# Patient Record
Sex: Female | Born: 1950
Health system: Southern US, Community
[De-identification: ages and names within clinical notes are randomized; demographics above are authoritative.]

## PROBLEM LIST (undated history)

## (undated) DIAGNOSIS — M25562 Pain in left knee: Secondary | ICD-10-CM

## (undated) DIAGNOSIS — M25579 Pain in unspecified ankle and joints of unspecified foot: Secondary | ICD-10-CM

## (undated) DIAGNOSIS — M76899 Other specified enthesopathies of unspecified lower limb, excluding foot: Secondary | ICD-10-CM

## (undated) DIAGNOSIS — E785 Hyperlipidemia, unspecified: Secondary | ICD-10-CM

## (undated) DIAGNOSIS — D696 Thrombocytopenia, unspecified: Secondary | ICD-10-CM

## (undated) DIAGNOSIS — R202 Paresthesia of skin: Secondary | ICD-10-CM

## (undated) DIAGNOSIS — M79671 Pain in right foot: Secondary | ICD-10-CM

## (undated) DIAGNOSIS — M84369A Stress fracture, unspecified tibia and fibula, initial encounter for fracture: Secondary | ICD-10-CM

## (undated) DIAGNOSIS — C50919 Malignant neoplasm of unspecified site of unspecified female breast: Secondary | ICD-10-CM

## (undated) DIAGNOSIS — M79609 Pain in unspecified limb: Secondary | ICD-10-CM

## (undated) DIAGNOSIS — M2170 Unequal limb length (acquired), unspecified site: Secondary | ICD-10-CM

## (undated) DIAGNOSIS — M84350A Stress fracture, pelvis, initial encounter for fracture: Secondary | ICD-10-CM

## (undated) DIAGNOSIS — M7661 Achilles tendinitis, right leg: Secondary | ICD-10-CM

## (undated) DIAGNOSIS — T148XXA Other injury of unspecified body region, initial encounter: Secondary | ICD-10-CM

## (undated) DIAGNOSIS — H9319 Tinnitus, unspecified ear: Secondary | ICD-10-CM

## (undated) DIAGNOSIS — J4520 Mild intermittent asthma, uncomplicated: Secondary | ICD-10-CM

## (undated) DIAGNOSIS — H905 Unspecified sensorineural hearing loss: Secondary | ICD-10-CM

## (undated) DIAGNOSIS — M25561 Pain in right knee: Secondary | ICD-10-CM

## (undated) DIAGNOSIS — M7650 Patellar tendinitis, unspecified knee: Secondary | ICD-10-CM

## (undated) DIAGNOSIS — S86809A Unspecified injury of other muscle(s) and tendon(s) at lower leg level, unspecified leg, initial encounter: Secondary | ICD-10-CM

## (undated) DIAGNOSIS — C4490 Unspecified malignant neoplasm of skin, unspecified: Secondary | ICD-10-CM

## (undated) DIAGNOSIS — M2040 Other hammer toe(s) (acquired), unspecified foot: Secondary | ICD-10-CM

## (undated) DIAGNOSIS — J309 Allergic rhinitis, unspecified: Secondary | ICD-10-CM

## (undated) DIAGNOSIS — M76829 Posterior tibial tendinitis, unspecified leg: Secondary | ICD-10-CM

## (undated) DIAGNOSIS — E7800 Pure hypercholesterolemia, unspecified: Secondary | ICD-10-CM

## (undated) DIAGNOSIS — G4700 Insomnia, unspecified: Secondary | ICD-10-CM

## (undated) DIAGNOSIS — M84376A Stress fracture, unspecified foot, initial encounter for fracture: Secondary | ICD-10-CM

## (undated) DIAGNOSIS — R200 Anesthesia of skin: Secondary | ICD-10-CM

## (undated) DIAGNOSIS — R079 Chest pain, unspecified: Secondary | ICD-10-CM

## (undated) DIAGNOSIS — J45909 Unspecified asthma, uncomplicated: Secondary | ICD-10-CM

## (undated) DIAGNOSIS — M7582 Other shoulder lesions, left shoulder: Secondary | ICD-10-CM

## (undated) HISTORY — DX: Stress fracture, pelvis, initial encounter for fracture: M84.350A

## (undated) HISTORY — PX: MANDIBLE FRACTURE SURGERY: SHX706

## (undated) HISTORY — DX: Unspecified sensorineural hearing loss: H90.5

## (undated) HISTORY — DX: Other shoulder lesions, left shoulder: M75.82

## (undated) HISTORY — DX: Pain in unspecified ankle and joints of unspecified foot: M25.579

## (undated) HISTORY — DX: Chest pain, unspecified: R07.9

## (undated) HISTORY — PX: NASAL SINUS SURGERY: SHX719

## (undated) HISTORY — DX: Posterior tibial tendinitis, unspecified leg: M76.829

## (undated) HISTORY — PX: TONSILLECTOMY: SUR1361

## (undated) HISTORY — PX: CATARACT EXTRACTION: SUR2

## (undated) HISTORY — DX: Other specified enthesopathies of unspecified lower limb, excluding foot: M76.899

## (undated) HISTORY — DX: Other injury of unspecified body region, initial encounter: T14.8XXA

## (undated) HISTORY — DX: Anesthesia of skin: R20.0

## (undated) HISTORY — DX: Hyperlipidemia, unspecified: E78.5

## (undated) HISTORY — DX: Stress fracture, unspecified foot, initial encounter for fracture: M84.376A

## (undated) HISTORY — DX: Patellar tendinitis, unspecified knee: M76.50

## (undated) HISTORY — DX: Pain in right foot: M79.671

## (undated) HISTORY — PX: ABDOMINAL SURGERY: SHX537

## (undated) HISTORY — DX: Unspecified injury of other muscle(s) and tendon(s) at lower leg level, unspecified leg, initial encounter: S86.809A

## (undated) HISTORY — DX: Achilles tendinitis, right leg: M76.61

## (undated) HISTORY — DX: Unspecified malignant neoplasm of skin, unspecified: C44.90

## (undated) HISTORY — DX: Thrombocytopenia, unspecified: D69.6

## (undated) HISTORY — DX: Insomnia, unspecified: G47.00

## (undated) HISTORY — DX: Tinnitus, unspecified ear: H93.19

## (undated) HISTORY — DX: Stress fracture, unspecified tibia and fibula, initial encounter for fracture: M84.369A

## (undated) HISTORY — PX: KNEE SURGERY: SHX244

## (undated) HISTORY — DX: Unspecified asthma, uncomplicated: J45.909

## (undated) HISTORY — DX: Allergic rhinitis, unspecified: J30.9

## (undated) HISTORY — DX: Pain in right knee: M25.561

## (undated) HISTORY — DX: Pain in unspecified limb: M79.609

## (undated) HISTORY — DX: Mild intermittent asthma, uncomplicated: J45.20

## (undated) HISTORY — DX: Unequal limb length (acquired), unspecified site: M21.70

## (undated) HISTORY — DX: Other hammer toe(s) (acquired), unspecified foot: M20.40

## (undated) HISTORY — DX: Pain in left knee: M25.562

## (undated) HISTORY — DX: Paresthesia of skin: R20.2

## (undated) HISTORY — DX: Malignant neoplasm of unspecified site of unspecified female breast: C50.919

---

## 1999-12-28 ENCOUNTER — Emergency Department (HOSPITAL_COMMUNITY): Admission: EM | Admit: 1999-12-28 | Discharge: 1999-12-28 | Payer: Self-pay | Admitting: *Deleted

## 1999-12-28 ENCOUNTER — Encounter: Payer: Self-pay | Admitting: Emergency Medicine

## 2002-08-13 ENCOUNTER — Encounter: Payer: Self-pay | Admitting: Obstetrics and Gynecology

## 2002-08-13 ENCOUNTER — Encounter: Admission: RE | Admit: 2002-08-13 | Discharge: 2002-08-13 | Payer: Self-pay | Admitting: Obstetrics and Gynecology

## 2003-01-29 ENCOUNTER — Other Ambulatory Visit: Admission: RE | Admit: 2003-01-29 | Discharge: 2003-01-29 | Payer: Self-pay | Admitting: Obstetrics and Gynecology

## 2003-02-26 ENCOUNTER — Encounter: Admission: RE | Admit: 2003-02-26 | Discharge: 2003-02-26 | Payer: Self-pay | Admitting: Obstetrics and Gynecology

## 2003-02-26 ENCOUNTER — Encounter: Payer: Self-pay | Admitting: Obstetrics and Gynecology

## 2004-02-16 ENCOUNTER — Other Ambulatory Visit: Admission: RE | Admit: 2004-02-16 | Discharge: 2004-02-16 | Payer: Self-pay | Admitting: Obstetrics and Gynecology

## 2004-02-27 ENCOUNTER — Encounter: Admission: RE | Admit: 2004-02-27 | Discharge: 2004-02-27 | Payer: Self-pay | Admitting: Obstetrics and Gynecology

## 2005-01-22 ENCOUNTER — Encounter: Admission: RE | Admit: 2005-01-22 | Discharge: 2005-01-22 | Payer: Self-pay | Admitting: Orthopedic Surgery

## 2005-03-08 ENCOUNTER — Encounter: Admission: RE | Admit: 2005-03-08 | Discharge: 2005-03-08 | Payer: Self-pay | Admitting: Obstetrics and Gynecology

## 2005-03-23 ENCOUNTER — Other Ambulatory Visit: Admission: RE | Admit: 2005-03-23 | Discharge: 2005-03-23 | Payer: Self-pay | Admitting: Obstetrics and Gynecology

## 2005-12-02 ENCOUNTER — Encounter: Admission: RE | Admit: 2005-12-02 | Discharge: 2005-12-02 | Payer: Self-pay | Admitting: Obstetrics and Gynecology

## 2006-03-23 ENCOUNTER — Encounter: Admission: RE | Admit: 2006-03-23 | Discharge: 2006-03-23 | Payer: Self-pay | Admitting: Obstetrics and Gynecology

## 2006-10-12 ENCOUNTER — Ambulatory Visit: Payer: Self-pay | Admitting: Family Medicine

## 2006-10-12 DIAGNOSIS — J45909 Unspecified asthma, uncomplicated: Secondary | ICD-10-CM

## 2006-10-12 DIAGNOSIS — J309 Allergic rhinitis, unspecified: Secondary | ICD-10-CM

## 2006-10-12 HISTORY — DX: Allergic rhinitis, unspecified: J30.9

## 2006-10-12 HISTORY — DX: Unspecified asthma, uncomplicated: J45.909

## 2006-10-13 DIAGNOSIS — M2040 Other hammer toe(s) (acquired), unspecified foot: Secondary | ICD-10-CM

## 2006-10-13 HISTORY — DX: Other hammer toe(s) (acquired), unspecified foot: M20.40

## 2006-10-18 ENCOUNTER — Ambulatory Visit: Payer: Self-pay | Admitting: Sports Medicine

## 2007-03-26 ENCOUNTER — Encounter: Admission: RE | Admit: 2007-03-26 | Discharge: 2007-03-26 | Payer: Self-pay | Admitting: Obstetrics and Gynecology

## 2007-03-29 ENCOUNTER — Encounter: Admission: RE | Admit: 2007-03-29 | Discharge: 2007-03-29 | Payer: Self-pay | Admitting: Obstetrics and Gynecology

## 2007-06-08 ENCOUNTER — Ambulatory Visit: Payer: Self-pay | Admitting: Sports Medicine

## 2007-06-08 ENCOUNTER — Ambulatory Visit (HOSPITAL_COMMUNITY): Admission: RE | Admit: 2007-06-08 | Discharge: 2007-06-08 | Payer: Self-pay | Admitting: Family Medicine

## 2007-06-13 ENCOUNTER — Telehealth (INDEPENDENT_AMBULATORY_CARE_PROVIDER_SITE_OTHER): Payer: Self-pay | Admitting: Family Medicine

## 2007-06-20 ENCOUNTER — Ambulatory Visit: Payer: Self-pay | Admitting: Sports Medicine

## 2007-07-17 ENCOUNTER — Ambulatory Visit: Payer: Self-pay | Admitting: Sports Medicine

## 2007-07-31 ENCOUNTER — Ambulatory Visit: Payer: Self-pay | Admitting: Sports Medicine

## 2007-08-03 ENCOUNTER — Ambulatory Visit (HOSPITAL_COMMUNITY): Admission: RE | Admit: 2007-08-03 | Discharge: 2007-08-03 | Payer: Self-pay | Admitting: Sports Medicine

## 2007-08-03 ENCOUNTER — Telehealth (INDEPENDENT_AMBULATORY_CARE_PROVIDER_SITE_OTHER): Payer: Self-pay | Admitting: *Deleted

## 2007-08-29 ENCOUNTER — Ambulatory Visit: Payer: Self-pay | Admitting: Sports Medicine

## 2007-08-29 DIAGNOSIS — M84350A Stress fracture, pelvis, initial encounter for fracture: Secondary | ICD-10-CM

## 2007-08-29 HISTORY — DX: Stress fracture, pelvis, initial encounter for fracture: M84.350A

## 2007-10-02 ENCOUNTER — Ambulatory Visit: Payer: Self-pay | Admitting: Sports Medicine

## 2007-10-10 ENCOUNTER — Ambulatory Visit: Payer: Self-pay | Admitting: Gastroenterology

## 2007-10-19 ENCOUNTER — Ambulatory Visit: Payer: Self-pay | Admitting: Gastroenterology

## 2007-10-30 ENCOUNTER — Ambulatory Visit: Payer: Self-pay | Admitting: Sports Medicine

## 2007-11-06 ENCOUNTER — Ambulatory Visit: Payer: Self-pay | Admitting: Sports Medicine

## 2007-11-22 ENCOUNTER — Telehealth: Payer: Self-pay | Admitting: Sports Medicine

## 2007-11-23 ENCOUNTER — Ambulatory Visit: Payer: Self-pay | Admitting: Sports Medicine

## 2007-12-12 ENCOUNTER — Ambulatory Visit: Payer: Self-pay | Admitting: Sports Medicine

## 2008-01-08 ENCOUNTER — Ambulatory Visit: Payer: Self-pay | Admitting: Sports Medicine

## 2008-01-22 ENCOUNTER — Emergency Department (HOSPITAL_COMMUNITY): Admission: EM | Admit: 2008-01-22 | Discharge: 2008-01-22 | Payer: Self-pay | Admitting: Family Medicine

## 2008-02-07 ENCOUNTER — Ambulatory Visit: Payer: Self-pay | Admitting: Sports Medicine

## 2008-02-07 DIAGNOSIS — C4490 Unspecified malignant neoplasm of skin, unspecified: Secondary | ICD-10-CM | POA: Insufficient documentation

## 2008-02-07 DIAGNOSIS — Z853 Personal history of malignant neoplasm of breast: Secondary | ICD-10-CM | POA: Insufficient documentation

## 2008-02-07 DIAGNOSIS — C50919 Malignant neoplasm of unspecified site of unspecified female breast: Secondary | ICD-10-CM | POA: Insufficient documentation

## 2008-02-07 HISTORY — DX: Unspecified malignant neoplasm of skin, unspecified: C44.90

## 2008-02-07 HISTORY — DX: Malignant neoplasm of unspecified site of unspecified female breast: C50.919

## 2008-03-11 ENCOUNTER — Ambulatory Visit: Payer: Self-pay | Admitting: Sports Medicine

## 2008-03-31 ENCOUNTER — Encounter: Admission: RE | Admit: 2008-03-31 | Discharge: 2008-03-31 | Payer: Self-pay | Admitting: Obstetrics and Gynecology

## 2008-04-07 ENCOUNTER — Ambulatory Visit: Payer: Self-pay | Admitting: Sports Medicine

## 2008-05-12 ENCOUNTER — Ambulatory Visit: Payer: Self-pay | Admitting: Sports Medicine

## 2008-06-05 ENCOUNTER — Ambulatory Visit: Payer: Self-pay | Admitting: Sports Medicine

## 2008-09-18 ENCOUNTER — Ambulatory Visit: Payer: Self-pay | Admitting: Sports Medicine

## 2008-09-18 DIAGNOSIS — M2170 Unequal limb length (acquired), unspecified site: Secondary | ICD-10-CM

## 2008-09-18 HISTORY — DX: Unequal limb length (acquired), unspecified site: M21.70

## 2008-10-20 ENCOUNTER — Emergency Department (HOSPITAL_BASED_OUTPATIENT_CLINIC_OR_DEPARTMENT_OTHER): Admission: EM | Admit: 2008-10-20 | Discharge: 2008-10-20 | Payer: Self-pay | Admitting: Emergency Medicine

## 2009-01-13 ENCOUNTER — Ambulatory Visit: Payer: Self-pay | Admitting: Sports Medicine

## 2009-04-01 ENCOUNTER — Encounter: Admission: RE | Admit: 2009-04-01 | Discharge: 2009-04-01 | Payer: Self-pay | Admitting: Obstetrics and Gynecology

## 2009-06-15 ENCOUNTER — Ambulatory Visit: Payer: Self-pay | Admitting: Sports Medicine

## 2009-06-15 ENCOUNTER — Encounter: Payer: Self-pay | Admitting: Sports Medicine

## 2009-06-15 DIAGNOSIS — M84369A Stress fracture, unspecified tibia and fibula, initial encounter for fracture: Secondary | ICD-10-CM

## 2009-06-15 HISTORY — DX: Stress fracture, unspecified tibia and fibula, initial encounter for fracture: M84.369A

## 2009-06-29 ENCOUNTER — Ambulatory Visit: Payer: Self-pay | Admitting: Sports Medicine

## 2009-07-15 ENCOUNTER — Encounter: Payer: Self-pay | Admitting: Sports Medicine

## 2009-07-15 ENCOUNTER — Ambulatory Visit: Payer: Self-pay | Admitting: Sports Medicine

## 2009-08-12 ENCOUNTER — Ambulatory Visit: Payer: Self-pay | Admitting: Sports Medicine

## 2009-08-12 DIAGNOSIS — M76829 Posterior tibial tendinitis, unspecified leg: Secondary | ICD-10-CM

## 2009-08-12 HISTORY — DX: Posterior tibial tendinitis, unspecified leg: M76.829

## 2009-09-22 ENCOUNTER — Ambulatory Visit: Payer: Self-pay | Admitting: Sports Medicine

## 2009-09-22 ENCOUNTER — Ambulatory Visit (HOSPITAL_COMMUNITY): Admission: RE | Admit: 2009-09-22 | Discharge: 2009-09-22 | Payer: Self-pay | Admitting: Sports Medicine

## 2009-09-22 DIAGNOSIS — M79609 Pain in unspecified limb: Secondary | ICD-10-CM

## 2009-09-22 HISTORY — DX: Pain in unspecified limb: M79.609

## 2009-09-23 ENCOUNTER — Encounter: Admission: RE | Admit: 2009-09-23 | Discharge: 2009-09-23 | Payer: Self-pay | Admitting: Sports Medicine

## 2009-09-23 ENCOUNTER — Encounter (INDEPENDENT_AMBULATORY_CARE_PROVIDER_SITE_OTHER): Payer: Self-pay | Admitting: *Deleted

## 2009-09-29 ENCOUNTER — Ambulatory Visit: Payer: Self-pay | Admitting: Sports Medicine

## 2009-10-20 ENCOUNTER — Ambulatory Visit: Payer: Self-pay | Admitting: Sports Medicine

## 2009-12-02 ENCOUNTER — Ambulatory Visit: Payer: Self-pay | Admitting: Sports Medicine

## 2010-04-05 ENCOUNTER — Encounter: Admission: RE | Admit: 2010-04-05 | Discharge: 2010-04-05 | Payer: Self-pay | Admitting: Obstetrics and Gynecology

## 2010-04-27 ENCOUNTER — Ambulatory Visit: Payer: Self-pay | Admitting: Sports Medicine

## 2010-04-27 DIAGNOSIS — M76899 Other specified enthesopathies of unspecified lower limb, excluding foot: Secondary | ICD-10-CM | POA: Insufficient documentation

## 2010-04-27 HISTORY — DX: Other specified enthesopathies of unspecified lower limb, excluding foot: M76.899

## 2010-05-25 ENCOUNTER — Ambulatory Visit
Admission: RE | Admit: 2010-05-25 | Discharge: 2010-05-25 | Payer: Self-pay | Source: Home / Self Care | Attending: Sports Medicine | Admitting: Sports Medicine

## 2010-05-30 ENCOUNTER — Encounter: Payer: Self-pay | Admitting: Sports Medicine

## 2010-06-08 NOTE — Miscellaneous (Signed)
Summary: BONE SCAN APPT  Clinical Lists Changes  Orders: Added new Test order of Radiology other (Radiology Other) - Signed

## 2010-06-08 NOTE — Assessment & Plan Note (Signed)
Summary: F/U,MC   Vital Signs:  Patient profile:   60 year old female BP sitting:   134 / 72  Vitals Entered By: Lillia Pauls CMA (August 12, 2009 8:48 AM)  Primary Provider:  Sports Medicine   History of Present Illness: Had early Tib stress fx 06/15/09 able to recover in 1 month with use of aircast went back to running fairly consistently [place] this past weekend and the tibia became a lot sorer but more over lat shin now and not area of previous stress fx pain started on 1 mile uphill section of race and was fairly severe w cramping  now sore over ant lat shin no limp to walk  Allergies: 1)  ! Aspirin (Aspirin) 2)  ! Nsaids 3)  * Acetaminophen  Physical Exam  General:  Well-developed,well-nourished,in no acute distress; alert,appropriate and cooperative throughout examination Msk:  Pain to day is located directly over RT  tib ant mm and perhaps  ext digitorum This is slightly tender to palpation and appears slt swollen vs Lt side  strength testing cause min discomfort  hop test is negative  percussion to tibia is neg  no pain at medial border now   Impression & Recommendations:  Problem # 1:  TIBIALIS TENDINITIS (ICD-726.72) seems priamrily ant tib tendonitis will give therabenad for rehab and instructed in walking rehab exercises  xtrain x 1 week ease back into running   no hills for first 2 wks  Problem # 2:  STRESS FRACTURE, TIBIA (ICD-733.93) This does not seem to be flared again at this point  with neg hop test can resume some running and training afer MM less sore  Complete Medication List: 1)  Tramadol Hcl 50 Mg Tabs (Tramadol hcl) .... Take one tablet every 4-6 hours as needed pain. 2)  Voltaren 1 % Gel (Diclofenac sodium) .... Apply 3 g to lateral knee 4 times a day; disp 100 g tube 3)  Evamist 1.53 Mg/spray Soln (Estradiol)  Appended Document: F/U,MC

## 2010-06-08 NOTE — Assessment & Plan Note (Signed)
Summary: F/U,MC   Vital Signs:  Patient profile:   60 year old female BP sitting:   120 / 74  Vitals Entered By: Lillia Pauls CMA (Sep 29, 2009 11:10 AM)  Primary Provider:  Sports Medicine   History of Present Illness: Now has taken off 9 days has seen improvement in past 2 days in leg pain some pain when she was on leg for 16 hours  Does not want to do bone scan  would rather try period of rest  notes some swelling in ankle but much less    Allergies: 1)  ! Aspirin (Aspirin) 2)  ! Nsaids 3)  * Acetaminophen  Physical Exam  General:  Well-developed,well-nourished,in no acute distress; alert,appropriate and cooperative throughout examination Msk:  nontender over tibia now x one area in mid ant tibia that has mild pain on percussion no swelling or discoloration in this area  RT ankle shows very slt swelling now in sinus tarsi  stable lateral and medial ligaments; squeeze test and kleiger test unremarkable; talar dome seems nontender; no sign of peroneal tendon subluxations; no pain at base of 5th MT.    Impression & Recommendations:  Problem # 1:  LEG PAIN, RIGHT (ICD-729.5) This still could be stress fx or stress rxn over bone  with norm xrays I am OK w period of rest  if she improves we can gradually return her to running  no tendinopathy noted on exam now  she likely needs a custom orthotic w recurrent leg pain and plan  to run a marathon in fall  Problem # 2:  FAMILY HISTORY OSTEOPOROSIS (ICD-V17.81) has very good T score and reviewed this w her  Complete Medication List: 1)  Tramadol Hcl 50 Mg Tabs (Tramadol hcl) .... Take one tablet every 4-6 hours as needed pain. 2)  Voltaren 1 % Gel (Diclofenac sodium) .... Apply 3 g to lateral knee 4 times a day; disp 100 g tube 3)  Evamist 1.53 Mg/spray Soln (Estradiol)  Patient Instructions: 1)  June 14 we reck 2)  if no bone related pain can staert running outdoors 3)  start back at run/ walk for first 2  wks 4)  now until then 5)  week 1 only bike, swim, elliptical 6)  week 2 OK to add TM if no pain - easy level 7)  still ice any sore areas 8)  in meantime - weight exercises 9)  runners squat 10)  runners lunge 11)  heel raise on step 12)  lateral and straight leg lifts

## 2010-06-08 NOTE — Assessment & Plan Note (Signed)
Summary: F/U,MC   Primary Provider:  Sports Medicine   History of Present Illness: Ran half marathon this past wk only able to go 2 miles before sharp pain in ankle swelled this had hurt a couple of months back but had not been troublesome since this area had not hurt enough to affect running before for past few mos has been walk running - 15 mins and then walk 2 able to finish 6 miles total on most runs  ant tib tendon did not hurt ant tibia higher is getting sharp pain body helix seems too tight over RT calf and shin  Allergies: 1)  ! Aspirin (Aspirin) 2)  ! Nsaids 3)  * Acetaminophen  Family History: Heart Disease in sister at age 94 Family History Diabetes 1st degree relative (Mo & Fa, onset in their 89's) Family History High cholesterol (Parents and Sister, onset in their 43's) High Blood pressure in Sister (onset in her 36's) Cancers in grandmother, mother sister  mother has severe osteoporosis and 2 sisters have this  Physical Exam  General:  Well-developed,well-nourished,in no acute distress; alert,appropriate and cooperative throughout examination Msk:  RT ant ankle swelling over sinus tarsi mild long arch collapse  ankle shows no swelling medially; stable lateral and medial ligaments; squeeze test and kleiger test unremarkable; talar dome seems nontender; no sign of peroneal tendon subluxations; no pain at base of 5th MT. resisted dorsiflexion w foot in Plantar flexion hurts laterally ankle dorsiflexion nuetrally non tender tib ant non tender    Impression & Recommendations:  Problem # 1:  LEG PAIN, RIGHT (ICD-729.5)  I think we need to xray tibia and RT ankle  also good to get BMD w strong hx osteoporosis  also hx of being on tamoxifen for breast Ca  relative rest until DX established  Orders: Radiology other (Radiology Other)  Problem # 2:  TIBIALIS TENDINITIS (ICD-726.72) this seems resolved testing  Problem # 3:  STRESS FRACTURE, TIBIA  (ICD-733.93)  i wonder if this completely healed  if xrays neg will follow w bone scan  Orders: Radiology other (Radiology Other) Radiology other (Radiology Other)  Complete Medication List: 1)  Tramadol Hcl 50 Mg Tabs (Tramadol hcl) .... Take one tablet every 4-6 hours as needed pain. 2)  Voltaren 1 % Gel (Diclofenac sodium) .... Apply 3 g to lateral knee 4 times a day; disp 100 g tube 3)  Evamist 1.53 Mg/spray Soln (Estradiol)

## 2010-06-08 NOTE — Assessment & Plan Note (Signed)
Summary: RT LEG POSSIABLE STRESS FRACTURE,MC   Primary Provider:  Sports Medicine   History of Present Illness: R anterior calf pain x1 week. will wake her up at night. worse with dress shoes. pain even with fast paced walking. has not increased mileage of runs. was running and felt sharp pain just distal to knee, and saw bruise afterwards. able to walk on it, but that is about it. about 3 days ago, pain was so severe it impacted even walking. last stress fracture was January 2009.   Allergies: 1)  ! Aspirin (Aspirin) 2)  ! Nsaids 3)  * Acetaminophen  Past History:  Past Medical History: Allergic rhinitis (Seasonal) Asthma (Seasonal) Cancer Breast (Type??)  recent abdominal mass removed  Physical Exam  General:  Well-developed,well-nourished,in no acute distress; alert,appropriate and cooperative throughout examination Msk:  +TTP R anterior and medial  tibia.   + TT percussion  no swelling  gait shows mild antalgic limp Additional Exam:  MS[facility] [place]  Evette Cristal is visualized over area of greatest tenderness thaer is localized swelling some slight increase in doppler flow small cortical irregulariy  all are cons with stress fx   Impression & Recommendations:  Problem # 1:  STRESS FRACTURE, TIBIA (ICD-733.93) Assessment New aircast x2 weeks, then return to clinic for re-evaluation.   biking OK no running  add ca, vit D and vit C  Complete Medication List: 1)  Tramadol Hcl 50 Mg Tabs (Tramadol hcl) .... Take one tablet every 4-6 hours as needed pain. 2)  Voltaren 1 % Gel (Diclofenac sodium) .... Apply 3 g to lateral knee 4 times a day; disp 100 g tube 3)  Evamist 1.53 Mg/spray Soln (Estradiol)  Other Orders: Aircast Leg brace ([id number])

## 2010-06-08 NOTE — Assessment & Plan Note (Signed)
Summary: F/U,MC   Vital Signs:  Patient profile:   60 year old female Pulse rate:   61 / minute BP sitting:   127 / 79  (left arm)  Vitals Entered By: Terese Door (June 29, 2009 11:03 AM) CC: F/U Stress Fx   Primary Provider:  Sports Medicine  CC:  F/U Stress Fx.  History of Present Illness: 60 y/o female evaluated 2 weeks ago for R tibial stress fracture here for f/u. reports improvement in symptoms. has been wearing air cast. very concerned about loss of conditioning in legs. has been riding stationary bike. patient doesn't awake from sleeping 2/2 pain.   this prob started with changing running gait 2/2 some left hip strain RT med and ant shin were painful enough to cause limp  within 3 days of using aircast no real limping now  Allergies: 1)  ! Aspirin (Aspirin) 2)  ! Nsaids 3)  * Acetaminophen  Physical Exam  General:  Well-developed,well-nourished,in no acute distress; alert,appropriate and cooperative throughout examination Msk:  minimal TTP of R anterior and medial tibia no swelling. no TT percussion  good form with walking/running using air splint   Impression & Recommendations:  Problem # 1:  STRESS FRACTURE, TIBIA (ICD-733.93) Assessment Improved likely Grade I. will use accelerated program for return to full exercise (see patient instructions). if any pain/swelling, patient to decrease intensity of work out. follow up in 2-3 weeks.   ck repeat scan on RTC  Complete Medication List: 1)  Tramadol Hcl 50 Mg Tabs (Tramadol hcl) .... Take one tablet every 4-6 hours as needed pain. 2)  Voltaren 1 % Gel (Diclofenac sodium) .... Apply 3 g to lateral knee 4 times a day; disp 100 g tube 3)  Evamist 1.53 Mg/spray Soln (Estradiol)  Patient Instructions: 1)  walk run program  2)  first 2 days only 20 mins  3 run/ 2 walk 3)  If OK 4)  then 30 mins for 1 week 3 run/ 2 walk 5)  Then 35 mins 4 run 1 walk 6)  then see me 7)  ice after all

## 2010-06-08 NOTE — Assessment & Plan Note (Signed)
Summary: F/U,MC   Vital Signs:  Patient profile:   60 year old female BP sitting:   116 / 69  Vitals Entered By: Lillia Pauls CMA (October 20, 2009 1:36 PM)  Primary Care Provider:  Sports Medicine   History of Present Illness: 60 yo F here for f/u R lower leg pain  Patient dx with R ant tibia stress fx 06/15/09 Progressed on running program and did fairly well until about a month ago Ran a half marathon and had sharp pain in R ankle about 2 miles into this - + swelling Also with sharp pain in tibia. Last visit had taken 9 days off and has not run since LOV. States has no pain in right leg Tried aircast previously and running in this and caused pain in left shin Unable to take tylenol or NSAIDs 2/2 allergic reaction Tried custom orthotics and comforthotics and these felt uncomfortable - blisters and did not improve pain.  Willing to try comforthotics again Bodyhelix felt too tight on shin/calf. Had Dexa that was normal - refused bone scan  Allergies (verified): 1)  ! Aspirin (Aspirin) 2)  ! Nsaids 3)  * Acetaminophen  Physical Exam  General:  Well-developed,well-nourished,in no acute distress; alert,appropriate and cooperative throughout examination Msk:  RLE: No swelling, bruising, deformity No TTP about tibia, ankle, or foot. Negative hop, load, and fulcrum tests Ankle Ligaments intact   Impression & Recommendations:  Problem # 1:  LEG PAIN, RIGHT (ICD-729.5) Assessment Improved Improved but concerns that last time she increased her activity pain recurred.  Stressed importance of slowly increasing jog time and total activity time.  Does not want to use aircast to work back into running given problems in past.  Willing to try comforthotics to help with impact but no adjustments and not custom orthotics.  Allergic to tylenol and nsaids - can try topical flexall/aspercreme if needed and ice after runs.  If pain increases with activity (see instructions), needs to back off.   Cross train on off days. F/u in 3 weeks for recheck.  Orders: Sports Insoles [phone])  Complete Medication List: 1)  Tramadol Hcl 50 Mg Tabs (Tramadol hcl) .... Take one tablet every 4-6 hours as needed pain. 2)  Voltaren 1 % Gel (Diclofenac sodium) .... Apply 3 g to lateral knee 4 times a day; disp 100 g tube 3)  Evamist 1.53 Mg/spray Soln (Estradiol)  Patient Instructions: 1)  Start with walk:jog again 1:1 minute for 15-20 minutes tomorrow.  Increase every other day by 5 minutes and jog interval (by 2 minutes at a time). 2)  Cross train on off days. 3)  Follow up with [place] in 3 weeks. 4)  If you have an increase in pain you need to back down or stop running and focus on cross training. 5)  Ice as needed. 6)  You can try topical flexall 454 or aspercreme and see if this will help 7)  Try the green insoles again to help with impact.

## 2010-06-08 NOTE — Assessment & Plan Note (Signed)
Summary: R SHIN PAIN X Jun 11, 2009   Vital Signs:  Patient profile:   60 year old female BP sitting:   116 / 69  Vitals Entered By: Lillia Pauls CMA (June 15, 2009 9:23 AM)  Primary Provider:  Sports Medicine   History of Present Illness: Duplicate  see otehr visit note for this day  Allergies: 1)  ! Aspirin (Aspirin) 2)  ! Nsaids 3)  * Acetaminophen   Complete Medication List: 1)  Tramadol Hcl 50 Mg Tabs (Tramadol hcl) .... Take one tablet every 4-6 hours as needed pain. 2)  Voltaren 1 % Gel (Diclofenac sodium) .... Apply 3 g to lateral knee 4 times a day; disp 100 g tube

## 2010-06-08 NOTE — Assessment & Plan Note (Signed)
Summary: FU R TIB FX   Vital Signs:  Patient profile:   60 year old female Height:      60 inches Weight:      125 pounds BMI:     24.50 BP sitting:   124 / 69  Vitals Entered By: Lillia Pauls CMA (July 15, 2009 9:14 AM)  Primary Provider:  Sports Medicine   History of Present Illness: Reports to f/u right tib stress reaction. Has discontinued air cast given pain improvement. Running per advised protocol. Significantly decreased, intermittent pain during runs. Lately having some intermittent ant tib muscle pain on runs. No numbness, tingling, or weakness. Successfully completed a 5K (mix of walking and running) within the past week. No pain during- or after run.  Allergies: 1)  ! Aspirin (Aspirin) 2)  ! Nsaids 3)  * Acetaminophen  Physical Exam  General:  Well-developed,well-nourished,in no acute distress; alert,appropriate and cooperative throughout examination Msk:  KNEES:  FROM. No ttp/swelling/discoloration.  TIB/FIB: - Mild ttp of proximal ant tib muscle. - No respective swelling, discoloration, or increased warmth. - Decreased tibia ttp. - No pain on running.  ANKLES/FEET: FROM. Full strength. Neurologic:  Normal nv examination.   Impression & Recommendations:  Problem # 1:  STRESS FRACTURE, TIBIA (ICD-733.93) Assessment Improved Right Tib Stress Reaction  - Per patient instructions. - RTC in 4 weeks or sooner as needed for pain or any other concerns.  Complete Medication List: 1)  Tramadol Hcl 50 Mg Tabs (Tramadol hcl) .... Take one tablet every 4-6 hours as needed pain. 2)  Voltaren 1 % Gel (Diclofenac sodium) .... Apply 3 g to lateral knee 4 times a day; disp 100 g tube 3)  Evamist 1.53 Mg/spray Soln (Estradiol)  Patient Instructions: 1)  Twice Weekly Exercises: 2)  1) Drop Squats - 3 sets of 15 as tolerated. 3)  2) Running Lunges - 3 sets of 15 as tolerated 4)  Daily Exercises: 5)  1) Heel Walks - Across the room 10 times. 6)  2) Toe  Walks - Across the room 10 times. 7)  3) Pidgeon-toe Walks - Across the room 10 times. 8)  Please do not hesitate to contact us as needed for pain or any other concerns.

## 2010-06-08 NOTE — Assessment & Plan Note (Signed)
Summary: FU/MJD   Vital Signs:  Patient profile:   60 year old female Pulse rate:   76 / minute BP sitting:   118 / 77  (left arm)  Vitals Entered By: Terese Door (December 02, 2009 10:02 AM) CC: F/U leg pain   Primary Provider:  Sports Medicine  CC:  F/U leg pain.  History of Present Illness: Now running 3x per wk having to care for husband who was in serious motorcycle accident  RT leg pain is less but mileage is down 3 miles x 2 and 5 to 6 miles x 1 per week now  no tenderness to ant leg to touch  some pain on left foot  Recent BMD was OK  Allergies: 1)  ! Aspirin (Aspirin) 2)  ! Nsaids 3)  * Acetaminophen  Physical Exam  General:  Well-developed,well-nourished,in no acute distress; alert,appropriate and cooperative throughout examination Msk:  RT leg no swelling/ no TTP neg percussion test over tibia  Left foot TTP at per brevis tendon insertion foot is fairly neutral w some loss of long arch  walking gait is normal   Impression & Recommendations:  Problem # 1:  LEG PAIN, RIGHT (ICD-729.5) This is resolving and will follow use cushioned insoles  keep training easy until stress resolves  keep using ice  Complete Medication List: 1)  Tramadol Hcl 50 Mg Tabs (Tramadol hcl) .... Take one tablet every 4-6 hours as needed pain. 2)  Voltaren 1 % Gel (Diclofenac sodium) .... Apply 3 g to lateral knee 4 times a day; disp 100 g tube 3)  Evamist 1.53 Mg/spray Soln (Estradiol)

## 2010-06-10 NOTE — Assessment & Plan Note (Signed)
Summary: f/u hip,mc   Primary Provider:  Sports Medicine  CC:  lt hip pain.  History of Present Illness: Rhonda [name] presents to clinic today for f/u of her Lt hip pain.  She was seen by Dr. Darrick Penna for Lt trochanteric bursitis, and given tramadol and abductor strength exercises.  In the interum Rhonda [name] has not worsened. She continues to have pain with running but is able to complete her milage. She runs about 10-15 miles per week but will soon start training for a marathon. She does not have much pain if any at rest.  She notes that her work is extremly time consuming currently with the implimentation of [employer] at [employer]. She is sleeping around 3-4 hous per night and not able to complete her cross training or hip strength exercises. She does not take NSAIDs and is afraid of needles.   Current Problems (verified): 1)  Trochanteric Bursitis, Left  (ICD-726.5) 2)  Family History Osteoporosis  (ICD-V17.81) 3)  Leg Pain, Right  (ICD-729.5) 4)  Tibialis Tendinitis  (ICD-726.72) 5)  Stress Fracture, Tibia  (ICD-733.93) 6)  Unequal Leg Length  (ICD-736.81) 7)  Hx of Carcinoma, Basal Cell  (ICD-173.9) 8)  Hx of Malignant Neoplasm of Breast Unspecified Site  (ICD-174.9) 9)  Hx of Stress Fracture of Pelvis  (ICD-733.98) 10)  Hammer Toe, Other, Acquired  (ICD-735.4) 11)  Asthma  (ICD-493.90) 12)  Allergic Rhinitis  (ICD-477.9) 13)  Family History Diabetes 1st Degree Relative  (ICD-V18.0)  Current Medications (verified): 1)  Tramadol Hcl 50 Mg  Tabs (Tramadol Hcl) .... Take One Tablet Every 4-6 Hours As Needed Pain. 2)  Voltaren 1 % Gel (Diclofenac Sodium) .... Apply 3 G To Lateral Knee 4 Times A Day; Disp 100 G Tube 3)  Evamist 1.53 Mg/spray Soln (Estradiol) 4)  Tramadol Hcl 50 Mg Tabs (Tramadol Hcl) .Marland Kitchen.. 1 Tab Four Times Daily For Pain  Allergies (verified): 1)  ! Aspirin (Aspirin) 2)  ! Nsaids 3)  * Acetaminophen  Past History:  Past Medical History: Last updated:  06/15/2009 Allergic rhinitis (Seasonal) Asthma (Seasonal) Cancer Breast (Type??)  recent abdominal mass removed  Past Surgical History: Last updated: 04/07/2008 Tonsillectomy in 1960's Knee surgery twice Surgery for cancer excision in 04/2006 (type???) meniscus left knee 1986 medial PFS surgery left knee in 1990  Review of Systems  The patient denies anorexia, fever, and weight loss.    Physical Exam  General:  alert and well-developed.   Msk:  Hips: ROM Left >right non painful. Rigs ext rom to 20 deg. Normal hip flexion.  Knee rom is normal BL. Strength of hip is normal with exception of abduction which is 4+/5 BL.  Knee extension and flexion strength is normal Left hip is tender over greater trochanter.  Leg are = length.    Impression & Recommendations:  Problem # 1:  TROCHANTERIC BURSITIS, LEFT (ICD-726.5) Assessment Unchanged Continues to have bothersome Left Hip trochanteric bursitis.  She is not sleeping enough and she has not comleted the hip abductor strength exercises. Thus she has not healed.  Plan: Encourage Rhonda [name] to get at least 6 hours of sleep a night to heal. Also encourage her to take oportunities at work for hip abductor strength exercises.  If she does not improve or worsens she will return to clinic.  See instructions.   Complete Medication List: 1)  Tramadol Hcl 50 Mg Tabs (Tramadol hcl) .... Take one tablet every 4-6 hours as needed pain. 2)  Voltaren 1 %  Gel (Diclofenac sodium) .... Apply 3 g to lateral knee 4 times a day; disp 100 g tube 3)  Evamist 1.53 Mg/spray Soln (Estradiol) 4)  Tramadol Hcl 50 Mg Tabs (Tramadol hcl) .Marland Kitchen.. 1 tab four times daily for pain  Patient Instructions: 1)  Thank you for seeing me today. 2)  Please try to get some more sleep if able.  3)  Hip strength exercises such as step up drills and straight leg raise will help.  Please try to do these exercises.  4)  Ice packs after runs may help.  5)  If you cannot  complete the training for your race let us know.  6)  Come back in 4-6 weeks if not better.     Orders Added: 1)  Est. Patient Level III [28413]

## 2010-06-10 NOTE — Assessment & Plan Note (Signed)
Summary: HIP [facility]   Primary Provider:  Sports Medicine   History of Present Illness: Pt reports L hip pain  x 1 1/2 week. Pt notes acute worsening of pain wednesday of last week where pt  had to stop 10 mile run at 3 1/2 miles because of L hip pain. Pt then went for indoor walk/run next day and had to stop after 5 min because of severe pain. Could tolerate some pain when walking very slow. Noticed limp by bed time.pain has been waking pt up at night when laying on L side.  Pain located in hip joint vs. previously radiating down ITB. has not used NSAIDs/aspirinsecondary to severe allergic response in the past.    No distal LE weakness/numbness per pt.   Pt feels that this may be related to running shoes as they have become more worn.Of note, pt has went to several marathons over past 3-4 months: Sep 18th[place] ,Oct 2[place]; nov-[place]; [place] 1/2 marathon; [place] 1/2 marathon. Has worn insole supports in the past but they have hurt feet.   Allergies: 1)  ! Aspirin (Aspirin) 2)  ! Nsaids 3)  * Acetaminophen  Physical Exam  General:  alert and well-developed.   Msk:  Hip: ROM IR: 30 Deg, ER: 50 Deg, Flexion: 120 Deg, Extension: 100 Deg, Abduction: 45 Deg, Adduction: 45 Deg Strength IR: 5/5, ER: 5/5, Flexion: 5/5, Extension: 5/5, Abduction: 5/5, Adduction: 5/5 Pelvic alignment unremarkable to inspection  + tenderness to palpation over greater trochanter  Standing hip rotation and gait without trendelenburg / unsteadiness. No tenderness over piriformis  No SI joint tenderness and normal minimal SI movement.    Impression & Recommendations:  Problem # 1:  TROCHANTERIC BURSITIS, LEFT (ICD-726.5) Pt refusing steroid injection. Will rx ultram as well as icing. Discussed hip stabilization exercises. Overall mechanism of injury likely secondary to overuse as pt has had several marathons in the last 3-4 months. Encourgaed rest to help in  recovery. Gradually restart nrunning once pain level < 3/10  Will followup in 4-6 weeks.   Complete Medication List: 1)  Tramadol Hcl 50 Mg Tabs (Tramadol hcl) .... Take one tablet every 4-6 hours as needed pain. 2)  Voltaren 1 % Gel (Diclofenac sodium) .... Apply 3 g to lateral knee 4 times a day; disp 100 g tube 3)  Evamist 1.53 Mg/spray Soln (Estradiol) 4)  Tramadol Hcl 50 Mg Tabs (Tramadol hcl) .Marland Kitchen.. 1 tab four times daily for pain  Patient Instructions: 1)  You have trochanteric bursitis 2)  Use the ultram as directed for pain  3)  Use icing over area 4)  Perform the hip stabilzation exercises as directed  5)  Follow up in 4-6 weeks  6)  Merry Christmas  7)  Enid Baas MD  Prescriptions: TRAMADOL HCL 50 MG TABS (TRAMADOL HCL) 1 tab four times daily for pain  #120 x 3   Entered by:   Doree Albee MD   Authorized by:   Enid Baas MD   Signed by:   Doree Albee MD on 04/27/2010   Method used:   Print then Give to Patient   RxID:   [id number]    Orders Added: 1)  Est. Patient Level III [14782]

## 2010-06-22 ENCOUNTER — Ambulatory Visit: Payer: Self-pay | Admitting: Sports Medicine

## 2010-12-08 ENCOUNTER — Ambulatory Visit (INDEPENDENT_AMBULATORY_CARE_PROVIDER_SITE_OTHER): Payer: 59 | Admitting: Sports Medicine

## 2010-12-08 VITALS — BP 132/77 | HR 52

## 2010-12-08 DIAGNOSIS — M79609 Pain in unspecified limb: Secondary | ICD-10-CM

## 2010-12-08 NOTE — Assessment & Plan Note (Signed)
Does not seem to be related to hamstring or gluteus medius with physical findings, more likely mild tear of hip stabilizer muscles such as obturator.  Told pt to concentrate on core, and hip stabilization exercises. Pt given hamstring sleeve to try to try to unload the stabilizer muscles which could have been injured due to initial hamstring injury. Pt able to start training as tolerated, if worsen to come back.

## 2010-12-08 NOTE — Progress Notes (Signed)
  Subjective:    Patient ID: Rhonda [name], female    DOB: [birth year], 60 y.o.   MRN: [id number]  HPI 60 yo female here with hamstring and what she calls "gluteus medius pain".  Pt states she ran a half marathon in June and since then has had a little pain in her right hamstring and gluteus medius.  Pt has taken a full month off from  Running and the last week has started to train for another marathon she wants to run in November in [place].  Pt  States the hamstring has started to feel better but the other pain is still there, worse with walking or running or even sitting at the end of a day.  Pt denies any skin changes.  Pt states the pain is mostly in the buttocks area and points toward the ischium when asked.  Pt still able to do activty and would like to start training but wanted to be evaluated first.    Review of Systems Denies weight changes, fever, chills, weakness in extremities.     Objective:   Physical Exam Gen: NAD MSK: negtive SLT, neg FABER bilateral, pt has mild tightness of right hamstring.  Pt has strong abductors and adductors bilateral.  Pt does have moderate tenderness just lateral to the right ischium.  No edema no skin changes, pain illicite with extreme flexion of hip but full ROM of hip.         Assessment & Plan:    LEG PAIN, RIGHT Does not seem to be related to hamstring or gluteus medius with physical findings, more likely mild tear of hip stabilizer muscles such as obturator.  Told pt to concentrate on core, and hip stabilization exercises. Pt given hamstring sleeve to try to try to unload the stabilizer muscles which could have been injured due to initial hamstring injury. Pt able to start training as tolerated, if worsen to come back.

## 2010-12-29 ENCOUNTER — Ambulatory Visit (INDEPENDENT_AMBULATORY_CARE_PROVIDER_SITE_OTHER): Payer: 59 | Admitting: Family Medicine

## 2010-12-29 DIAGNOSIS — M25559 Pain in unspecified hip: Secondary | ICD-10-CM

## 2010-12-29 DIAGNOSIS — M79609 Pain in unspecified limb: Secondary | ICD-10-CM

## 2010-12-29 DIAGNOSIS — S76319A Strain of muscle, fascia and tendon of the posterior muscle group at thigh level, unspecified thigh, initial encounter: Secondary | ICD-10-CM

## 2010-12-29 DIAGNOSIS — IMO0002 Reserved for concepts with insufficient information to code with codable children: Secondary | ICD-10-CM

## 2010-12-29 DIAGNOSIS — M25551 Pain in right hip: Secondary | ICD-10-CM

## 2010-12-29 NOTE — Assessment & Plan Note (Signed)
The patient continues to have the same problem at this time. Patient has been very compliant with her exercises. Will attempt to some high compression shorts. At this time we'll also get an MRI to rule out any type of stress fractures. We will focus on the obturator muscle likely being because of the complaint. She is to return to clinic in 2 weeks if not better.

## 2010-12-29 NOTE — Patient Instructions (Addendum)
Nice to see you Try to get more high compression shorts We will get an MRI I am sorry to hear about your family Come back in 2-3 weeks if needed [facility] [place] [facility] 01, 2011 at 11am. Arrive at 10:45am. [phone].

## 2010-12-29 NOTE — Progress Notes (Signed)
  Subjective:    Patient ID: Rhonda [name], female    DOB: Aug 13, 1950, 61 y.o.   MRN: [id number]  HPI  60 yo female here with f/u of hamstring and what she calls "gluteus medius pain".  Pt had what was likely an obturator or hip stabilizer muscle injury.  Patient was given exercises to do as well as a hamstring sleeve.  Patient states she has started training for her next marathon a little more rigorously in the pain continues to be a problem patient states that the pain is superior to where the hamstring sleeve and touch.  Patient denies worsening of pain but has noticed that her hamstrings and quadriceps as well as calf muscle on the right side is continuing to give her a little more tightness. Patient states that she needs to know by September 5 if she is going to be able to run her marathon otherwise she would have to decrease to a half marathon which would not make her very happy. Patient would like some guidance today.   Original injury: Pt states she ran a half marathon in June and since then has had a little pain in her right hamstring and gluteus medius.     PERTINENT  PMH / PSH: She has listed history of breast mass but it's unclear whether or not this was malignant. She's also had a mass removed from her abdominal area but it's unclear exactly what that was either. Her last 5 years of mammograms have been normal. She was on hormone replacement therapy for greater than 10 years Nonsmoker Review of Systems  Denies weight changes, fever, chills, weakness in extremities.     Objective:   Physical Exam  Gen: NAD MSK: negtive SLT, neg FABER bilateral, pt has mild tightness of right hamstring more than last exam.  Pt has strong abductors and adductors bilateral.  Pt does have moderate tenderness just lateral to and superior to the puriform muscle.  No edema no skin changes, pain illicite with extreme flexion of hip but full ROM of hip.         Assessment & Plan:    Right hip /  buttock pain. Long discussion with patient regarding options. She is minimally better. She hasn't set a date she needs to know what is going on because she's planning on running either half marathon or marathon. We decided contrast MRI of the pelvis. I suspect she has obturator muscle issues of a stress fracture cannot totally be ruled out. We'll set that up and contact her after we have the results.

## 2011-01-01 ENCOUNTER — Other Ambulatory Visit (HOSPITAL_BASED_OUTPATIENT_CLINIC_OR_DEPARTMENT_OTHER): Payer: 59

## 2011-01-03 ENCOUNTER — Other Ambulatory Visit: Payer: Self-pay | Admitting: *Deleted

## 2011-01-03 ENCOUNTER — Encounter: Payer: Self-pay | Admitting: Family Medicine

## 2011-01-03 DIAGNOSIS — M25551 Pain in right hip: Secondary | ICD-10-CM

## 2011-01-03 DIAGNOSIS — S76319A Strain of muscle, fascia and tendon of the posterior muscle group at thigh level, unspecified thigh, initial encounter: Secondary | ICD-10-CM

## 2011-01-03 LAB — BASIC METABOLIC PANEL
BUN: 12 mg/dL (ref 6–23)
Calcium: 9.6 mg/dL (ref 8.4–10.5)
Chloride: 107 mEq/L (ref 96–112)
Glucose, Bld: 114 mg/dL — ABNORMAL HIGH (ref 70–99)
Potassium: 4.7 mEq/L (ref 3.5–5.3)
Sodium: 143 mEq/L (ref 135–145)

## 2011-01-05 ENCOUNTER — Ambulatory Visit (HOSPITAL_BASED_OUTPATIENT_CLINIC_OR_DEPARTMENT_OTHER)
Admission: RE | Admit: 2011-01-05 | Discharge: 2011-01-05 | Disposition: A | Discharge status: Home / Self Care | Payer: 59 | Source: Ambulatory Visit | Attending: Family Medicine | Admitting: Family Medicine

## 2011-01-05 ENCOUNTER — Other Ambulatory Visit: Payer: Self-pay | Admitting: Family Medicine

## 2011-01-05 DIAGNOSIS — G589 Mononeuropathy, unspecified: Secondary | ICD-10-CM | POA: Insufficient documentation

## 2011-01-05 DIAGNOSIS — M25551 Pain in right hip: Secondary | ICD-10-CM

## 2011-01-05 DIAGNOSIS — S76319A Strain of muscle, fascia and tendon of the posterior muscle group at thigh level, unspecified thigh, initial encounter: Secondary | ICD-10-CM

## 2011-01-05 DIAGNOSIS — IMO0001 Reserved for inherently not codable concepts without codable children: Secondary | ICD-10-CM | POA: Insufficient documentation

## 2011-01-07 ENCOUNTER — Telehealth: Payer: Self-pay | Admitting: Family Medicine

## 2011-01-11 NOTE — Telephone Encounter (Signed)
Discussed MRI findings---they do not explain her hip pain well to me--B she has some evidence of tendinopathy at hamstrings. We discussed options incl PT which she thinks she will pursue---she has a PT person in mind--if she needs formal referral she will let me know. Alternatively I discussed Dr Sherlon Handing (chiroprcter / pilates) with her as an option. She ill consider and let us know what we need to do for her.

## 2011-03-01 ENCOUNTER — Other Ambulatory Visit: Payer: Self-pay | Admitting: Obstetrics and Gynecology

## 2011-03-01 DIAGNOSIS — Z1231 Encounter for screening mammogram for malignant neoplasm of breast: Secondary | ICD-10-CM

## 2011-04-07 ENCOUNTER — Ambulatory Visit
Admission: RE | Admit: 2011-04-07 | Discharge: 2011-04-07 | Disposition: A | Discharge status: Home / Self Care | Payer: 59 | Source: Ambulatory Visit | Attending: Obstetrics and Gynecology | Admitting: Obstetrics and Gynecology

## 2011-04-07 DIAGNOSIS — Z1231 Encounter for screening mammogram for malignant neoplasm of breast: Secondary | ICD-10-CM

## 2011-08-17 ENCOUNTER — Ambulatory Visit (INDEPENDENT_AMBULATORY_CARE_PROVIDER_SITE_OTHER): Payer: 59 | Admitting: Sports Medicine

## 2011-08-17 VITALS — BP 140/84 | HR 62

## 2011-08-17 DIAGNOSIS — R079 Chest pain, unspecified: Secondary | ICD-10-CM

## 2011-08-17 DIAGNOSIS — M25579 Pain in unspecified ankle and joints of unspecified foot: Secondary | ICD-10-CM

## 2011-08-17 HISTORY — DX: Pain in unspecified ankle and joints of unspecified foot: M25.579

## 2011-08-17 HISTORY — DX: Chest pain, unspecified: R07.9

## 2011-08-17 NOTE — Patient Instructions (Signed)
Please use ankle sleeve for running or other strenuous activity   Please do suggested exercises daily  Follow up in 4 weeks   Thank you for seeing [place] today!

## 2011-08-17 NOTE — Assessment & Plan Note (Signed)
This appears to have been a traumatic sprain Not unstable With persistent swelling and pain she needs rehab  Will try her on ankle compression wrap for running over next 6 to 12 wks Std ankle rehab to focus on 1 foot activity  Reck in 6 wks

## 2011-08-17 NOTE — Progress Notes (Signed)
  Subjective:    Patient ID: Rhonda [name], female    DOB: [birth year], 61 y.o.   MRN: [id number]  HPI  Pt presents to clinic for eval of right ankle pain that started 1.5 week ago when she twisted ankle on uneven grass.  Has pain in upper medial ankle and sharp pain behind lateral malleolus.  Pain is worse with increased activity, but also occurs at rest.  She has done icing, but unable to take NSAIDs, ASA, or tylenol 2/2 allergies.  Reports getting sick in [place] - was admitted to hospital with chest pain in September.  Had another episode on Thanksgiving was having chest pain that radiated to shoulder and neck.  Went to ER - heart attach was ruled out, PCP ordered ETT and stress echo- these were negative.  She also reports have feet and hand swelling and numbness.   Review of Systems  Chest pain feels like a hard pressure on central chest Sometimes occurs with effort She felt this at max effort on ETT even though ECHO was neg at that point  Hx of breast cancer Hx of radiation to chest Was on tamoxifen/ no use of adriamycin use Objective:   Physical Exam Pleasant and NAD  Rt ankle exam: Swollen sinus tarsi on rt Percussion on rt lat malleolus not painful Squeeze test of AT and peroneal tendons not painful  Laxity to inversion Click over peroneal tendon with inversion Drawer slightly loose  LT Ankle: No visible erythema or swelling. Range of motion is full in all directions. Strength is 5/5 in all directions. Stable lateral and medial ligaments; squeeze test and kleiger test unremarkable; Talar dome nontender; No pain at base of 5th MT; No tenderness over cuboid; No tenderness over N spot or navicular prominence No tenderness on posterior aspects of lateral and medial malleolus No sign of peroneal tendon subluxations; Negative tarsal tunnel tinel's Able to walk 4 steps.  MSK [place] Partial tear of RT ATF ligament on dynamic testing Edema around peroneal tendons No fx  along fibula Ankle joint without effusion Assessment & Plan:

## 2011-08-17 NOTE — Assessment & Plan Note (Signed)
I encouraged her to discuss with Elmer Sow Consider additional workup - even cath if other tests not definitive  She wants to keep running and seems to be still having worrisome chest pain

## 2011-09-14 ENCOUNTER — Ambulatory Visit: Payer: 59 | Admitting: Sports Medicine

## 2012-02-10 ENCOUNTER — Encounter (HOSPITAL_COMMUNITY): Payer: Self-pay | Admitting: Emergency Medicine

## 2012-02-10 ENCOUNTER — Emergency Department (HOSPITAL_COMMUNITY)
Admission: EM | Admit: 2012-02-10 | Discharge: 2012-02-10 | Disposition: A | Discharge status: Home / Self Care | Payer: 59 | Source: Home / Self Care | Attending: Emergency Medicine | Admitting: Emergency Medicine

## 2012-02-10 DIAGNOSIS — J019 Acute sinusitis, unspecified: Secondary | ICD-10-CM

## 2012-02-10 HISTORY — DX: Pure hypercholesterolemia, unspecified: E78.00

## 2012-02-10 MED ORDER — PREDNISONE 5 MG PO KIT: 1.0000 | PACK | Freq: Every day | ORAL | Status: DC

## 2012-02-10 MED ORDER — AZITHROMYCIN 250 MG PO TABS: ORAL_TABLET | ORAL | Status: DC

## 2012-02-10 NOTE — ED Notes (Signed)
Dr Charlyne Mom in [place] at [facility] is her pcp

## 2012-02-10 NOTE — ED Provider Notes (Signed)
Chief Complaint  Patient presents with  . URI    History of Present Illness:   Rhonda [name] is a 61 year old female who has had a three-day history of nasal congestion with yellow-green drainage and maxillary pressure on the left. Her left ear feels a little congested. She denies any fever, headache, or sore throat. She's had no coughing, wheezing, or GI complaints. She has a history of sinus infections about twice a year which usually respond to a Z-Pak and a prednisone Dosepak. She's planning on running a marathon this weekend and would like to be at top condition for that.  Review of Systems:  Other than noted above, the patient denies any of the following symptoms. Systemic:  No fever, chills, sweats, fatigue, myalgias, headache, or anorexia. Eye:  No redness, pain or drainage. ENT:  No earache, ear congestion, nasal congestion, sneezing, rhinorrhea, sinus pressure, sinus pain, post nasal drip, or sore throat. Lungs:  No cough, sputum production, wheezing, shortness of breath, or chest pain. GI:  No abdominal pain, nausea, vomiting, or diarrhea.  PMFSH:  Past medical history, family history, social history, meds, and allergies were reviewed.  Physical Exam:   Vital signs:  BP 141/62  Pulse 62  Temp 98.1 F (36.7 C) (Oral)  Resp 16  SpO2 100% General:  Alert, in no distress. Eye:  No conjunctival injection or drainage. Lids were normal. ENT:  TMs and canals were normal, without erythema or inflammation.  Nasal mucosa was clear and uncongested, without drainage.  Mucous membranes were moist.  Pharynx was clear, without exudate or drainage.  There were no oral ulcerations or lesions. Neck:  Supple, no adenopathy, tenderness or mass. Lungs:  No respiratory distress.  Lungs were clear to auscultation, without wheezes, rales or rhonchi.  Breath sounds were clear and equal bilaterally.  Heart:  Regular rhythm, without gallops, murmers or rubs. Skin:  Clear, warm, and dry, without rash or  lesions.  Assessment:  The encounter diagnosis was Acute sinusitis.  Plan:   1.  The following meds were prescribed:   New Prescriptions   AZITHROMYCIN (ZITHROMAX Z-PAK) 250 MG TABLET    Take as directed.   PREDNISONE 5 MG KIT    Take 1 kit (5 mg total) by mouth daily after breakfast. Prednisone 5 mg 6 day dosepack.  Take as directed.   2.  The patient was instructed in symptomatic care and handouts were given. 3.  The patient was told to return if becoming worse in any way, if no better in 3 or 4 days, and given some red flag symptoms that would indicate earlier return.   Reuben Likes, MD 02/10/12 [facility]

## 2012-02-10 NOTE — ED Notes (Addendum)
Onset of symptoms were 2 days ago, patient reports pressure in face and behind eyes, particularly on the left .  Denies cough.  Reports yellow/green secretions.  Denies fever.  Patient states she needs zpack and prednisone pack.

## 2012-02-20 ENCOUNTER — Other Ambulatory Visit: Payer: Self-pay | Admitting: Obstetrics and Gynecology

## 2012-02-20 DIAGNOSIS — Z1231 Encounter for screening mammogram for malignant neoplasm of breast: Secondary | ICD-10-CM

## 2012-02-29 ENCOUNTER — Encounter: Payer: Self-pay | Admitting: *Deleted

## 2012-03-29 ENCOUNTER — Ambulatory Visit: Payer: 59 | Admitting: Sports Medicine

## 2012-03-29 ENCOUNTER — Ambulatory Visit (INDEPENDENT_AMBULATORY_CARE_PROVIDER_SITE_OTHER): Payer: 59 | Admitting: Sports Medicine

## 2012-03-29 VITALS — BP 122/80 | Ht 60.0 in | Wt 130.0 lb

## 2012-03-29 DIAGNOSIS — M79672 Pain in left foot: Secondary | ICD-10-CM

## 2012-03-29 DIAGNOSIS — M79609 Pain in unspecified limb: Secondary | ICD-10-CM

## 2012-03-29 DIAGNOSIS — R200 Anesthesia of skin: Secondary | ICD-10-CM

## 2012-03-29 DIAGNOSIS — R202 Paresthesia of skin: Secondary | ICD-10-CM | POA: Insufficient documentation

## 2012-03-29 HISTORY — DX: Paresthesia of skin: R20.0

## 2012-03-29 NOTE — Progress Notes (Signed)
Subjective:   1. Left foot pain-patient running 1/2 marathon in October and states did not put shoes on properly. Around 6 miles in she started getting pain in both feet (right 5th toe started bleeding and pain on medial heel). After race, patient took shoes off and noted 2 large blisters on medial heel that had burst and bled into her shoes. She has experienced continual problems while running since that time with blisters. If she walks 2 miles or runs 2-3 miles she will feel blisters start to form and fill up and bleed at times. She is concerned because she is planning a 1/2 marathon in 2 weeks and needs to be able to run without blister formation/associated pain.   ROS--See HPI  Past Medical History-history of breast cancer, asthma, tibial stress fractures Reviewed problem list.  Medications- reviewed and updated Chief complaint-noted  Objective: BP 122/80  Ht 5' (1.524 m)  Wt 130 lb (58.968 kg)  BMI 25.39 kg/m2 Gen: pleasant female in NAD MSK: Left heel noted to have 2 small 1cm areas with slight loose skin in circular pattern indicative of healing blisters. Noted on inside of patient shoes wearing along medial heel of left shoe. Patient stands in essentially neutral position and walks in similar pattern.  Leg length essentially equal while sitting (do note unequal leg length noted in history)  Assessment/Plan: See problem oriented charted

## 2012-03-29 NOTE — Assessment & Plan Note (Signed)
Blisters caused by friction on inside of medial heel with shoe. Discussed/planned several options with patient.  1. Patient fitted with wedge in heel of left shoe to offload pressure.  2. Patient encouraged to avoid stability shoe. She plans to purchase a shoe in near future with neutral pressure and soft heel counter.  3. Patient given mole skin to try on inside of shoe.  Also, patient encouraged to continue hip abductor and quad strengthening exercises.  Will follow up as needed with Dr. Darrick Penna.

## 2012-04-09 ENCOUNTER — Ambulatory Visit
Admission: RE | Admit: 2012-04-09 | Discharge: 2012-04-09 | Disposition: A | Discharge status: Home / Self Care | Payer: 59 | Source: Ambulatory Visit | Attending: Obstetrics and Gynecology | Admitting: Obstetrics and Gynecology

## 2012-04-09 DIAGNOSIS — Z1231 Encounter for screening mammogram for malignant neoplasm of breast: Secondary | ICD-10-CM

## 2012-04-13 ENCOUNTER — Other Ambulatory Visit: Payer: Self-pay | Admitting: *Deleted

## 2012-04-13 MED ORDER — TRAMADOL HCL 50 MG PO TABS: 50.0000 mg | ORAL_TABLET | Freq: Four times a day (QID) | ORAL | Status: DC | PRN

## 2012-04-13 NOTE — Progress Notes (Signed)
Pt stated she injured her back.  Would like tramadol refilled, approved by Dr. Darrick Penna.

## 2012-08-29 ENCOUNTER — Ambulatory Visit (INDEPENDENT_AMBULATORY_CARE_PROVIDER_SITE_OTHER): Payer: 59 | Admitting: Sports Medicine

## 2012-08-29 ENCOUNTER — Encounter: Payer: Self-pay | Admitting: Sports Medicine

## 2012-08-29 VITALS — BP 143/86 | HR 69 | Ht 60.0 in | Wt 139.0 lb

## 2012-08-29 DIAGNOSIS — M84376A Stress fracture, unspecified foot, initial encounter for fracture: Secondary | ICD-10-CM

## 2012-08-29 DIAGNOSIS — M84375A Stress fracture, left foot, initial encounter for fracture: Secondary | ICD-10-CM

## 2012-08-29 DIAGNOSIS — M79609 Pain in unspecified limb: Secondary | ICD-10-CM

## 2012-08-29 DIAGNOSIS — M79672 Pain in left foot: Secondary | ICD-10-CM

## 2012-08-29 HISTORY — DX: Stress fracture, unspecified foot, initial encounter for fracture: M84.376A

## 2012-08-29 NOTE — Assessment & Plan Note (Signed)
I placed cushion under the fifth metatarsal in her insoles  If this is feeling comfortable with the arch pad and cushion we will place these in her running shoes as well  She is to increase intake of calcium and vitamin D  She will crosstraining for 2 weeks evident ease back into running  Recheck in 4 weeks

## 2012-08-29 NOTE — Progress Notes (Signed)
Patient ID: Rhonda [name], female   DOB: January 23, 1951, 62 y.o.   MRN: [id number]  Left foot pain laterally Started 2 mos ago without any increase in training No injury No swelling noted Hurts worse later in day after standing  Exam Left arch down very slightly PF looks swollen Tenderness at the distal portion of the left fifth metatarsal shaft The left fifth MTP joint is non-swollen No tenderness at the base of the fifth metatarsal No tenderness at the cuboid  Ultrasound At the distal fifth metatarsal shaft there is a small spur and what looks to be a cortical disruption There is increased Doppler flow in this area There is increased hypoechoic change

## 2012-08-29 NOTE — Patient Instructions (Addendum)
Early stress fracture Distal 5th metatarsal  Use arch support Use cushion  Take 1500 mgm of calcium and 800 IU of Vit D daily  Repeat the scan in 4 weeks  Modify training to be pain free during this time  Coenzyme Q is usually helpful for the muscle pain caused by statins - see section on statin myopathy

## 2012-08-29 NOTE — Assessment & Plan Note (Addendum)
Patient has had some increased pain over the left foot primarily over the fifth metatarsal shaft  Also some swelling of the arch  On ultrasound the arch shows a thickened plantar fashion in the midportion of the are suggestive arch strain  The metatarsal looks abnormal  We will use scaphoid pad to her running shoes to try to support the arch

## 2012-09-25 ENCOUNTER — Ambulatory Visit (INDEPENDENT_AMBULATORY_CARE_PROVIDER_SITE_OTHER): Payer: 59 | Admitting: Sports Medicine

## 2012-09-25 VITALS — BP 117/77 | Ht 60.0 in | Wt 139.0 lb

## 2012-09-25 DIAGNOSIS — M84469D Pathological fracture, unspecified tibia and fibula, subsequent encounter for fracture with routine healing: Secondary | ICD-10-CM

## 2012-09-25 DIAGNOSIS — M84375D Stress fracture, left foot, subsequent encounter for fracture with routine healing: Secondary | ICD-10-CM

## 2012-09-25 NOTE — Progress Notes (Signed)
  Subjective:    Patient ID: Rhonda [name], female    DOB: 1950/09/11, 62 y.o.   MRN: [id number]  HPI  1. F/u left foot pain. 5th metatarsal stress reaction. Patient reports improved pain from previous. She still notes a sensation that "her foot joint needs to pop" only when she wears sandals or less supportive shoes. She is running at reduced mileage as recommended at last visit, limiting herself to 2-3 miles about 3 times weekly. She denies any swelling, bruising, new injury, numbness.   She reports having a normal bone mineral density test 3 weeks ago. Has a history of several stress fractures, nearly yearly.   She wants to do another half marathon in August and [place] marathon in October.  She finds that the sports insoles are painful  Review of Systems See HPI otherwise negative.  reports that she has never smoked. She does not have any smokeless tobacco history on file.     Objective:   Physical Exam  Constitutional: She is oriented to person, place, and time. She appears well-developed and well-nourished.  Musculoskeletal:  Left foot appears wnl. No effusion, warmth, tenderness or rash. ROM intact. No pain with inversion or eversion.   Neurological: She is alert and oriented to person, place, and time.   no palpable swelling or tenderness over the distal fifth metatarsal There is no instability at the cuboid  MSK [place]: left foot shows improved distal 5th metatarsal fracture with some callus formation. Mild blood flow seen on doppler. The base of metatarsal and cuboid joint with peroneus brevis tendon appear wnl.      Assessment & Plan:

## 2012-09-25 NOTE — Patient Instructions (Signed)
Continue with decreased running regimen for 2 more weeks. Increase long run to 4 mile. You can move the 2 mile runs to outside.  Keep taking vitamin D and calcium. Isometric lateral foot pushes with thera-band.  One foot stand and knee bend for stabilization strengthening.

## 2012-09-25 NOTE — Assessment & Plan Note (Signed)
Appears to be healing, but still some sign of inflammation. Recommend she stick with reduced mileage for next 2 weeks. Continue ice and stability shoes. She did not tolerate the inserts due to discomfort. Continue vitamin D and calcium. Plan f/u prn or if symptoms worsen.

## 2012-10-09 ENCOUNTER — Ambulatory Visit (INDEPENDENT_AMBULATORY_CARE_PROVIDER_SITE_OTHER): Payer: 59 | Admitting: Sports Medicine

## 2012-10-09 VITALS — BP 128/77 | Ht 60.0 in | Wt 139.0 lb

## 2012-10-09 DIAGNOSIS — M84375D Stress fracture, left foot, subsequent encounter for fracture with routine healing: Secondary | ICD-10-CM

## 2012-10-09 DIAGNOSIS — M8448XD Pathological fracture, other site, subsequent encounter for fracture with routine healing: Secondary | ICD-10-CM

## 2012-10-09 NOTE — Progress Notes (Signed)
Patient ID: Rhonda [name], female   DOB: 1950-08-24, 62 y.o.   MRN: [id number]  Patient returns for followup of left distal fifth metatarsal stress fracture We started her on a return to running program on last visit She has been able to do this without much pain in her long run is up 6 miles She does have some lateral pain but not over the area of the stress fracture but closer to the base of the fifth metatarsal She thinks the shoes are not giving her the support she needs and has ordered a new pair  Physical examination  No acute distress No tenderness on palpation all over the fifth metatarsal No subtle cuboid dislocation Good strength of peroneal tendons No pain or swelling of the ankle  MSK ultrasound Peroneal tendons are normal all the way to the insertion of the fifth metatarsal Distal metatarsal stress fracture has resolved and I cannot see any bony abnormalities There is still some slight increase in Doppler flow over the distal fifth metatarsal

## 2012-10-09 NOTE — Assessment & Plan Note (Signed)
Clinically and on ultrasound scan this appears to have healed  She can return to running program but should build gradually  Try the new running shoes but if she still has pain she may need to use an ankle support to prevent excessive motion at the left ankle

## 2012-11-15 ENCOUNTER — Ambulatory Visit (INDEPENDENT_AMBULATORY_CARE_PROVIDER_SITE_OTHER): Payer: 59 | Admitting: Sports Medicine

## 2012-11-15 ENCOUNTER — Encounter: Payer: Self-pay | Admitting: Sports Medicine

## 2012-11-15 VITALS — BP 133/77 | HR 69 | Ht 60.0 in | Wt 131.0 lb

## 2012-11-15 DIAGNOSIS — T148XXA Other injury of unspecified body region, initial encounter: Secondary | ICD-10-CM | POA: Insufficient documentation

## 2012-11-15 HISTORY — DX: Other injury of unspecified body region, initial encounter: T14.8XXA

## 2012-11-15 MED ORDER — NITROGLYCERIN 0.2 MG/HR TD PT24: 1.0000 | MEDICATED_PATCH | Freq: Every day | TRANSDERMAL | Status: DC

## 2012-11-15 NOTE — Patient Instructions (Addendum)
You have a small tear in your quad muscle right near where it inserts into the bone. This will heal.  No running for the next 2 weeks. You can try a walk 5 minutes, jog 1 minute routine as long as it does not cause about a 3/10 pain. You can cross-train on the bike or elliptical as long as it does not cause pain.  Wear the compression sleeve as high up on your thigh as you can when ever you do activity.  Do the quad exercises I showed you. - without weight work up to 3 sets of 30  *straight leg raise  *partial knee extension (with pillow under your thigh)  *full knee extension in chair - once you can do 3 sets of 30 without pain or discomfort, get a 2lb ankle weight and work up to 3 sets of 15  Nitroglycerin Protocol   Apply 1/4 nitroglycerin patch to affected area daily.  Change position of patch within the affected area every 24 hours.  You may experience a headache during the first 1-2 weeks of using the patch, these should subside.  If you experience headaches after beginning nitroglycerin patch treatment, you may take your preferred over the counter pain reliever.  Another side effect of the nitroglycerin patch is skin irritation or rash related to patch adhesive.  Please notify our office if you develop more severe headaches or rash, and stop the patch.  Tendon healing with nitroglycerin patch may require 12 to 24 weeks depending on the extent of injury.  Men should not use if taking Viagra, Cialis, or Levitra.   Do not use if you have migraines or rosacea.   Follow up in 2 weeks.

## 2012-11-15 NOTE — Progress Notes (Signed)
Patient ID: Rhonda [name], female   DOB: 05-25-1950, 62 y.o.   MRN: [id number] Subjective: Ms. Hoerner is here today for left anterior hip pain.  She was recently seen for a left 5th metatarsal stress fracture.  She states that this is completely resolved.  She now has pain in the anterior left hip.  She reports that the initially injury was a left quad strain while she was cross training on the bike about 2 weeks ago.  She was able to do her work outs without pain through last Saturday.  That day she did a 10 mile run and her quad seized up afterward, and she then noticed some left anterior hip pain above the quad.  She states that walking and sitting/standing don't cause pain, but any running or jarring stuff does.  She took Monday and Tuesday off and ran 4 miles yesterday and had pain the entire run.  She has been icing frequently with minimal improvement.  Tylenol yesterday did help some.   She is training for a 1/2 marathon next month and the [place] Marathon in October.  Objective: BP 133/77  Pulse 69  Ht 5' (1.524 m)  Wt 131 lb (59.421 kg)  BMI 25.58 kg/m2 Gen: alert, cooperative, NAD  Left Hip: ROM IR: 45 Deg, ER: 45 Deg, Flexion: 120 Deg Strength Flexion: 4/5 with some pain, Abduction: 4/5 with some pain, Quad 5/5 Pelvic alignment unremarkable to inspection and palpation. Standing hip rotation and gait without trendelenburg sign / unsteadiness. Greater trochanter without tenderness to palpation. No tenderness over piriformis and greater trochanter.  MSK Ultrasound Left hip: Hypoechoic changes in the rectus femoris approximate 3cm distal to insertion measuring 2.3cm longitudinally.  Hypoechoic changes at AIIS insertion of rectus femoris.  A/P: 62 yo woman with left anterior hip pain due to rectus femoris tear and likely partial rectus femoris tendon tear as seen on ultrasound. See problem list for specifics.

## 2012-11-15 NOTE — Assessment & Plan Note (Addendum)
Likely related to some dehydration and resultant muscle spasm. 2.3cm tear seen on ultrasound. Concern for partial tendon tear at AIIS as well. Thigh compression sleeve with activity. Relative rest for 2 weeks - no running, activity guided by pain. Quad exercises given. Emphasized importance of hydration with water and electrolytes.  Nitroglycerin discussed with patient and prescription given - she will start the protocol if she does not see any improvement in the next week. Follow up in 2 weeks.

## 2012-11-29 ENCOUNTER — Encounter: Payer: Self-pay | Admitting: Sports Medicine

## 2012-11-29 ENCOUNTER — Ambulatory Visit (INDEPENDENT_AMBULATORY_CARE_PROVIDER_SITE_OTHER): Payer: 59 | Admitting: Sports Medicine

## 2012-11-29 VITALS — BP 124/80 | HR 65 | Ht 60.0 in | Wt 131.0 lb

## 2012-11-29 DIAGNOSIS — T148XXA Other injury of unspecified body region, initial encounter: Secondary | ICD-10-CM

## 2012-11-29 NOTE — Assessment & Plan Note (Signed)
The ultrasound this appeared to be about a 2 cm tear She seems to be gaining strength fairly quickly but still is unable to run very much better she feels symptoms with much hip flexion  She will consider using the nitroglycerin protocol Continue on home exercise program Continued compression  Recheck 2 weeks

## 2012-11-29 NOTE — Progress Notes (Signed)
Patient ID: Rhonda [name], female   DOB: 1951/02/22, 62 y.o.   MRN: [id number]  2 wks ago seen with left rectus femoris tear 2 cm long 1st week walked but not able to execise 2nd week did 5 mins on multiple machines and able to walk a mile x 2 days By 3 days could run maybe 30 secs but able to do the other exercise  Walked to [place] slowly and did OK - walked a couple of miles and moderately steep  Uses a compression sleeve and this does help  Physical examination  No acute distress  Hip flexion is now strong Quadriceps extension is now strong  Hip range of motion is full Other muscles around the hip girdle are strong  Walking gait reveals no significant limp  Fast walking she was able do this without any limp  She feels some symptoms if she tries to do too much hip flexion

## 2012-11-29 NOTE — Patient Instructions (Addendum)
Cont using compression particularly when active  Keep up exercises Ice is optional  NTG may have benefit  Keep up the program of walk, elliptical, bike etc and test easy running based on feel  Running must be flat  Recheck in 2 weeks

## 2012-12-12 ENCOUNTER — Ambulatory Visit (INDEPENDENT_AMBULATORY_CARE_PROVIDER_SITE_OTHER): Payer: 59 | Admitting: Sports Medicine

## 2012-12-12 VITALS — BP 134/79 | Ht 60.0 in | Wt 131.0 lb

## 2012-12-12 DIAGNOSIS — Z5189 Encounter for other specified aftercare: Secondary | ICD-10-CM

## 2012-12-12 DIAGNOSIS — S76012D Strain of muscle, fascia and tendon of left hip, subsequent encounter: Secondary | ICD-10-CM

## 2012-12-12 NOTE — Progress Notes (Signed)
CC: Followup left hip flexor pain HPI: Patient is a very pleasant 62 year old female runner who presents for followup today. She previously was seen about 4 weeks ago with an acute rectus femoris strain with evidence of partial tear and edema on musculoskeletal ultrasound. She presents today for followup. She says she is doing better overall but still has some discomfort. She describes a lot of soreness especially with going on an incline uphill. She plans on running a half marathon next week and is wondering if she can do this. She is currently doing about 30 seconds of running for 5 minutes but notes that she cannot go as fast as normal. She is using the compression sleeve on her thigh which she doesn't think helps.  ROS: As above in the HPI. All other systems are stable or negative.  OBJECTIVE: APPEARANCE:  Patient in no acute distress.The patient appeared well nourished and normally developed. HEENT: No scleral icterus. Conjunctiva non-injected Resp: Non labored Skin: No rash MSK:  Left Hip exam:  - No swelling or deformity - Full range of motion in flexion, extension, internal and external rotation without pain - Negative logroll - Tenderness to palpation over the proximal rectus femoris tendon - Strength is 5 out of 5 in hip flexion, extension, abduction, adduction - Neurovascularly intact - FABERs negative   MSK [place]: Ultrasound was performed and transverse and longitudinal views. There were is hypoechoic change at the anterior inferior iliac spine on the left side at the proximal attachment of the rectus femoris but is decreased in size from previous ultrasound. There is also evidence of increased hypoechoic signal in the proximal rectus femoris consistent with swelling but no evidence of tears.   ASSESSMENT: #1. Partial tear of rectus femoris tendon, improving   PLAN: At this point, patient seems to have made significant progress from 4 weeks ago. We have given her hip flexion  exercises to perform for strengthening the hip flexor. She should continue to wear her compression sleeve for at least 6 months. She will gradually increase her running per patient instructions. Recommended that she not over do it at the half marathon coming up.

## 2012-12-12 NOTE — Patient Instructions (Signed)
Thank you for coming in today 1. Continue to wear compression sleeve for 6 months 2. Do your hip flexor exercises: Hip flexion with ankle weights 3 x 15, hip flexion with knee extension 3 x 15, and straight leg raise 3 x 15. 3. Ice 20 min after exercise 4. Increase running gradually,everything on flat surface - week 1: walk 4 min, run 1 min - week 2: walk 3 min, run 1 min - week 3: walk 2 min, run 1 min - week 4: walk 1 min, run 1 min

## 2012-12-20 ENCOUNTER — Encounter: Payer: Self-pay | Admitting: Sports Medicine

## 2013-01-01 ENCOUNTER — Other Ambulatory Visit: Payer: 59 | Admitting: Sports Medicine

## 2013-01-01 ENCOUNTER — Ambulatory Visit (INDEPENDENT_AMBULATORY_CARE_PROVIDER_SITE_OTHER): Payer: 59 | Admitting: Sports Medicine

## 2013-01-01 ENCOUNTER — Encounter: Payer: Self-pay | Admitting: Sports Medicine

## 2013-01-01 VITALS — BP 138/84 | HR 65 | Ht 60.0 in | Wt 131.0 lb

## 2013-01-01 DIAGNOSIS — T148XXA Other injury of unspecified body region, initial encounter: Secondary | ICD-10-CM

## 2013-01-01 NOTE — Assessment & Plan Note (Signed)
This seems much better  Good strength now on hip flexion testing  I think she can begin progressive training to try to build for her marathon in October

## 2013-01-01 NOTE — Progress Notes (Signed)
  Subjective:    Patient ID: Rhonda [name], female    DOB: 1951-03-11, 62 y.o.   MRN: [id number]  HPI Patient is a 62 year old Caucasian female who presents today for followup of a left hip flexor partial tear. She states that she has been feeling increasingly better and stronger in her left hip. She reports being compliant and regularly performs her exercises. She states that she still experiences some pain/discomfort when jogging up hills, but she states that this is nothing more than a discomfort and does not limit her abilities when exercising. She denies any further injury. She denies any swelling, ecchymoses, erythema. She denies any limp, weakness, or numbness. She has not noted anything that makes this better. She denies any activity which makes it worse outside of jogging pills. She is currently very pleased with her treatment results. She is eager to receive notice that she is ready and capable to increase her exercise routine. This is because she plans on participating in a full marathon in 7 weeks. PMH:  - Multiple lower extremity stress fracture and tendinopathy history  - See chart in EPIC Allergies:  - Acetaminophen  - Aspirin  - NSAIDs Medications:  - Albuterol 108 (90 base) mcg/ATC inhaler  - Pitavastatin calcium 2 mg  - Tramadol 50 mg  Review of Systems Denies recent fever, chills, headache, cough, shortness of breath.    Objective:   Physical Exam General: Patient is a very active 62 year old Caucasian female who appears his stated age. She is alert, oriented, in no acute distress. Vitals: Reviewed Hip: Full ROM. Strength 5/5 bilaterally. Sensation is grossly intact. Pelvic alignment unremarkable to inspection. No tenderness to palpation at the greater trochanter. No pain with FABER or FADIR. No pain experienced with any active muscle contraction against resistance.     Assessment & Plan:  1) partial muscle tear; hip flexor, left-sided  - Clearance for increased  exercise regimen (3 runs/1 crosstraining per week)  - Encouragement was provided towards proper crosstraining exercises (stationary bike 1/week) 2) followup: PRN   This note was dictated by Mickie Hillier, MS4. Under the supervision of Dr. Darrick Penna

## 2013-01-01 NOTE — Patient Instructions (Addendum)
Plan for training:  Week 1 - Run 4 days - 3 days 5/6 and 5 miles with 5 mins run and 1 min walk // long run stay out two hours Cross train on stationary bike at least 1 day per week  Week 2 - add 1 mile to each run and add 20 mins to long day but increase run to 9 mins and 1 min walk  Week 3 - 6/7 and 6 miles - long run up to 3 hours OK to run up to 15 mins without walk  Then progress as tolerated  Get ready 7 weeks!!!

## 2013-01-16 ENCOUNTER — Encounter: Payer: Self-pay | Admitting: Sports Medicine

## 2013-01-16 ENCOUNTER — Ambulatory Visit (INDEPENDENT_AMBULATORY_CARE_PROVIDER_SITE_OTHER): Payer: 59 | Admitting: Sports Medicine

## 2013-01-16 VITALS — BP 158/82 | HR 63 | Ht 60.0 in | Wt 131.0 lb

## 2013-01-16 DIAGNOSIS — R209 Unspecified disturbances of skin sensation: Secondary | ICD-10-CM

## 2013-01-16 DIAGNOSIS — R200 Anesthesia of skin: Secondary | ICD-10-CM

## 2013-01-16 NOTE — Assessment & Plan Note (Signed)
Suspect that this is from local compression from either the shoes or compression shorts   visit note for details

## 2013-01-16 NOTE — Progress Notes (Signed)
  Subjective:    Patient ID: Rhonda [name], female    DOB: [birth year], 62 y.o.   MRN: [id number]  HPI 62y/o female with a hx of left hip flexor partial tear here for concerns of b/l lower leg and feet "tingling" without numbness. She states she normally wears a compression sleeve on her left thigh. Recently the compression sleeve was becoming loose so she started to wear compression shorts which go down to her knees. She states the compression shorts are very tight. On Saturday she did a 13 mile race and after the race she noticed b/l lower ext tingling. She states the symptoms went away after a few hours. The next few days, she would continue to have tingling in the lower ext after a race. Each of these races were with the compression shorts. She also mentions that she feels that both of her feet are starting to swell up and she no longer fits into her normal running shoes.    She also mentions that the past few days she has had decreased urine output. She is able to urinate but she feels she is urinating less. She denies any pain, burning, dribbling with urination. She denies discoloration or foul odor to urine.  She denies any saddle anesthesia. No difficulties with bowel.    Review of Systems     Objective:   Physical Exam No acute distress  Right leg and ankle - no point tenderness, normal anterior drawer and talar tilt test. Normal strength and good ROM. No swelling  Left leg and ankle - no point tenderness, normal anterior drawer and talar tilt test. Normal strength and good ROM. No swelling or effusion  Back - Normal straight leg raise, normal flexion and extension. No point tenderness.  Normal patellar and achilles reflex b/l.   She has normal pulses and normal sensation in the feet      Assessment & Plan:   Bilateral lower leg pain - most likely due to common peroneal nerve compression. This may be due to her compression shorts or due to swelling in her feet from running  which is causing compression over the dorsum of the foot that radiates up into the lower extremity. Today we placed tongue pads in her sneakers and she felt a 90% improvement right away. Will have her wear shorter compression shorts, if she continues to have pain, then avoid the compression shorts. She will start vit B6 for help with nerve healing.

## 2013-02-20 ENCOUNTER — Ambulatory Visit (INDEPENDENT_AMBULATORY_CARE_PROVIDER_SITE_OTHER): Payer: 59 | Admitting: Sports Medicine

## 2013-02-20 ENCOUNTER — Encounter: Payer: Self-pay | Admitting: Sports Medicine

## 2013-02-20 VITALS — BP 121/69 | HR 67 | Ht 60.0 in | Wt 131.0 lb

## 2013-02-20 DIAGNOSIS — M25569 Pain in unspecified knee: Secondary | ICD-10-CM

## 2013-02-20 DIAGNOSIS — M25562 Pain in left knee: Secondary | ICD-10-CM

## 2013-02-20 NOTE — Progress Notes (Signed)
CC: Left knee pain, followup left hip flexor pain HPI: Patient is a 62 year old female who we have been following for left hip flexor pain. However, she has been doing very well and has had no further pain. However, she ran a marathon on Sunday in [place] and started to have left knee pain. This started about mile 18. She notes pain in the anterior lateral aspect of her knee deep inside the knee. It is a sharp pain. She also has pain with stairs. She has rested since the marathon. Overall she thinks she is improving. She has had 2 knee surgeries for meniscectomies. She denies any swelling or mechanical symptoms. She is not currently taking any medications or using ice.  ROS: As above in the HPI. All other systems are stable or negative.  OBJECTIVE: APPEARANCE:  Patient in no acute distress.The patient appeared well nourished and normally developed. HEENT: No scleral icterus. Conjunctiva non-injected Resp: Non labored Skin: No rash MSK:  Left Knee - Inspection normal with no erythema or effusion or obvious bony abnormalities.  - Palpation normal with no warmth or joint line tenderness. No TTP at patellar or quad tendon.  - ROM normal in flexion and extension. - Strength 5/5 in flexion and extension. - Ligaments with solid consistent endpoints including ACL, PCL, LCL, MCL.  - Negative Mcmurray's.  - Non painful patellar compression.  - Neurovascularly intact  ASSESSMENT: #1. Left knee pain, suspect mild bony contusion secondary to high-impact marathon running on knee that is status post meniscectomy x2 with decreased cushioning   PLAN: Offered reassurance. Recommended that patient rest for the next week. At that time she may start to gradually increase back into running but should back off if her symptoms return. She may continue ice. Continue quad exercises.

## 2013-02-20 NOTE — Patient Instructions (Signed)
Thank you for coming in today We think that you may have bruised or irritated the bones since you have had meniscus surgery and don't have as much cushioning. Rest until you are relatively pain free Ice knee 2-3 x per day Do quad exercises

## 2013-03-04 ENCOUNTER — Other Ambulatory Visit: Payer: Self-pay

## 2013-03-04 DIAGNOSIS — Z1231 Encounter for screening mammogram for malignant neoplasm of breast: Secondary | ICD-10-CM

## 2013-03-07 ENCOUNTER — Ambulatory Visit (INDEPENDENT_AMBULATORY_CARE_PROVIDER_SITE_OTHER): Payer: 59 | Admitting: Sports Medicine

## 2013-03-07 ENCOUNTER — Encounter: Payer: Self-pay | Admitting: Sports Medicine

## 2013-03-07 VITALS — BP 123/83 | Ht 60.0 in | Wt 131.0 lb

## 2013-03-07 DIAGNOSIS — M7650 Patellar tendinitis, unspecified knee: Secondary | ICD-10-CM

## 2013-03-07 DIAGNOSIS — M7652 Patellar tendinitis, left knee: Secondary | ICD-10-CM

## 2013-03-07 HISTORY — DX: Patellar tendinitis, unspecified knee: M76.50

## 2013-03-07 NOTE — Progress Notes (Signed)
Patient ID: Rhonda [name], female   DOB: 1950-06-10, 62 y.o.   MRN: [id number]  Patient returns after running a marathon And developing knee pain. She was seen 2 weeks ago and felt to have a contusion. Most of her knee pain has been anterior. No mechanical symptoms. No significant swelling. A lot of pain with going up stairs and in fact she goes up backwards She is able to walk with no pain but cannot run  Physical examination No acute distress Mild pain over the anterior knee and patellar tendon LEFT Knee: Normal to inspection with no erythema or effusion or obvious bony abnormalities. Palpation normal with no warmth, joint line tenderness, patellar tenderness, or condyle tenderness. ROM full in flexion and extension and lower leg rotation. Ligaments with solid consistent endpoints including ACL, PCL, LCL, MCL. Negative Mcmurray's, Apley's, and Thessalonian tests. Non painful patellar compression. Patellar glide without crepitus. Patellar and quadriceps tendons unremarkable. Hamstring and quadriceps strength is normal.   MSK ultrasound Suprapatellar pouch reveals no effusion Medial and lateral meniscus are normal Patellar groove is unremarkable Patellar tendon is normal but there is a layer of fluid along the superior surface

## 2013-03-07 NOTE — Assessment & Plan Note (Signed)
She will do icing, rest and some home exercise program to emphasize straight leg lifts She can continue walking She will try some easy running every few days and when it is less painful she can start back on a very gradual basis  She realizes that she has been racing too much and plans to slow down for the next few months

## 2013-04-10 ENCOUNTER — Ambulatory Visit
Admission: RE | Admit: 2013-04-10 | Discharge: 2013-04-10 | Disposition: A | Discharge status: Home / Self Care | Payer: 59 | Source: Ambulatory Visit

## 2013-04-10 DIAGNOSIS — Z1231 Encounter for screening mammogram for malignant neoplasm of breast: Secondary | ICD-10-CM

## 2013-04-23 ENCOUNTER — Encounter: Payer: Self-pay | Admitting: Sports Medicine

## 2013-04-23 ENCOUNTER — Ambulatory Visit (INDEPENDENT_AMBULATORY_CARE_PROVIDER_SITE_OTHER): Payer: 59 | Admitting: Sports Medicine

## 2013-04-23 VITALS — BP 131/82 | Ht 60.0 in | Wt 131.0 lb

## 2013-04-23 DIAGNOSIS — M25561 Pain in right knee: Secondary | ICD-10-CM

## 2013-04-23 DIAGNOSIS — M25569 Pain in unspecified knee: Secondary | ICD-10-CM

## 2013-04-23 HISTORY — DX: Pain in right knee: M25.561

## 2013-04-23 NOTE — Assessment & Plan Note (Addendum)
MSK US revealed injury to lateral meniscus. Patient given compression sleeve x 2. Patient to slowly return to normal activity. Patient discouraged from running temporarily. Follow up in 4 weeks for re-check.

## 2013-04-23 NOTE — Progress Notes (Signed)
   Subjective:    Patient ID: Rhonda [name], female    DOB: [birth year], 62 y.o.   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  Right knee pain - It began this am after twisting her body to place a can of catfood in the recycle bin.   - Pain was severe and located laterally.  Severity: 8/10.  Pain was so bad it dropped her to the floor. - Given severity of pain, she decided to come in for evaluation.  She has not tried any medication or other intervention.  Review of Systems Per HPI    Objective:   Physical Exam Filed Vitals:   04/23/13 1029  BP: 131/82  General: well appearing lady in NAD. MSK: Right Knee: Normal to inspection with no erythema or effusion or obvious bony abnormalities. Palpation - No warmth. Lateral knee tender to palpation.  ROM full in flexion and extension and lower leg rotation. Ligaments with solid consistent endpoints including ACL, PCL, LCL, MCL. + McMurray's test. Patellar glide without crepitus. Patellar and quadriceps tendons unremarkable.    Assessment & Plan:  See Problem List

## 2013-04-23 NOTE — Patient Instructions (Signed)
Use Your brace when active.  Follow up in 4 weeks.

## 2013-05-21 ENCOUNTER — Ambulatory Visit (INDEPENDENT_AMBULATORY_CARE_PROVIDER_SITE_OTHER): Payer: 59 | Admitting: Sports Medicine

## 2013-05-21 ENCOUNTER — Encounter: Payer: Self-pay | Admitting: Sports Medicine

## 2013-05-21 VITALS — BP 128/80

## 2013-05-21 DIAGNOSIS — M25569 Pain in unspecified knee: Secondary | ICD-10-CM

## 2013-05-21 DIAGNOSIS — M25561 Pain in right knee: Secondary | ICD-10-CM

## 2013-05-21 NOTE — Assessment & Plan Note (Signed)
  sHe has improved considerably  Her running is mostly pain-free  Continue some crosstraining for the knee Continue using compression after activity  She can gradually increase training but probably needs to 3 months before being ready for marathon

## 2013-05-21 NOTE — Progress Notes (Signed)
   Subjective:    Patient ID: Rhonda [name], female    DOB: 1951-04-15, 63 y.o.   MRN: [id number]  HPI Ms. Willbanks is a 63yo female runner who presents with 1 month of right lateral knee pain for follow-up.  She felt a pop when twisting to pick up a can in the kitchen at the time of injury and she is concerned that she has damaged her meniscus.  Earlier in the month, she was prescribed a prednisone taper in addition to antibiotics for an URI, and she feels that the prednisone alleviated her knee symptoms.  She has not tried anything else for this.  At this time, she feels that her pain is manageable and that she can run well enough, but she would like to begin training for marathons soon.  She is currently running 4 times a week for 6-10 miles without significant issues and would like to increase this mileage.   Review of Systems ROS is negative except as indicated in the HPI.    Objective:   Physical Exam Gen:  Well-appearing, NAD MSK:  Scars from previous surgeries over left knee, no lesions over right.  Mildly TTP over right postero-lateral joint space.  Full ROM.  Strength 5/5 BLE.  Minimal crepitis.  Good patellar motion.  Negative apprehension.  Ligaments intact.  Negative McMurray's, Apley's, and greater than 30 deg flexion with Right-sided Thessely.       Assessment & Plan:  This is a 63yo female runner with 1 month of right knee pain who presents for follow-up.  Her pain is somewhat resolved following a prednisone taper.  A meniscal involvement is likely given her age and activity level, as some degenerative disease is to be expected.  She seems to be recovering as expected for this type of injury, but is encouraged to go slow in her training for a marathon.  Plan: - Gradually increase running mileage as tolerated - Utilize compression over knee after exercise - Follow-up as needed

## 2013-10-24 DIAGNOSIS — E785 Hyperlipidemia, unspecified: Secondary | ICD-10-CM

## 2013-10-24 DIAGNOSIS — J4520 Mild intermittent asthma, uncomplicated: Secondary | ICD-10-CM | POA: Insufficient documentation

## 2013-10-24 HISTORY — DX: Hyperlipidemia, unspecified: E78.5

## 2013-10-24 HISTORY — DX: Mild intermittent asthma, uncomplicated: J45.20

## 2013-11-13 DIAGNOSIS — H905 Unspecified sensorineural hearing loss: Secondary | ICD-10-CM

## 2013-11-13 DIAGNOSIS — R7309 Other abnormal glucose: Secondary | ICD-10-CM | POA: Insufficient documentation

## 2013-11-13 DIAGNOSIS — H8110 Benign paroxysmal vertigo, unspecified ear: Secondary | ICD-10-CM

## 2013-11-13 DIAGNOSIS — H9319 Tinnitus, unspecified ear: Secondary | ICD-10-CM

## 2013-11-13 DIAGNOSIS — G4700 Insomnia, unspecified: Secondary | ICD-10-CM

## 2013-11-13 HISTORY — DX: Tinnitus, unspecified ear: H93.19

## 2013-11-13 HISTORY — DX: Insomnia, unspecified: G47.00

## 2013-11-13 HISTORY — DX: Benign paroxysmal vertigo, unspecified ear: H81.10

## 2013-11-13 HISTORY — DX: Unspecified sensorineural hearing loss: H90.5

## 2014-01-30 DIAGNOSIS — D696 Thrombocytopenia, unspecified: Secondary | ICD-10-CM | POA: Insufficient documentation

## 2014-01-30 HISTORY — DX: Thrombocytopenia, unspecified: D69.6

## 2014-02-03 ENCOUNTER — Other Ambulatory Visit: Payer: Self-pay

## 2014-02-03 DIAGNOSIS — Z1231 Encounter for screening mammogram for malignant neoplasm of breast: Secondary | ICD-10-CM

## 2014-04-16 ENCOUNTER — Ambulatory Visit
Admission: RE | Admit: 2014-04-16 | Discharge: 2014-04-16 | Disposition: A | Discharge status: Home / Self Care | Payer: 59 | Source: Ambulatory Visit

## 2014-04-16 DIAGNOSIS — Z1231 Encounter for screening mammogram for malignant neoplasm of breast: Secondary | ICD-10-CM

## 2014-04-17 ENCOUNTER — Ambulatory Visit (INDEPENDENT_AMBULATORY_CARE_PROVIDER_SITE_OTHER): Payer: 59 | Admitting: Sports Medicine

## 2014-04-17 ENCOUNTER — Encounter: Payer: Self-pay | Admitting: Sports Medicine

## 2014-04-17 VITALS — BP 118/62 | HR 51 | Ht 60.0 in | Wt 130.0 lb

## 2014-04-17 DIAGNOSIS — M7661 Achilles tendinitis, right leg: Secondary | ICD-10-CM

## 2014-04-17 NOTE — Progress Notes (Signed)
   Subjective:    Patient ID: EMONY [name], female    DOB: 30-Nov-1950, 64 y.o.   MRN: [id number]  HPI  RIGHT ANKLE PAIN: - Patient reports gradually worsening Right ankle pain gradually over past 1-2 months. She does not recall any specific injury, fall or accident. Localizes her pain to back of her ankle over lower Achille's tendon and space behind tendon. Describes aching pain (not affecting calf, heel, or foot), without radiation, states it is tender to touch, otherwise not painful at rest, weightbearing or light activity. Worse starting walking/running, improves with activity and then worsens after significant strenuous exercise. - Patient is an avid runner, exercises very regularly, runs multiple races yearly, most recently ran half marathon last Sunday (04/12/14) with some significant worsening pain in Right Ankle. Additionally, states recently developed new running style with less heel striking and more "flat foot approach" for past 3-4 months. - Not taking any analgesia (allergic to Tylenol, NSAIDs), no icing or heat - Denies any prior Right ankle fractures, tendon tears, surgeries - Denies any ankle or joint swelling, redness, fevers/chills  I have reviewed and updated the following as appropriate: allergies and current medications  Social Hx: - Never smoker  Review of Systems  See above HPI    Objective:   Physical Exam  BP 118/62 mmHg  Pulse 51  Ht 5' (1.524 m)  Wt 130 lb (58.968 kg)  BMI 25.39 kg/m2  Gen - well-appearing, NAD MSK - Right Ankle: b/l symmetrical without edema/effusion or erythema, localized tenderness to palpation over lower Achille's tendon and retrocalcaneal bursal space otherwise non-tender, no palpation of effusion within tendon. Full active ROM. Ext - peripheral pulses intact +2 b/l Skin - warm, dry Neuro - intact muscle strength 5/5 b/l ankles, gait and hallway jogging normal  Bedside US performed: - Right Achille's Tendon: tendon appears  mostly normal with small area of separation without tear or significant inflammation/effusion, measurements within normal range, no significant retrocalcaneal bursal effusion identified     Assessment & Plan:   89 yr F without significant PMH presents for gradual worsening Right ankle pain worse with increased activity and recent half marathon < 1 week ago, no recent or prior injury. History and exam suggestive of mild Achille's strain / tendonitis, likely due to overuse and possibly from recent changed running style.  Plan: 1. Recommend relative rest from running for at least 2 weeks, while works on strengthening and stretching exercises, given handout for Achille's rehab calf raises, encouraged ice and elevation 2. Heel lifts given for trial to reduce impact with running, avoid retrocalcaneal bursitis 3. RTC 4 weeks if persistent pain, otherwise f/u PRN  Rhonda [name], [place], PGY-2   Agree with assessment and eval/   Stefanie Libel, MD

## 2014-04-18 ENCOUNTER — Ambulatory Visit: Payer: 59 | Admitting: Sports Medicine

## 2014-06-19 ENCOUNTER — Ambulatory Visit (INDEPENDENT_AMBULATORY_CARE_PROVIDER_SITE_OTHER): Payer: 59 | Admitting: Sports Medicine

## 2014-06-19 ENCOUNTER — Encounter: Payer: Self-pay | Admitting: Sports Medicine

## 2014-06-19 VITALS — BP 125/83

## 2014-06-19 DIAGNOSIS — M7661 Achilles tendinitis, right leg: Secondary | ICD-10-CM

## 2014-06-19 HISTORY — DX: Achilles tendinitis, right leg: M76.61

## 2014-06-19 NOTE — Progress Notes (Signed)
Patient ID: Rhonda [name], female   DOB: 15-Sep-1950, 64 y.o.   MRN: [id number]  Patient returns for RT AT We saw her in Dec. She did improve some with home exercises but found eccentric phase was painful so she only did heel raises She has a strogner heel cushion in 1 pair of shoes and those are better  Able to run 2 to 3 miles Gets pain once she passes 5 miles  No new injury  No swelling  Exam NAD BP 125/83 mmHg  TTP at calcaneal insetion of RT AT RT AT itself is not thickened Mild TTP at 2 cm above calcaneus but no nodule No swelling

## 2014-06-19 NOTE — Assessment & Plan Note (Signed)
Modify thpe of HEP  Run in shoe with better heel lift  Icing  Arnica gel  Reck 6 wks

## 2014-06-19 NOTE — Patient Instructions (Signed)
Try using arnica gel 3 to 4 times a day over Achilles  Try to use either more cushioned NB 1080 - or else finding a gel heel lift that feels OK to cushion heel  Ice at end of day  Use compression socks for running  Start using weights to increase calf strength  4 exercises Heel lift Bent knee heel lift Squat to 45 degrees and Lunge to 45 deg  Preferably holding 10 lbs in each hand

## 2014-07-08 ENCOUNTER — Encounter: Payer: Self-pay | Admitting: Sports Medicine

## 2014-07-08 ENCOUNTER — Ambulatory Visit (INDEPENDENT_AMBULATORY_CARE_PROVIDER_SITE_OTHER): Payer: 59 | Admitting: Sports Medicine

## 2014-07-08 VITALS — BP 130/83 | Ht 60.0 in | Wt 130.0 lb

## 2014-07-08 DIAGNOSIS — M7661 Achilles tendinitis, right leg: Secondary | ICD-10-CM

## 2014-07-08 NOTE — Assessment & Plan Note (Signed)
Tendon seems normal  Suspect irritation is from shoes and exercise/ stretch program  Discussed modifications and will reck if needed

## 2014-07-08 NOTE — Progress Notes (Signed)
Patient ID: Rhonda [name], female   DOB: May 15, 1950, 64 y.o.   MRN: [id number]   F/u right heel pain Switched to new balance shoes in Jan.   Been doing squats and heel raises with no problems  About a week ago pain was too intense with running, up to the 52mi mark Pain on distal achilles and calcaneous Then changed her shoe back to saucony and didn't have the intense pain as before   Two other factors seem to bother heel - too much motion in heel of shoe Stretching and squats in exercise class  PEXAM NAD BP 130/83 mmHg  Ht 5' (1.524 m)  Wt 130 lb (58.968 kg)  BMI 25.39 kg/m2   Ankle: No visible erythema or swelling. Range of motion is full in all directions. Strength is 5/5 in all directions. Stable lateral and medial ligaments; squeeze test and kleiger test unremarkable; Talar dome nontender; No pain at base of 5th MT; No tenderness over cuboid; No tenderness over N spot or navicular prominence No tenderness on posterior aspects of lateral and medial malleolus No sign of peroneal tendon subluxations; Negative tarsal tunnel tinel's Able to walk 4 steps.  RT AT No TTP No swelling Normal thickness  [place] RT AT now appears normal on [place] Width is 0.44

## 2014-07-22 ENCOUNTER — Encounter: Payer: Self-pay | Admitting: Sports Medicine

## 2014-07-22 ENCOUNTER — Ambulatory Visit (INDEPENDENT_AMBULATORY_CARE_PROVIDER_SITE_OTHER): Payer: 59 | Admitting: Sports Medicine

## 2014-07-22 VITALS — BP 117/52 | Ht 60.0 in | Wt 130.0 lb

## 2014-07-22 DIAGNOSIS — M7661 Achilles tendinitis, right leg: Secondary | ICD-10-CM

## 2014-07-22 NOTE — Assessment & Plan Note (Signed)
Suspect her pain is due to slight bone demineralization in setting of having been on tamoxifen in the past. Instructed patient to do gentle heel raises. She will also try capsaicin cream. She will also experiment with applying heat instead of ice.

## 2014-07-22 NOTE — Progress Notes (Signed)
   HPI:  Patient presents for follow-up of her right heel pain. She's been sore in the posterior aspect of her right ankle where the Achilles inserts into the calcaneus. She notices pain specifically after running. She no longer has much pain medially. She has not taken any medicine for this. She has an upcoming half marathon in 4 weeks, and then an upcoming full marathon in 8 weeks. She states she really needs to increase her mileage to training for these this week.  ROS: See HPI  [place]: Allergic rhinitis, basal cell carcinoma, breast cancer  PHYSICAL EXAM: BP 117/52 mmHg  Ht 5' (1.524 m)  Wt 130 lb (58.968 kg)  BMI 25.39 kg/m2 Gen: No acute distress, pleasant, cooperative MSK: Right ankle without any obvious effusion. No redness. Posterior aspect of the ankle is mildly tender to palpation over the proximal calcaneus. Achilles tendon is intact and non tender. Full strength with ankle eversion, inversion, dorsiflexion, and plantar flexion.  ASSESSMENT/PLAN:  Achilles tendinitis of right lower extremity Suspect her pain is due to slight bone demineralization in setting of having been on tamoxifen in the past. Instructed patient to do gentle heel raises. She will also try capsaicin cream. She will also experiment with applying heat instead of ice.    SIGNED: Delorse Limber. Ardelia Mems, MD Family Medicine Resident PGY-3 [place] and reviewed.  Ila Mcgill, MD

## 2014-07-22 NOTE — Patient Instructions (Addendum)
Do gentle heel raises Avoid hard of stretching the Achilles Experiment with applying some gentle heat after runs Try capsaicin cream

## 2014-10-08 ENCOUNTER — Encounter: Payer: Self-pay | Admitting: Gastroenterology

## 2014-10-30 ENCOUNTER — Ambulatory Visit (INDEPENDENT_AMBULATORY_CARE_PROVIDER_SITE_OTHER): Payer: 59 | Admitting: Sports Medicine

## 2014-10-30 ENCOUNTER — Encounter: Payer: Self-pay | Admitting: Sports Medicine

## 2014-10-30 VITALS — BP 133/68 | HR 69 | Ht 60.0 in | Wt 130.0 lb

## 2014-10-30 DIAGNOSIS — IMO0001 Reserved for inherently not codable concepts without codable children: Secondary | ICD-10-CM

## 2014-10-30 DIAGNOSIS — M7661 Achilles tendinitis, right leg: Secondary | ICD-10-CM | POA: Diagnosis not present

## 2014-10-30 DIAGNOSIS — S8991XA Unspecified injury of right lower leg, initial encounter: Secondary | ICD-10-CM

## 2014-10-30 DIAGNOSIS — S86809A Unspecified injury of other muscle(s) and tendon(s) at lower leg level, unspecified leg, initial encounter: Secondary | ICD-10-CM | POA: Insufficient documentation

## 2014-10-30 HISTORY — DX: Unspecified injury of other muscle(s) and tendon(s) at lower leg level, unspecified leg, initial encounter: S86.809A

## 2014-10-30 NOTE — Assessment & Plan Note (Signed)
Recent irritation in the race appears to be a partial plantaris tear  This continues to give her some Achilles symptoms  I think a period of rest and rehabilitation as a smart plan and will recheck her in 3 months

## 2014-10-30 NOTE — Patient Instructions (Signed)
  You have a partial plantaris tendon injury  Good heal support Calf raises Ice and rest  Cross training to avoid pain, add in some strength exercises

## 2014-10-30 NOTE — Progress Notes (Signed)
Patient ID: Rhonda [name], female   DOB: 19-Oct-1950, 64 y.o.   MRN: [id number]     HPI  Patient presents today for R heel pain  Patient has a several month Hx fo achilles tendinopathy. She is recently ran a half marathon when she developed acute posterior heel pain at mile 8. She finished the race and had a small bruise develop at the area of concern then. She had stiffness, pain, and difficulty using the ankle/heel/foot after that for a few days. Today she is largely pain free and the bruise has resolved.   She is willing to take the summer off to try and heal.   She has a prolonged exposure to raloxifene previously  PMH: Smoking status noted - never ROS: Per HPI  Objective: BP 133/68 mmHg  Pulse 69  Ht 5' (1.524 m)  Wt 130 lb (58.968 kg)  BMI 25.39 kg/m2 Gen: NAD, alert, cooperative with exam HEENT: NCAT Ext: No edema, warm Neuro: Alert and oriented, No gross deficits MSK:  R heel without gross deformity, bruise, or lesion Slight tenderness to palpation where achilles inserts to the calcaneous No joint laxity  MSK [place] The AT looks intact Small calcification at insertion of AT to calcaneus Area of plantaris insertion has some hypoechoic change   Assessment and plan:  Injury of plantaris muscle or tendon Partial avulsion of plantaris tendon after a half marathon She has been struggling with achilles tendinopathy for months Discussed HEP, ice, and taking the summer off from running Cross training Follow up PRN

## 2014-10-30 NOTE — Assessment & Plan Note (Signed)
Partial avulsion of plantaris tendon after a half marathon She has been struggling with achilles tendinopathy for months Discussed HEP, ice, and taking the summer off from running Cross training Follow up PRN

## 2015-03-10 ENCOUNTER — Other Ambulatory Visit: Payer: Self-pay

## 2015-03-10 DIAGNOSIS — Z1231 Encounter for screening mammogram for malignant neoplasm of breast: Secondary | ICD-10-CM

## 2015-04-20 ENCOUNTER — Ambulatory Visit
Admission: RE | Admit: 2015-04-20 | Discharge: 2015-04-20 | Disposition: A | Discharge status: Home / Self Care | Payer: 59 | Source: Ambulatory Visit

## 2015-04-20 DIAGNOSIS — Z1231 Encounter for screening mammogram for malignant neoplasm of breast: Secondary | ICD-10-CM

## 2015-04-22 ENCOUNTER — Emergency Department (HOSPITAL_COMMUNITY)
Admission: EM | Admit: 2015-04-22 | Discharge: 2015-04-22 | Disposition: A | Discharge status: Home / Self Care | Payer: 59 | Source: Home / Self Care | Attending: Family Medicine | Admitting: Family Medicine

## 2015-04-22 ENCOUNTER — Encounter (HOSPITAL_COMMUNITY): Payer: Self-pay | Admitting: *Deleted

## 2015-04-22 DIAGNOSIS — J069 Acute upper respiratory infection, unspecified: Secondary | ICD-10-CM

## 2015-04-22 MED ORDER — DOXYCYCLINE HYCLATE 100 MG PO CAPS: 100.0000 mg | ORAL_CAPSULE | Freq: Two times a day (BID) | ORAL | Status: DC

## 2015-04-22 MED ORDER — IPRATROPIUM BROMIDE 0.06 % NA SOLN: 2.0000 | Freq: Four times a day (QID) | NASAL | Status: DC

## 2015-04-22 NOTE — ED Provider Notes (Signed)
CSN: [id number]     Arrival date & time 04/22/15  1254 History   First MD Initiated Contact with Patient 04/22/15 1312     Chief Complaint  Patient presents with  . URI   (Consider location/radiation/quality/duration/timing/severity/associated sxs/prior Treatment) Patient is a 64 y.o. female presenting with URI. The history is provided by the patient.  URI Presenting symptoms: congestion, facial pain and rhinorrhea   Presenting symptoms: no fever and no sore throat   Severity:  Mild Onset quality:  Gradual Duration:  4 days Progression:  Unchanged Chronicity:  Recurrent Relieved by:  None tried Worsened by:  Nothing tried Ineffective treatments:  None tried Associated symptoms: sinus pain   Associated symptoms: no sneezing     Past Medical History  Diagnosis Date  . High cholesterol    Past Surgical History  Procedure Laterality Date  . Tonsillectomy    . Nasal sinus surgery    . Mandible fracture surgery    . Knee surgery    . Abdominal surgery    . Abdominal surgery      for removal of abdominal mass-cancerous   History reviewed. No pertinent family history. Social History  Substance Use Topics  . Smoking status: Never Smoker   . Smokeless tobacco: None  . Alcohol Use: Yes   OB History    No data available     Review of Systems  Constitutional: Negative.  Negative for fever.  HENT: Positive for congestion, postnasal drip, rhinorrhea and sinus pressure. Negative for sneezing and sore throat.   Skin: Negative.   All other systems reviewed and are negative.   Allergies  Acetaminophen; Aspirin; Nsaids; and Pravastatin  Home Medications   Prior to Admission medications   Medication Sig Start Date End Date Taking? Authorizing Provider  albuterol (PROVENTIL HFA) 108 (90 BASE) MCG/ACT inhaler Inhale 2 puffs into the lungs every 6 (six) hours as needed for wheezing.    Historical Provider, MD  doxycycline (VIBRAMYCIN) 100 MG capsule Take 1 capsule (100 mg  total) by mouth 2 (two) times daily. 04/22/15   Billy Fischer, MD  ipratropium (ATROVENT) 0.06 % nasal spray Place 2 sprays into both nostrils 4 (four) times daily. 04/22/15   Billy Fischer, MD  Pitavastatin Calcium (LIVALO) 2 MG TABS Take 1 tablet by mouth daily.    Historical Provider, MD  rosuvastatin (CRESTOR) 10 MG tablet Take 10 mg by mouth. 01/30/14   Historical Provider, MD  traMADol (ULTRAM) 50 MG tablet Take 1 tablet (50 mg total) by mouth every 6 (six) hours as needed for pain. Patient not taking: Reported on 04/17/2014 04/13/12   Stefanie Libel, MD   Meds Ordered and Administered this Visit  Medications - No data to display  BP 122/73 mmHg  Pulse 74  Temp(Src) 97.6 F (36.4 C) (Oral)  Resp 18  SpO2 98% No data found.   Physical Exam  Constitutional: She is oriented to person, place, and time. She appears well-developed and well-nourished.  HENT:  Head: Normocephalic.  Right Ear: External ear normal.  Left Ear: External ear normal.  Nose: Mucosal edema and rhinorrhea present. Right sinus exhibits maxillary sinus tenderness. Left sinus exhibits maxillary sinus tenderness.  Mouth/Throat: Oropharynx is clear and moist.  Neck: Normal range of motion. Neck supple.  Cardiovascular: Regular rhythm.   Pulmonary/Chest: Breath sounds normal.  Lymphadenopathy:    She has no cervical adenopathy.  Neurological: She is alert and oriented to person, place, and time.  Skin: Skin is warm and  dry.  Nursing note and vitals reviewed.   ED Course  Procedures (including critical care time)  Labs Review Labs Reviewed - No data to display  Imaging Review No results found.   Visual Acuity Review  Right Eye Distance:   Left Eye Distance:   Bilateral Distance:    Right Eye Near:   Left Eye Near:    Bilateral Near:         MDM   1. URI (upper respiratory infection)        Billy Fischer, MD 04/23/15 1326

## 2015-04-22 NOTE — Discharge Instructions (Signed)
Drink plenty of fluids as discussed, use medicine as prescribed, and mucinex and saline nose spray as discussed. Return or see your doctor if further problems

## 2015-04-22 NOTE — ED Notes (Signed)
Pt reports    Symptoms  Of  Congestion      And     Symptoms          Of  Sinus  Drainage    And  Tingling  l  Ear        Pt  Has  A  History  Of  Sinus  Infections  In  Past

## 2015-05-26 DIAGNOSIS — Z13820 Encounter for screening for osteoporosis: Secondary | ICD-10-CM | POA: Diagnosis not present

## 2015-06-22 DIAGNOSIS — H524 Presbyopia: Secondary | ICD-10-CM | POA: Diagnosis not present

## 2015-06-22 DIAGNOSIS — H52201 Unspecified astigmatism, right eye: Secondary | ICD-10-CM | POA: Diagnosis not present

## 2015-06-22 DIAGNOSIS — H5212 Myopia, left eye: Secondary | ICD-10-CM | POA: Diagnosis not present

## 2015-06-22 DIAGNOSIS — H52202 Unspecified astigmatism, left eye: Secondary | ICD-10-CM | POA: Diagnosis not present

## 2015-06-22 DIAGNOSIS — H2513 Age-related nuclear cataract, bilateral: Secondary | ICD-10-CM | POA: Diagnosis not present

## 2015-06-23 DIAGNOSIS — D225 Melanocytic nevi of trunk: Secondary | ICD-10-CM | POA: Diagnosis not present

## 2015-06-23 DIAGNOSIS — L570 Actinic keratosis: Secondary | ICD-10-CM | POA: Diagnosis not present

## 2015-06-23 DIAGNOSIS — Z85828 Personal history of other malignant neoplasm of skin: Secondary | ICD-10-CM | POA: Diagnosis not present

## 2015-07-27 DIAGNOSIS — E782 Mixed hyperlipidemia: Secondary | ICD-10-CM | POA: Diagnosis not present

## 2015-07-27 MED FILL — ROSUVASTATIN CALCIUM 10 MG: 10 | 90 days supply | Qty: 90 | Fill #2

## 2015-07-29 DIAGNOSIS — H905 Unspecified sensorineural hearing loss: Secondary | ICD-10-CM | POA: Diagnosis not present

## 2015-07-29 DIAGNOSIS — D696 Thrombocytopenia, unspecified: Secondary | ICD-10-CM | POA: Diagnosis not present

## 2015-07-29 DIAGNOSIS — E785 Hyperlipidemia, unspecified: Secondary | ICD-10-CM | POA: Diagnosis not present

## 2015-10-19 MED FILL — ROSUVASTATIN CALCIUM 10 MG: 10 | 90 days supply | Qty: 90 | Fill #3

## 2016-01-14 MED FILL — ROSUVASTATIN CALCIUM 10 MG: 10 | 90 days supply | Qty: 90 | Fill #0

## 2016-01-25 DIAGNOSIS — E785 Hyperlipidemia, unspecified: Secondary | ICD-10-CM | POA: Diagnosis not present

## 2016-01-29 DIAGNOSIS — E785 Hyperlipidemia, unspecified: Secondary | ICD-10-CM | POA: Diagnosis not present

## 2016-01-29 DIAGNOSIS — R7309 Other abnormal glucose: Secondary | ICD-10-CM | POA: Diagnosis not present

## 2016-01-29 DIAGNOSIS — J4520 Mild intermittent asthma, uncomplicated: Secondary | ICD-10-CM | POA: Diagnosis not present

## 2016-01-29 MED FILL — VENTOLIN HFA 90 MCG INHALER: 108 (90 BAS | 30 days supply | Qty: 18 | Fill #0

## 2016-02-02 ENCOUNTER — Other Ambulatory Visit: Payer: Self-pay | Admitting: Obstetrics and Gynecology

## 2016-02-02 DIAGNOSIS — Z1231 Encounter for screening mammogram for malignant neoplasm of breast: Secondary | ICD-10-CM

## 2016-04-06 ENCOUNTER — Ambulatory Visit: Payer: Self-pay

## 2016-04-06 ENCOUNTER — Ambulatory Visit (INDEPENDENT_AMBULATORY_CARE_PROVIDER_SITE_OTHER): Payer: 59 | Admitting: Sports Medicine

## 2016-04-06 ENCOUNTER — Encounter: Payer: Self-pay | Admitting: Sports Medicine

## 2016-04-06 VITALS — BP 159/83 | HR 61 | Ht 60.0 in | Wt 125.0 lb

## 2016-04-06 DIAGNOSIS — M79671 Pain in right foot: Secondary | ICD-10-CM

## 2016-04-06 HISTORY — DX: Pain in right foot: M79.671

## 2016-04-06 NOTE — Progress Notes (Signed)
  Rhonda [name] - 65 y.o. female MRN [id number]  Date of birth: [birth year]  SUBJECTIVE:  Including CC & ROS.   Rhonda [name] is a 65 yo F that is presenting with right foot pain. The pain starting on Monday while she was wearing a new pairs of shoes. She went for a run and noticed pain at the 1/4 miles mark and had to stop running after a mile. She was limping when she had to limp back. She is having pain on the medial aspect of her right foot. She noticed some bruising on the bottom of her heel. She had significant pain last night and it woke her up from her sleep. She feels improvement of her pain when she wears a tight fitting shoe. She has a history of multiple stress fractures. She has a history of taking tamoxifen for her breast cancer treatment. She just finished her last marathon in October and is starting to train for her next one in March. She has some altered sensation on the dorsal aspect of her foot. Denies any numbness. She denies any osteopenia or osteoporosis.   ROS: No unexpected weight loss, fever, chills, instability, muscle pain, numbness/tingling, redness, otherwise see HPI    HISTORY: Past Medical, Surgical, Social, and Family History Reviewed & Updated per EMR.   Pertinent Historical Findings include: PMSHx -  Allergic rhinitis, basal cell carcinoma, breast cancer  PSHx -  No alcohol or tobacco use  FHx -  Osteoporosis   DATA REVIEWED: None to review   PHYSICAL EXAM:  VS: BP:(!) 159/83  HR:61bpm  TEMP: ( )  RESP:   HT:5' (152.4 cm)   WT:125 lb (56.7 kg)  BMI:24.5 PHYSICAL EXAM: Gen: NAD, alert, cooperative with exam, well-appearing HEENT: clear conjunctiva, EOMI CV:  no edema, capillary refill brisk,  Resp: non-labored, normal speech Skin: no rashes, normal turgor  Neuro: no gross deficits.  Psych:  alert and oriented Right Foot:  Obvious ecchymosis on the plantar aspect of her right heel.  TTP of the medial aspect of her right heel.  No TTP of the insertion  of the achilles.  No TTP of the mid-belly of the achilles.  Normal ROM of the ankle  Normal strength  No TTP of the posterior tib  Negative Tinel's in the tarsal tunnel.  Limping with barefoot walking   Limited US: right foot: The PT was viewed in short axis and found to be normal. It was followed olbiquely into its insertion into the navicular which was normal. The medial calcaneous was scanned and there was an area of hypoechoic change to suggest edema in this area. The insertion of the achilles had a spur and calcific changes but otherwise normal.   Findings consistent with an acute stress reaction of the calcaneous.   ASSESSMENT & PLAN:   Right foot pain It appears that she has stress reaction to the medial aspect of her calcaneous.  Most likely a grade 1 bone injury.  She has a significant limp and tenderness on exam. [place] is consistent with edema in this area.  - try body helix  - f/u in three weeks.

## 2016-04-06 NOTE — Assessment & Plan Note (Signed)
It appears that she has stress reaction to the medial aspect of her calcaneous.  Most likely a grade 1 bone injury.  She has a significant limp and tenderness on exam. [place] is consistent with edema in this area.  - try body helix  - f/u in three weeks.

## 2016-04-08 ENCOUNTER — Other Ambulatory Visit: Payer: Self-pay | Admitting: *Deleted

## 2016-04-08 ENCOUNTER — Ambulatory Visit (INDEPENDENT_AMBULATORY_CARE_PROVIDER_SITE_OTHER): Payer: 59 | Admitting: Family Medicine

## 2016-04-08 ENCOUNTER — Encounter: Payer: Self-pay | Admitting: Family Medicine

## 2016-04-08 VITALS — BP 155/78 | Ht 60.0 in | Wt 125.0 lb

## 2016-04-08 DIAGNOSIS — M79673 Pain in unspecified foot: Secondary | ICD-10-CM | POA: Diagnosis not present

## 2016-04-08 MED ORDER — TRAMADOL HCL 50 MG PO TABS
50.0000 mg | ORAL_TABLET | Freq: Two times a day (BID) | ORAL | 0 refills | Status: DC | PRN
Start: 2016-04-08 — End: 2016-12-13

## 2016-04-08 MED FILL — traMADol HCL 50 MG TABS: 50 | 30 days supply | Qty: 60 | Fill #0

## 2016-04-08 NOTE — Progress Notes (Signed)
  Rhonda [name] - 65 y.o. female MRN [id number]  Date of birth: 1950-09-09  SUBJECTIVE:  Including CC & ROS.  CC: Right heel pain   Rhonda [name] is a 65yo female presenting with right heel pain. Pt was recently seen by Dr. Oneida Alar on 04/06/16 for right heal pain that started while running at 0.25 mark. Pt was wearing running shoes that caused her to pronate more. She was told she had a stress reaction of the medial calcaneous and was given a body helix and prescribed tramadol. Pt reports that her pain has continued but not in same exact spot it was initially (more lateral).  She has not used the tramadol due to being "sensitive" to pain medications. Pain is aggravated by bearing weight. Pt walks with limp and has used cane to assist her with weight bearing today. She plans to participate in a marathon in March 2018.   ROS: No numbness No fever/chills No weakness Bruising present    HISTORY: Past Medical, Surgical, Social, and Family History Reviewed & Updated per EMR.   Pertinent Historical Findings include: PMSHx -  Basal cell carcinoma, breast CA PSHx -  No alcohol or tobacco use FHx -  Osteoporosis   DATA REVIEWED: Reviewed 04/06/16 right foot U/S findings   PHYSICAL EXAM:  VS: BP:(!) 155/78  HR: bpm  TEMP: ( )  RESP:   HT:5' (152.4 cm)   WT:125 lb (56.7 kg)  BMI:24.5 PHYSICAL EXAM: Gen: NAD, alert, cooperative with exam, well-appearing Skin: normal turgor  Neuro: no gross deficits.  Psych:  alert and oriented Right heel: Mild ecchymosis on plantar surface. Mild edema present. No erythema. Tender to palpation at central aspect of calcaneous  Nl ROM of ankle No TTP of right achilles Nl strength     ASSESSMENT & PLAN:  Right heel pain: Today U/S findings consistent with U/S from 2 days ago. Edema present at medial calcaneous. Symptoms consistent with plantar nerve irritation due to location and mechanism of running in new shoes resulting inflammation.  -Inserted  heel lift to relieve pressure  -Recommend pt apply heat and alternate with cold but to limit cold compress exposure to prevent nerve irritation.  -Follow up as needed. If symptoms do not improve could consider injection at site to decrease inflammation.

## 2016-04-10 NOTE — Progress Notes (Signed)
[place] Attending Note: I have seen and examined this patient with the medical student. I have  reviewed the history, physical examination, assessment and plan as documented in the medical student's note.  I agree with the medical student's note and findings, assessment and treatment plan as documented with the following additions or changes: She has a calcaneal contusion diagnosed by Dr Oneida Alar. I think she also has a stretch injury of the plantar nerve (where it passes below medial malleolus and before it bifurcates into medial and lateral plantar nerves). Likely caused by the new shoes she wore. We inserted a medial to lateral heel wedge to offload this area of nerve for the next few days or weeks. Immediately after putting it in her shoe she was able to walk more normally. I think it also adds some padding for the calcaneus,

## 2016-04-11 MED FILL — ROSUVASTATIN CALCIUM 10 MG: 10 | 90 days supply | Qty: 90 | Fill #1

## 2016-04-12 ENCOUNTER — Ambulatory Visit: Payer: 59 | Admitting: Sports Medicine

## 2016-04-28 ENCOUNTER — Ambulatory Visit: Payer: 59 | Admitting: Sports Medicine

## 2016-05-03 ENCOUNTER — Ambulatory Visit
Admission: RE | Admit: 2016-05-03 | Discharge: 2016-05-03 | Disposition: A | Discharge status: Home / Self Care | Payer: 59 | Source: Ambulatory Visit | Attending: Obstetrics and Gynecology | Admitting: Obstetrics and Gynecology

## 2016-05-03 DIAGNOSIS — Z1231 Encounter for screening mammogram for malignant neoplasm of breast: Secondary | ICD-10-CM | POA: Diagnosis not present

## 2016-05-04 DIAGNOSIS — Z01419 Encounter for gynecological examination (general) (routine) without abnormal findings: Secondary | ICD-10-CM | POA: Diagnosis not present

## 2016-05-04 DIAGNOSIS — Z6826 Body mass index (BMI) 26.0-26.9, adult: Secondary | ICD-10-CM | POA: Diagnosis not present

## 2016-06-23 DIAGNOSIS — Z85828 Personal history of other malignant neoplasm of skin: Secondary | ICD-10-CM | POA: Diagnosis not present

## 2016-06-23 DIAGNOSIS — L814 Other melanin hyperpigmentation: Secondary | ICD-10-CM | POA: Diagnosis not present

## 2016-06-23 DIAGNOSIS — Z23 Encounter for immunization: Secondary | ICD-10-CM | POA: Diagnosis not present

## 2016-06-23 DIAGNOSIS — D2271 Melanocytic nevi of right lower limb, including hip: Secondary | ICD-10-CM | POA: Diagnosis not present

## 2016-06-23 DIAGNOSIS — D1800 Hemangioma unspecified site: Secondary | ICD-10-CM | POA: Diagnosis not present

## 2016-06-23 DIAGNOSIS — L821 Other seborrheic keratosis: Secondary | ICD-10-CM | POA: Diagnosis not present

## 2016-06-23 DIAGNOSIS — D225 Melanocytic nevi of trunk: Secondary | ICD-10-CM | POA: Diagnosis not present

## 2016-07-05 MED FILL — ROSUVASTATIN CALCIUM 10 MG: 10 | 90 days supply | Qty: 90 | Fill #2

## 2016-07-27 DIAGNOSIS — E785 Hyperlipidemia, unspecified: Secondary | ICD-10-CM | POA: Diagnosis not present

## 2016-07-27 DIAGNOSIS — D696 Thrombocytopenia, unspecified: Secondary | ICD-10-CM | POA: Diagnosis not present

## 2016-08-01 DIAGNOSIS — H5212 Myopia, left eye: Secondary | ICD-10-CM | POA: Diagnosis not present

## 2016-08-01 DIAGNOSIS — H04123 Dry eye syndrome of bilateral lacrimal glands: Secondary | ICD-10-CM | POA: Diagnosis not present

## 2016-08-01 DIAGNOSIS — H52221 Regular astigmatism, right eye: Secondary | ICD-10-CM | POA: Diagnosis not present

## 2016-08-01 DIAGNOSIS — H2513 Age-related nuclear cataract, bilateral: Secondary | ICD-10-CM | POA: Diagnosis not present

## 2016-08-01 DIAGNOSIS — H524 Presbyopia: Secondary | ICD-10-CM | POA: Diagnosis not present

## 2016-08-01 DIAGNOSIS — H52202 Unspecified astigmatism, left eye: Secondary | ICD-10-CM | POA: Diagnosis not present

## 2016-08-03 DIAGNOSIS — D696 Thrombocytopenia, unspecified: Secondary | ICD-10-CM | POA: Diagnosis not present

## 2016-08-03 DIAGNOSIS — J4520 Mild intermittent asthma, uncomplicated: Secondary | ICD-10-CM | POA: Diagnosis not present

## 2016-08-03 DIAGNOSIS — E785 Hyperlipidemia, unspecified: Secondary | ICD-10-CM | POA: Diagnosis not present

## 2016-08-03 DIAGNOSIS — R7309 Other abnormal glucose: Secondary | ICD-10-CM | POA: Diagnosis not present

## 2016-09-07 DIAGNOSIS — L237 Allergic contact dermatitis due to plants, except food: Secondary | ICD-10-CM | POA: Diagnosis not present

## 2016-09-07 MED FILL — CLOBETASOL 0.05% OINTMENT: 0.05 | 30 days supply | Qty: 60 | Fill #0

## 2016-09-08 ENCOUNTER — Ambulatory Visit (INDEPENDENT_AMBULATORY_CARE_PROVIDER_SITE_OTHER): Payer: 59 | Admitting: Sports Medicine

## 2016-09-08 ENCOUNTER — Ambulatory Visit: Payer: Self-pay

## 2016-09-08 DIAGNOSIS — M7582 Other shoulder lesions, left shoulder: Secondary | ICD-10-CM | POA: Diagnosis not present

## 2016-09-08 DIAGNOSIS — M25512 Pain in left shoulder: Secondary | ICD-10-CM

## 2016-09-08 HISTORY — DX: Other shoulder lesions, left shoulder: M75.82

## 2016-09-08 MED ORDER — PREDNISONE 10 MG PO TABS: ORAL_TABLET | ORAL | 0 refills | Status: DC

## 2016-09-08 MED FILL — predniSONE 10 MG TABS: 10 | 12 days supply | Qty: 48 | Fill #0

## 2016-09-08 NOTE — Assessment & Plan Note (Addendum)
Recommend Job exercises and prednisone to help with pain.  She will do these for 6 weeks and follow up at that time.

## 2016-09-08 NOTE — Progress Notes (Signed)
  Rhonda [name] - 66 y.o. female MRN [id number]  Date of birth: January 13, 1951  SUBJECTIVE:  Including CC & ROS.  CC: left shoulder pain  Presents with 2.5 months of left shoulder pain that began while she was trying to lift her elderly father and felt immediate pain in her left shoulder.  She has had difficulty with ROM and strength, but it has been improving.  Denies neck pain, numbness or tingling.    Of note, recently diagnosed with poison ivy and had steroid injection yesterday without relief.   ROS: No unexpected weight loss, fever, chills, swelling, instability, muscle pain, numbness/tingling, redness, otherwise see HPI   PMHx - Updated and reviewed.  Contributory factors include: h/o breast cancer PSHx - Updated and reviewed.  Contributory factors include:  Negative FHx - Updated and reviewed.  Contributory factors include:  Negative Social Hx - Updated and reviewed. Contributory factors include: Negative Medications - reviewed   DATA REVIEWED: none  PHYSICAL EXAM:  VS: BP:   HR: bpm  TEMP: ( )  RESP:   HT:    WT:   BMI:  PHYSICAL EXAM: Gen: NAD, alert, cooperative with exam, well-appearing HEENT: clear conjunctiva,  CV:  no edema, capillary refill brisk, normal rate Resp: non-labored Skin: no rashes, normal turgor  Neuro: no gross deficits.  Psych:  alert and oriented  Shoulder: Inspection reveals no abnormalities, atrophy or asymmetry. Palpation is normal with no tenderness over AC joint or bicipital groove. ROM is decreased to 160 degrees in flexion and 100 degrees in abduction, otherwise full ROM. Rotator cuff strength decreased 2/2 pain throughout. + signs of impingement with +Neer and Hawkin's tests, empty can sign. Speeds and Yergason's tests normal. No labral pathology noted with negative Obrien's, negative clunk and good stability. + painful arc and no drop arm sign. No apprehension sign  Ultrasound: Limited ultrasound of left shoulder   Biceps tendon  viewed and long and short axis with good fibrillary pattern and no tearing Subscapularis with good fibrillary pattern and no tearing, no impingement under coracoid on external and internal rotation Supraspinatus with good footprint and no tearing seen, reviewed anterior to posterior and transverse, did see some fluid within bursa Dynamic scanning with impingement seen Infraspinatus and teres minor without tearing and good fibrillary pattern. Posterior labrum and GH joint without effusion, degeneration, or spurring. AC joint without narrowing or effusion.  Summary: Findings consistent with rotator cuff tendinitis.  Ultrasound performed and interpreted by Melton Krebs, DO      ASSESSMENT & PLAN:   Rotator cuff tendinitis, left Recommend Job exercises and prednisone to help with pain.  She will do these for 6 weeks and follow up at that time.

## 2016-09-13 ENCOUNTER — Ambulatory Visit: Payer: 59 | Admitting: Sports Medicine

## 2016-10-04 MED FILL — ROSUVASTATIN CALCIUM 10 MG: 10 | 90 days supply | Qty: 90 | Fill #3

## 2016-10-11 DIAGNOSIS — H1132 Conjunctival hemorrhage, left eye: Secondary | ICD-10-CM | POA: Diagnosis not present

## 2016-10-27 ENCOUNTER — Encounter: Payer: Self-pay | Admitting: Sports Medicine

## 2016-10-27 ENCOUNTER — Ambulatory Visit (INDEPENDENT_AMBULATORY_CARE_PROVIDER_SITE_OTHER): Payer: 59 | Admitting: Sports Medicine

## 2016-10-27 DIAGNOSIS — M7582 Other shoulder lesions, left shoulder: Secondary | ICD-10-CM | POA: Diagnosis not present

## 2016-10-27 NOTE — Progress Notes (Signed)
   Subjective:    Patient ID: Rhonda [name], female    DOB: 20-Jan-1951, 66 y.o.   MRN: [id number]  HPI  Mrs. Geppert is a 66 yo female is who is here for follow up for Left rotator cuff tendinitis. She was seen on 5/3 for this and was given 14 day prednisone taper and ROM exercises. At that time a limited US of the left shoulder was done which did not show signs of tendon tear. She reports she is now 70% improved. She completed that taper and has been doing the home exercises as well as working with a Fish farm manager which has helped significantly. Her range of motion has improved but not quite back to baseline yet. Her anterior shoulder pain has resolved and now only has some pain deep in the shoulder. No numbness or tingling of the left upper extremity or neck pain. The night time pain has resolved. Reports she only has pain if she lays on her left shoulder for a prolonged period of time.   Review of Systems As noted above No neck pain No radicular sxs    Objective:   Physical Exam Gen: NAD BP 126/84   Ht 5' (1.524 m)   Wt 125 lb (56.7 kg)   BMI 24.41 kg/m   Neck: Full neck range of motion  Shoulder: Inspection reveals no abnormalities, atrophy or asymmetry. Palpation is normal with no tenderness over AC joint or bicipital groove. Passive ROM is limited on the left shoulder abduction to 150 degrees, otherwise normal  Rotator cuff strength normal throughout. Negative Neer test and Hawkins test. Some discomfort with empty can test but improved from before.  Normal scapular function observed.     Assessment & Plan:  Rotator cuff tendinitis, left, improving:  - continue range of motion exercises  - follow up as needed or if symptoms do not resolve in about 6 weeks. If symptoms persist will plan to do imaging again.   Smiley Houseman, MD PGY 2 Family Medicine   I observed and examined the patient with the resident and agree with assessment and plan.  Note reviewed and  modified by me. Stefanie Libel, MD

## 2016-10-27 NOTE — Assessment & Plan Note (Signed)
Significant improvement  See OV summary

## 2016-11-29 ENCOUNTER — Ambulatory Visit: Payer: 59 | Admitting: Family Medicine

## 2016-12-08 ENCOUNTER — Other Ambulatory Visit: Payer: 59 | Admitting: Sports Medicine

## 2016-12-13 ENCOUNTER — Ambulatory Visit (INDEPENDENT_AMBULATORY_CARE_PROVIDER_SITE_OTHER): Payer: 59 | Admitting: Family Medicine

## 2016-12-13 DIAGNOSIS — R7309 Other abnormal glucose: Secondary | ICD-10-CM | POA: Diagnosis not present

## 2016-12-13 NOTE — Progress Notes (Signed)
Medical Nutrition Therapy:  Appt start time: 1000 end time:  1100.  Assessment:  Primary concerns today: Weight management and Blood sugar control.   Learning Readiness: Change in progress Rhonda [name]'s BG was 110 in March of 2017, 112 in Sept 2017, then 114 in March 2018.  Her weight was 136 lb at that time, so Rhonda [name] started to work on weight loss with diet and exercise efforts.  Two weeks ago, she joined [employer], which she has been following "to the T," and started a 3-wk boot camp.  She feels discouraged b/c she's seen a weight gain in the past couple of weeks, although she has also seen a decrease in waist circumference.    She is assigned 23 Wt [employer] points, but usually eats only 17-19 points/day.    Usual eating pattern includes 3 meals and 2-3 snacks per day.  Usually eats restaurant food for lunch; walks to [employer] ~3/10 mile.   Frequent foods and beverages include chx, fish, pork, veg's, fruit, water, 10 oz decaf coffee/day, unsweetened iced tea.  Avoided foods include red meat, cauliflower and leeks (anaphylaxis/chest pain).   Usual physical activity includes 60 min of boot camp 6 X wk, 60 min run/walk 6 X wk, shoulder rehab ex's (15 min) ~3 X wk.  After the next week, she will go back to 45-min personal trainer workouts 2 X wk and 60 min run/walk 5 X wk.    24-hr recall: (Up at 5 AM) 6 AM: Boot camp workout  B (8:15 AM)-   1 pkt apples&cinnamon oatmeal, 1 c cantaloupe, 10 oz decaf, 1/2 tsp sugar Snk (9 AM)-   1/4 cantaloupe, water  L (12 PM)-  Pita Delight: chx, 1 c lettuce, feta, 1 tbsp vinaigrette drsng, unswt iced tea Snk (2:30)-  1 c cherries Ran 3 mi, walked 3 mi; 16 oz water   D (7 PM)-  [employer] chili, 4 chx nuggets, water  (ate in the car)  Snk ( PM)-  --- Typical day? Yes.  Except takeout for dinner is unusual.    Progress Towards Goal(s):  In progress.   Nutritional Diagnosis:  NI-5.8.3 Inappropriate intake of types of carbohydrates (specify):   As related to  blood glucose control.  As evidenced by usual breakfast of sweetened instant oatmeal.  In addition, greater veg intake would likely benefit BG control.     Intervention: Nutrition education  Handouts given during visit include:  AVS  Demonstrated degree of understanding via:  Teach Back  Barriers to learning/adherence to lifestyle change: Habitual food choices.  Rhonda [name] is highly motivated, however, and I believe she will make a strong effort.    Monitoring/Evaluation:  Dietary intake, exercise, and body weight in 4 week(s).

## 2016-12-13 NOTE — Patient Instructions (Signed)
1. Oatmeal for breakfast:  Use cooked oats instead of instant.  Make a big batch of steel-cut oats of thick rolled oats, and reheat in the morning along with some added milk.    - Oatmeal add-ons: cinnamon, any nuts/seeds, milk, berries (or other fruit), 2-4 tbsp of All Bran Original (adding fiber, protein, and high-quality fat, each of which slows down digestion, thereby reducing glycemic response).    - Alternative breakfasts: 2 eggs with 2 slc bacon; yogurt (half unsweetened and half vanilla) with fresh fruit and nuts/seeds.  2. Obtain twice as many veg's as protein or carbohydrate foods for both lunch and dinner.  - If you do get takeout for dinner, bring it HOME, and add a vegetable if your takeout meal doesn't include one.    - When traveling, and bringing a sandwich, be sure to also include some veg's (i.e., tomatoes, cucumber, bell pepper strips) and maybe a piece of fruit.   - Meals should look like, taste like, and feel like a real meal.    Saturday Caramel Macchiato: Start asking for this drink with fewer pumps of syrup.  Consistency is crucial to changing taste preferences.    High-glycemic fruits to limit to one moderate portion at a time: watermelon, oranges, grapes, pineapple, mango, papaya, banana.     Low- or moderate-glycemic fruits: apple, plums, peaches, pears, all berries, cantaloupe, honey dew.

## 2016-12-30 MED FILL — ROSUVASTATIN CALCIUM 10 MG: 10 | 90 days supply | Qty: 90 | Fill #0

## 2017-01-10 ENCOUNTER — Ambulatory Visit (INDEPENDENT_AMBULATORY_CARE_PROVIDER_SITE_OTHER): Payer: 59 | Admitting: Family Medicine

## 2017-01-10 ENCOUNTER — Encounter: Payer: Self-pay | Admitting: Family Medicine

## 2017-01-10 DIAGNOSIS — R7309 Other abnormal glucose: Secondary | ICD-10-CM

## 2017-01-10 NOTE — Patient Instructions (Addendum)
-   Remember that food satisfaction is extremely important to managing appetite.  Every meal should look like, taste like, and feel like a real meal!  For example, if you love a [place] sandwich for lunch, have it!  But:  What will you have with it?  Mustard and tomato on the sandwich; bell pepper strips (and/or other raw veg's); and fruit.  Experiment with bread, e.g., wraps, hearty whole-grain breads, bagel thins, bagels, English muffins.    - Loosening up your carb restriction may actually be what you need for successful weight management.  This might be at least one carb serving per meal.  You will still want to limit carb's because of effects on blood glucose, but a moderate amount may be beneficial overall.  Attention to high-quality carb's (= those with FIBER) will also be important.  (One carb portion = 1 slice of bread or its equivalent; 1/2 cup of a "scoopable" carb; or 15 grams of Total Carb as shown on nutrition facts panel.)  - Also keep in mind the concept of food habituation, which means when food is still novel, we are MUCH more interested in it, so giving yourself permission to have some sweets now and then may make them less "novel."    - At the same time, you can work on modifying your taste preferences, as we talked about before, like SLOWLY reducing the amount of sweet syrup in your caramel machiato.  The key to changing taste preferences is to go slowly and to be consistent.    - Use the meal planning form provided today to come up with some meals to add variety to your usual intake.  Use these planned meals as a basis for shopping each week, so you can make any meal on your list.    - Continue to include twice as many veg's as protein or carbohydrate foods for both lunch and dinner.

## 2017-01-10 NOTE — Progress Notes (Signed)
Medical Nutrition Therapy:  Appt start time: 1000 end time:  1100.  Assessment:  Primary concerns today: Weight management and Blood sugar control.   Learning Readiness: Change in progress [name] has continued following the Weight Watchers program (assigned 23 Wt Watchers points, but usually eats only 19-21  points/day).  She felt she'd been doing well, having lost 5 lb, but only through some very hard work.  (Regained a bit following her 95-YO dad's birthday weekend.  She has lost inches, but remains focused on the number she sees on the scale.  [name] said she has started to feel like food is the enemy, starting to crave sweets.  She is also tried of eating the same things.    [name] said she "has to eat lunch out" on workdays b/c she will otherwise end up not getting out of the office at mid-day.     Recent physical activity includes 45-min personal trainer workouts 1 X wk and 60 min run/walk 4-5 X wk and shoulder rehab ex's (15 min) ~4 X wk.  She walks (leisurely) 20-60 min each workday for lunch.  (Rest day is Friday.)   No 24-hr recall today.   Progress Towards Goal(s):  In progress.   Nutritional Diagnosis:  NI-5.8.3 Inappropriate intake of types of carbohydrates (specify):   As related to blood glucose control.  As evidenced by usual breakfast of sweetened instant oatmeal.  In addition, greater veg intake would likely benefit BG control.     Intervention: Nutrition education  Handouts given during visit include:  AVS  Meal Planning form  Demonstrated degree of understanding via:  Teach Back  Barriers to learning/adherence to lifestyle change: Current diet lacking in variety and interesting foods.    Monitoring/Evaluation:  Dietary intake, exercise, and body weight in 8 week(s).

## 2017-01-30 DIAGNOSIS — R079 Chest pain, unspecified: Secondary | ICD-10-CM | POA: Diagnosis not present

## 2017-02-03 DIAGNOSIS — D696 Thrombocytopenia, unspecified: Secondary | ICD-10-CM | POA: Diagnosis not present

## 2017-02-03 DIAGNOSIS — E785 Hyperlipidemia, unspecified: Secondary | ICD-10-CM | POA: Diagnosis not present

## 2017-02-03 DIAGNOSIS — J4520 Mild intermittent asthma, uncomplicated: Secondary | ICD-10-CM | POA: Diagnosis not present

## 2017-02-06 ENCOUNTER — Other Ambulatory Visit: Payer: Self-pay | Admitting: Obstetrics and Gynecology

## 2017-02-06 DIAGNOSIS — Z1231 Encounter for screening mammogram for malignant neoplasm of breast: Secondary | ICD-10-CM

## 2017-03-07 ENCOUNTER — Ambulatory Visit (INDEPENDENT_AMBULATORY_CARE_PROVIDER_SITE_OTHER): Payer: 59 | Admitting: Family Medicine

## 2017-03-07 ENCOUNTER — Encounter: Payer: Self-pay | Admitting: Family Medicine

## 2017-03-07 DIAGNOSIS — R7309 Other abnormal glucose: Secondary | ICD-10-CM | POA: Diagnosis not present

## 2017-03-07 NOTE — Patient Instructions (Signed)
-   Your mom's mac&cheese:  When she makes it next, enjoy some!  Give yourself permission from time to time.  This will make your current eating behaviors sustainable.   - To reiterate from last appt:  Pay attention to food satisfaction.    - Studies suggest that meals are more satisfying when there are at least 3 distinct components.    - Vegetables (besides taste, texture, and color) can add volume to a meal. - Reminder: Obtain twice as many veg's as protein or carbohydrate foods for both lunch and dinner.  - Check out websites for meal planning: Search for Dr. Assunta Gambles and also for American Diabetes Association's Food Hub.    - Congratulations on the great choices you've been making in both diet and exercise!   (Note: If your weight goes down further, you may lose strength for your running!)

## 2017-03-07 NOTE — Progress Notes (Signed)
Medical Nutrition Therapy:  Appt start time: 1330 end time:  1430. PCP: Barnet Pall, MD   Assessment:  Primary concerns today: Weight management and Blood sugar control.   Learning Readiness: Change in progress Weight is down more than 6 lb since 01-10-17, at 122.6 lb, and has been stable for about the past 5 weeks.  [name] is still doing Weight Watchers, usually getting 23 points/day, up from about 19 points she was doing before.  She has started adding back a few carb foods, which has been more satisfying, and has not hampered weight loss or glucose control.   Still enjoying caramel macchiatos each Saturday following her long run.    Lab results on 9/28 were all excellent; glucose was 102 (down from 115).    Recent physical activity includes 45-min personal trainer workouts 1 X wk and 60 min run/walk 4 X wk with a longer run on Saturday.  No longer doing shoulder rehab ex's.  She walks (leisurely) 20-60 min 3-4 X wk for lunch.  (Rest days are Friday and Sunday.)  [name] is training for the [place] she hopes to run January 29.    24-hr recall:  (Up at 5:45 AM; took dog for a walk) B (7:30 AM)-  1 pkt inst flavored oatmeal, 1 c blkberries, 1 c coffee, 1 tsp sugar Snk ( AM)-  --- L (11:30 PM)-  1 [employer]k chx salad, 1 square Ghiradelli choc squares, unswt tea Snk (2:30)-  6 almonds (1 WW point), 1 apple Went running ~4:30 PM for ~2 1/2 mi.  D (7:30 PM)-  1 Healthy Choice cafe steamer bowl (~300 kcal), 1 c raspberries, 1/2 pear, 1 apple, water Snk ( PM)-  --- Typical day? Yes.    Progress Towards Goal(s):  In progress.   Nutritional Diagnosis:  Good progress noted on NI-5.8.3 Inappropriate intake of types of carbohydrates (specify):   As related to blood glucose control.  As evidenced by moderate portions of carb foods well spaced throughout the day.  In addition, greater veg intake would likely benefit BG control.     Intervention: Nutrition education  Handouts given during  visit include:  AVS  Demonstrated degree of understanding via:  Teach Back  Barriers to learning/adherence to lifestyle change: None currently apparent.    Monitoring/Evaluation:  Dietary intake, exercise, and body weight prn.

## 2017-03-29 MED FILL — ROSUVASTATIN CALCIUM 10 MG: 10 | 90 days supply | Qty: 90 | Fill #1

## 2017-05-04 ENCOUNTER — Ambulatory Visit
Admission: RE | Admit: 2017-05-04 | Discharge: 2017-05-04 | Disposition: A | Discharge status: Home / Self Care | Payer: 59 | Source: Ambulatory Visit | Attending: Obstetrics and Gynecology | Admitting: Obstetrics and Gynecology

## 2017-05-04 DIAGNOSIS — Z1231 Encounter for screening mammogram for malignant neoplasm of breast: Secondary | ICD-10-CM | POA: Diagnosis not present

## 2017-05-04 DIAGNOSIS — Z01419 Encounter for gynecological examination (general) (routine) without abnormal findings: Secondary | ICD-10-CM | POA: Diagnosis not present

## 2017-05-04 DIAGNOSIS — Z6824 Body mass index (BMI) 24.0-24.9, adult: Secondary | ICD-10-CM | POA: Diagnosis not present

## 2017-06-15 ENCOUNTER — Ambulatory Visit: Payer: 59 | Admitting: Sports Medicine

## 2017-06-15 ENCOUNTER — Encounter: Payer: Self-pay | Admitting: Sports Medicine

## 2017-06-15 DIAGNOSIS — M25562 Pain in left knee: Secondary | ICD-10-CM | POA: Diagnosis not present

## 2017-06-15 HISTORY — DX: Pain in left knee: M25.562

## 2017-06-15 NOTE — Progress Notes (Signed)
HPI  CC: Left knee pain Patient is here with reports of left-sided knee pain.  She states that she has been dealing with this pain over the past 3 weeks.  Pain is described as tight and sharp in nature.  Pain began after an attempt to readjust the positioning of her elderly father while caring for him.  She denies any feeling or audible "pop" but states that there was a specific moment when she felt as though there was something "not right".  She denies any knee swelling.  No erythema or ecchymosis.  No weakness, numbness, or paresthesias.  Pain seems to be worse with going up and down stairs.  She has also noticed worsening soreness at the beginning of her jogging routines as well as at the end of the day if she has exercised a significant amount.  Patient denies any mechanical catching or locking.  Medications/Interventions Tried: Relative rest Significant past medical history: Prior arthroscopic meniscectomies of the left knee; most recent surgery in 1991.  See HPI and/or previous note for associated ROS.  Objective: BP 123/64   Ht 5' (1.524 m)   Wt 122 lb (55.3 kg)   BMI 23.83 kg/m  Gen: NAD, well groomed, a/o x3, normal affect.  CV: Well-perfused. Warm.  Resp: Non-labored.  Neuro: Sensation intact throughout. No gross coordination deficits.  Gait: Nonpathologic posture, unremarkable stride without signs of limp or balance issues. Knee, Left: TTP noted at the medial and lateral joint lines and inferior pole of the patella. Inspection was negative for erythema, ecchymosis, and effusion. No obvious bony abnormalities or signs of osteophyte development. Palpation yielded no asymmetric warmth; No patellar crepitus. Patellar and quadriceps tendons unremarkable, and no tenderness of the pes anserine bursa. No obvious Baker's cyst development. ROM normal in flexion (135 degrees) and extension (0 degrees). Normal hamstring and quadriceps strength.  Hip abductor strength 5/5, but relatively weak  overall (Left worse than Right). Neurovascularly intact bilaterally.  - Ligaments: (Solid and consistent endpoints)   - ACL (present bilaterally)   - PCL (present bilaterally)   - LCL (present bilaterally)   - MCL (present bilaterally).   - Additional tests performed:    - Thessaly's: NEG  - Patella:   - Patellar grind/compression: NEG   - Patellar glide: Without apprehension  ULTRASOUND: Knee, Left Diagnostic complete ultrasound imaging obtained of patient's left knee.  - Quadriceps tendon: No appreciated signs of tearing, edema, or calcification. No (compressible) fluid/edema noted within the suprapatellar pouch.  - Patellar tendon: No appreciated signs of tearing, edema, or calcification. No infrapatellar or tibial tuberosity fluid or abnormality appreciated.  - Medial joint line: No signs concerning for meniscal pathology appreciated. No increased fluid presence noted. No evidence of osteophyte development or significant joint space loss.  - Lateral joint line: No signs concerning for meniscal pathology appreciated. Minimal increase in fluid presence noted. Very small osteophyte development noted. No significant joint space loss.  - Popliteal fossa: No evidence of Baker's cyst formation. Vasculature unremarkable. - MCL: No evidence of integrity loss or abnormal fluid presence.  - LCL: No evidence of integrity loss or abnormal fluid presence.  - Pes Anserine: No evidence of integrity loss or abnormal fluid presence.  IMPRESSION: findings consistent with anticipated joint space changes related to age and activity.  No obvious signs to explain patient's current discomfort.   Assessment and plan:  Knee pain, left Patient is here with acute left knee pain.  Signs and symptoms and physical exam consistent with  patellofemoral pain and possibly some degenerative inflammatory reactions.  Patient has a history of 2 prior surgical interventions for her meniscus on this knee.  No mechanical  symptoms at this time.  No effusion on ultrasound. -Encouraged regular rice therapy -OTC anti-inflammatories as needed -Compression sleeves often. -HEP >> focusing on quadricep and hip abduction strength -Follow-up as needed   Elberta Leatherwood, MD,MS [facility] Fellow 06/15/2017 6:23 PM

## 2017-06-15 NOTE — Assessment & Plan Note (Signed)
Patient is here with acute left knee pain.  Signs and symptoms and physical exam consistent with patellofemoral pain and possibly some degenerative inflammatory reactions.  Patient has a history of 2 prior surgical interventions for her meniscus on this knee.  No mechanical symptoms at this time.  No effusion on ultrasound. -Encouraged regular rice therapy -OTC anti-inflammatories as needed -Compression sleeves often. -HEP >> focusing on quadricep and hip abduction strength -Follow-up as needed

## 2017-06-20 ENCOUNTER — Ambulatory Visit: Payer: 59 | Admitting: Sports Medicine

## 2017-06-28 MED FILL — ROSUVASTATIN CALCIUM 10 MG: 10 | 90 days supply | Qty: 90 | Fill #2

## 2017-06-29 ENCOUNTER — Ambulatory Visit: Payer: 59 | Admitting: Sports Medicine

## 2017-06-29 DIAGNOSIS — L700 Acne vulgaris: Secondary | ICD-10-CM | POA: Diagnosis not present

## 2017-06-29 DIAGNOSIS — D225 Melanocytic nevi of trunk: Secondary | ICD-10-CM | POA: Diagnosis not present

## 2017-06-29 DIAGNOSIS — L821 Other seborrheic keratosis: Secondary | ICD-10-CM | POA: Diagnosis not present

## 2017-06-29 DIAGNOSIS — D1800 Hemangioma unspecified site: Secondary | ICD-10-CM | POA: Diagnosis not present

## 2017-06-29 DIAGNOSIS — D2271 Melanocytic nevi of right lower limb, including hip: Secondary | ICD-10-CM | POA: Diagnosis not present

## 2017-06-29 DIAGNOSIS — R229 Localized swelling, mass and lump, unspecified: Secondary | ICD-10-CM | POA: Diagnosis not present

## 2017-06-29 DIAGNOSIS — L814 Other melanin hyperpigmentation: Secondary | ICD-10-CM | POA: Diagnosis not present

## 2017-06-29 DIAGNOSIS — Z85828 Personal history of other malignant neoplasm of skin: Secondary | ICD-10-CM | POA: Diagnosis not present

## 2017-06-29 DIAGNOSIS — Z23 Encounter for immunization: Secondary | ICD-10-CM | POA: Diagnosis not present

## 2017-07-13 ENCOUNTER — Ambulatory Visit: Payer: 59 | Admitting: Sports Medicine

## 2017-07-13 ENCOUNTER — Encounter: Payer: Self-pay | Admitting: Sports Medicine

## 2017-07-13 DIAGNOSIS — M25562 Pain in left knee: Secondary | ICD-10-CM

## 2017-07-13 NOTE — Progress Notes (Signed)
   CC: F/U Left Knee Pain  HPI:   Patient states Left Knee Pain is overall improved, however, going up and down steps is still uncomfortable. She is able to climb up steps if she keeps foot completely flat. Has to walk sideways if going down steps because walking forward makes her knee pop, crack and stiffen. Has been completing all home exercises with her trainer except stool steps because of discomfort.   Does fitness training with trainer 2 x in a week and running daily without difficulty otherwise. When does have pain, it is now deep inside the knee along her previous surgery scar. Of note, pain was once localized to anterior inferior knee   Medications/Interventions Tried: daily HEP exercises  See HPI and/or previous note for associated ROS. No knee swelling No mechanical sxs  Objective: BP 140/78   Ht 5' (1.524 m)   Wt 125 lb (56.7 kg)   BMI 24.41 kg/m  Gen:  NAD, well groomed, a/o x3, normal affect.  CV: RRR, no m/g/r, extremities Well-perfused. Warm.  Resp: Non-labored.  Neuro: Sensation intact throughout. No gross coordination deficits.  Gait: Nonpathologic posture, unremarkable stride without signs of limp or balance issues. MSK: Full ROM at left knee, Not TTP, No erythema or joint effusion, Crepitis of b/l knees though L>R, 5/5 strength at L knee joint when in flexion, 4/5 strength at L knee joint when fully extended, 5/5 strength at L hip flexion and abduction  Assessment and plan:  Knee pain, left Patient is here for f/u on left knee pain.  Signs and symptoms and physical exam today demonstrate improvement in what was likely patellofemoral pain syndrome and degenerative disease at the joint. Because of persistent pain and discomfort with certain movements however, she does require activity modification and continued quad and hip strengthening exercises to improve stress on her knee   Recommend the following: - Lateral leg lifts instead of climbing up and down steps.    - As more comfortable, introduce one leg Maryclare Bean, MD slides to routine.  - Continue all other home exercise routine as usual.  - Follow-up as needed if not improved after 6 weeks   Magda Kiel, MD/MPH [facility], PGY-1 07/13/2017 7:52 PM  I observed and examined the patient with the resident and agree with assessment and plan.  Note reviewed and modified by me.  Stefanie Libel, MD

## 2017-07-13 NOTE — Assessment & Plan Note (Signed)
Clearly improved with HEP  Also working with trainer and very helpful  We will modify and add some 1 leg exercise  If not better at 6 wks reck

## 2017-07-13 NOTE — Patient Instructions (Signed)
It may be difficult for you to climb steps now. However:  - Please do lateral leg lifts instead of climbing up and down steps.  - You should continue your exercise routine as usual.  - As you get more comfortable, gradually do one leg dips as well as one leg wall slides.   If you don't feel like this improves your symptoms in 6 weeks, return to the clinic.

## 2017-08-02 DIAGNOSIS — J4520 Mild intermittent asthma, uncomplicated: Secondary | ICD-10-CM | POA: Diagnosis not present

## 2017-08-02 DIAGNOSIS — D696 Thrombocytopenia, unspecified: Secondary | ICD-10-CM | POA: Diagnosis not present

## 2017-08-02 DIAGNOSIS — Z0000 Encounter for general adult medical examination without abnormal findings: Secondary | ICD-10-CM | POA: Diagnosis not present

## 2017-08-02 DIAGNOSIS — E785 Hyperlipidemia, unspecified: Secondary | ICD-10-CM | POA: Diagnosis not present

## 2017-08-07 MED FILL — VENTOLIN HFA 90 MCG INHALER: 108 (90 BAS | 25 days supply | Qty: 18 | Fill #0

## 2017-08-10 ENCOUNTER — Ambulatory Visit: Payer: 59 | Admitting: Sports Medicine

## 2017-09-08 ENCOUNTER — Ambulatory Visit: Payer: 59 | Admitting: Emergency Medicine

## 2017-09-08 ENCOUNTER — Institutional Professional Consult (permissible substitution): Payer: 59 | Admitting: Emergency Medicine

## 2017-09-08 ENCOUNTER — Encounter: Payer: Self-pay | Admitting: Emergency Medicine

## 2017-09-08 VITALS — BP 122/80 | HR 70 | Ht 60.0 in | Wt 128.0 lb

## 2017-09-08 DIAGNOSIS — J45909 Unspecified asthma, uncomplicated: Secondary | ICD-10-CM

## 2017-09-08 NOTE — Patient Instructions (Addendum)
We will perform pulmonary function testing.  We will try starting loratadine 10mg  daily until next visit Please keep your albuterol available to use 2 puffs as needed for shortness of breath or chest tightness.  Try to avoid throat clearing if possible  Follow with Dr Lamonte Sakai next available after the PFT to review together.

## 2017-09-08 NOTE — Progress Notes (Signed)
Subjective:    Patient ID: Rhonda [name], female    DOB: Nov 24, 1950, 67 y.o.   MRN: [id number]  HPI 67 year old never smoker with a history of hyperlipidemia.  She carries a history of asthma that was made by her PCP recently. She has been experiencing chest tightness with exercise / running, then some cough, has been problematic over the last year. Has also noticed some episodes of dry cough, feels like UA irritation, can be associated with some voice. She uses albuterol occasionally > helps some, but still some tightness present   Review of Systems  Constitutional: Negative for fever and unexpected weight change.  HENT: Negative for congestion, dental problem, ear pain, nosebleeds, postnasal drip, rhinorrhea, sinus pressure, sneezing, sore throat and trouble swallowing.   Eyes: Negative for redness and itching.  Respiratory: Positive for chest tightness and shortness of breath. Negative for cough and wheezing.   Cardiovascular: Negative for palpitations and leg swelling.  Gastrointestinal: Negative for nausea and vomiting.  Genitourinary: Negative for dysuria.  Musculoskeletal: Negative for joint swelling.  Skin: Negative for rash.  Neurological: Negative for headaches.  Hematological: Does not bruise/bleed easily.  Psychiatric/Behavioral: Negative for dysphoric mood. The patient is not nervous/anxious.    Past Medical History:  Diagnosis Date  . High cholesterol      Family History  Problem Relation Age of Onset  . COPD Mother   . Pulmonary fibrosis Father      Social History   Socioeconomic History  . Marital status: Married    Spouse name: Not on file  . Number of children: Not on file  . Years of education: Not on file  . Highest education level: Not on file  Occupational History  . Not on file  Social Needs  . Financial resource strain: Not on file  . Food insecurity:    Worry: Not on file    Inability: Not on file  . Transportation needs:    Medical: Not  on file    Non-medical: Not on file  Tobacco Use  . Smoking status: Never Smoker  . Smokeless tobacco: Never Used  Substance and Sexual Activity  . Alcohol use: Yes  . Drug use: No  . Sexual activity: Not on file  Lifestyle  . Physical activity:    Days per week: Not on file    Minutes per session: Not on file  . Stress: Not on file  Relationships  . Social connections:    Talks on phone: Not on file    Gets together: Not on file    Attends religious service: Not on file    Active member of club or organization: Not on file    Attends meetings of clubs or organizations: Not on file    Relationship status: Not on file  . Intimate partner violence:    Fear of current or ex partner: Not on file    Emotionally abused: Not on file    Physically abused: Not on file    Forced sexual activity: Not on file  Other Topics Concern  . Not on file  Social History Narrative  . Not on file  has always worked in Engineer, technical sales  No [employer]  Has lived in [place], [place], [place],   Allergies  Allergen Reactions  . Acetaminophen     REACTION: mild throat swelling if takes more than a few consecutive doses  . Aspirin     REACTION: anaphylaxis  . Nsaids     REACTION: face \\T \throat swelling,  sneezing and difficulty breathing  . Pravastatin      Outpatient Medications Prior to Visit  Medication Sig Dispense Refill  . albuterol (PROVENTIL HFA) 108 (90 BASE) MCG/ACT inhaler Inhale 2 puffs into the lungs every 6 (six) hours as needed for wheezing.    . rosuvastatin (CRESTOR) 10 MG tablet Take 10 mg by mouth.     No facility-administered medications prior to visit.         Objective:   Physical Exam Vitals:   09/08/17 0927 09/08/17 0931  BP:  122/80  Pulse:  70  SpO2:  100%  Weight: 128 lb (58.1 kg)   Height: 5' (1.524 m)    Gen: Pleasant, well-nourished, in no distress,  normal affect  ENT: No lesions,  mouth clear,  oropharynx clear, no postnasal drip  Neck: No JVD, no TMG, no carotid  bruits  Lungs: No use of accessory muscles, no dullness to percussion, clear without rales or rhonchi  Cardiovascular: RRR, heart sounds normal, no murmur or gallops, no peripheral edema  Musculoskeletal: No deformities, no cyanosis or clubbing  Neuro: alert, non focal  Skin: Warm, no lesions or rash      Assessment & Plan:  No problem-specific Assessment & Plan notes found for this encounter.  Baltazar Apo, MD, PhD 09/08/2017, 9:44 AM [facility] [phone] or if no answer [phone]

## 2017-09-25 MED FILL — ROSUVASTATIN CALCIUM 10 MG: 10 | 90 days supply | Qty: 90 | Fill #3

## 2017-09-27 ENCOUNTER — Encounter: Payer: Self-pay | Admitting: Gastroenterology

## 2017-10-03 ENCOUNTER — Ambulatory Visit: Payer: 59 | Admitting: Emergency Medicine

## 2017-10-03 ENCOUNTER — Encounter: Payer: Self-pay | Admitting: Emergency Medicine

## 2017-10-03 ENCOUNTER — Ambulatory Visit (INDEPENDENT_AMBULATORY_CARE_PROVIDER_SITE_OTHER): Payer: 59 | Admitting: Emergency Medicine

## 2017-10-03 DIAGNOSIS — J4520 Mild intermittent asthma, uncomplicated: Secondary | ICD-10-CM

## 2017-10-03 DIAGNOSIS — J45909 Unspecified asthma, uncomplicated: Secondary | ICD-10-CM | POA: Diagnosis not present

## 2017-10-03 LAB — PULMONARY FUNCTION TEST
DL/VA % pred: 119 %
DL/VA: 5.05 ml/min/mmHg/L
DLCO UNC % PRED: 105 %
DLCO unc: 19.96 ml/min/mmHg
FEF 25-75 PRE: 1.81 L/s
FEF 25-75 Post: 2.79 L/sec
FEF2575-%CHANGE-POST: 53 %
FEF2575-%PRED-PRE: 100 %
FEF2575-%Pred-Post: 154 %
FEV1-%CHANGE-POST: 15 %
FEV1-%Pred-Post: 110 %
FEV1-%Pred-Pre: 95 %
FEV1-PRE: 1.91 L
FEV1-Post: 2.2 L
FEV1FVC-%CHANGE-POST: 3 %
FEV1FVC-%Pred-Pre: 103 %
FEV6-%Change-Post: 11 %
FEV6-%Pred-Post: 106 %
FEV6-%Pred-Pre: 95 %
FEV6-PRE: 2.38 L
FEV6-Post: 2.66 L
FEV6FVC-%PRED-PRE: 105 %
FEV6FVC-%Pred-Post: 105 %
FVC-%Change-Post: 11 %
FVC-%PRED-POST: 101 %
FVC-%Pred-Pre: 91 %
FVC-Post: 2.66 L
FVC-Pre: 2.39 L
PRE FEV6/FVC RATIO: 100 %
Post FEV1/FVC ratio: 83 %
Post FEV6/FVC ratio: 100 %
Pre FEV1/FVC ratio: 80 %
RV % pred: 98 %
RV: 1.9 L
TLC % pred: 98 %
TLC: 4.4 L

## 2017-10-03 NOTE — Assessment & Plan Note (Signed)
Confirmed on PFT today based on BD responsiveness. Her sx are primarily exercise related - better since she started pre-treating w albuterol.   Your pulmonary function testing shows evidence for mild asthma.  Please continue to use your albuterol, 2 puffs prior to your exercise routine. You may also use this if needed for any episode of shortness of breath, wheezing, chest tightness.  Let us know if you notice that your symptoms or albuterol use are more frequent.  Follow with Dr Lamonte Sakai in 6 months or sooner if you have any problems

## 2017-10-03 NOTE — Progress Notes (Signed)
PFT done today. 

## 2017-10-03 NOTE — Patient Instructions (Signed)
Your pulmonary function testing shows evidence for mild asthma.  Please continue to use your albuterol, 2 puffs prior to your exercise routine. You may also use this if needed for any episode of shortness of breath, wheezing, chest tightness.  Let us know if you notice that your symptoms or albuterol use are more frequent.  Follow with Dr Lamonte Sakai in 6 months or sooner if you have any problems

## 2017-10-03 NOTE — Progress Notes (Signed)
Subjective:    Patient ID: Rhonda [name], female    DOB: March 21, 1951, 67 y.o.   MRN: [id number]  Asthma  She complains of shortness of breath. There is no cough or wheezing. Pertinent negatives include no ear pain, fever, headaches, postnasal drip, rhinorrhea, sneezing, sore throat or trouble swallowing. Her past medical history is significant for asthma.   67 year old never smoker with a history of hyperlipidemia.  She carries a history of asthma that was made by her PCP recently. She has been experiencing chest tightness with exercise / running, then some cough, has been problematic over the last year. Has also noticed some episodes of dry cough, feels like UA irritation, can be associated with some voice. She uses albuterol occasionally > helps some, but still some tightness present  ROV 10/03/17 --this is a follow-up visit for evaluation of upper airway irritation, cough, chest tightness with some associated shortness of breath.  It happens most often with exercise.  We tried starting loratadine to see if there was an impact on allergic contribution. Also started pre-treating her runs with albuterol > has helped her chest pressure. She underwent pulmonary function testing today that I have reviewed.  This shows spirometry with normal airflows but with a positive bronchodilator response consistent with obstruction.  Her lung volumes were normal and her diffusion capacity is normal.   Review of Systems  Constitutional: Negative for fever and unexpected weight change.  HENT: Negative for congestion, dental problem, ear pain, nosebleeds, postnasal drip, rhinorrhea, sinus pressure, sneezing, sore throat and trouble swallowing.   Eyes: Negative for redness and itching.  Respiratory: Positive for chest tightness and shortness of breath. Negative for cough and wheezing.   Cardiovascular: Negative for palpitations and leg swelling.  Gastrointestinal: Negative for nausea and vomiting.  Genitourinary:  Negative for dysuria.  Musculoskeletal: Negative for joint swelling.  Skin: Negative for rash.  Neurological: Negative for headaches.  Hematological: Does not bruise/bleed easily.  Psychiatric/Behavioral: Negative for dysphoric mood. The patient is not nervous/anxious.    Past Medical History:  Diagnosis Date  . High cholesterol      Family History  Problem Relation Age of Onset  . COPD Mother   . Pulmonary fibrosis Father      Social History   Socioeconomic History  . Marital status: Married    Spouse name: Not on file  . Number of children: Not on file  . Years of education: Not on file  . Highest education level: Not on file  Occupational History  . Not on file  Social Needs  . Financial resource strain: Not on file  . Food insecurity:    Worry: Not on file    Inability: Not on file  . Transportation needs:    Medical: Not on file    Non-medical: Not on file  Tobacco Use  . Smoking status: Never Smoker  . Smokeless tobacco: Never Used  Substance and Sexual Activity  . Alcohol use: Yes  . Drug use: No  . Sexual activity: Not on file  Lifestyle  . Physical activity:    Days per week: Not on file    Minutes per session: Not on file  . Stress: Not on file  Relationships  . Social connections:    Talks on phone: Not on file    Gets together: Not on file    Attends religious service: Not on file    Active member of club or organization: Not on file    Attends meetings  of clubs or organizations: Not on file    Relationship status: Not on file  . Intimate partner violence:    Fear of current or ex partner: Not on file    Emotionally abused: Not on file    Physically abused: Not on file    Forced sexual activity: Not on file  Other Topics Concern  . Not on file  Social History Narrative  . Not on file  has always worked in Engineer, technical sales  No [employer]  Has lived in [place], [place], [place],   Allergies  Allergen Reactions  . Acetaminophen     REACTION: mild throat swelling if  takes more than a few consecutive doses  . Aspirin     REACTION: anaphylaxis  . Nsaids     REACTION: face \\T \throat swelling, sneezing and difficulty breathing  . Pravastatin      Outpatient Medications Prior to Visit  Medication Sig Dispense Refill  . albuterol (PROVENTIL HFA) 108 (90 BASE) MCG/ACT inhaler Inhale 2 puffs into the lungs every 6 (six) hours as needed for wheezing.    . rosuvastatin (CRESTOR) 10 MG tablet Take 10 mg by mouth.     No facility-administered medications prior to visit.         Objective:   Physical Exam Vitals:   10/03/17 1502 10/03/17 1515  BP:  124/74  Pulse:  61  SpO2:  99%  Weight: 135 lb (61.2 kg)   Height: 5' (1.524 m)    Gen: Pleasant, well-nourished, in no distress,  normal affect  ENT: No lesions,  mouth clear,  oropharynx clear, no postnasal drip  Neck: No JVD, no stridor  Lungs: No use of accessory muscles, no dullness to percussion, clear without rales or rhonchi  Cardiovascular: RRR, heart sounds normal, no murmur or gallops, no peripheral edema  Musculoskeletal: No deformities, no cyanosis or clubbing  Neuro: alert, non focal  Skin: Warm, no lesions or rash      Assessment & Plan:  Asthma Confirmed on PFT today based on BD responsiveness. Her sx are primarily exercise related - better since she started pre-treating w albuterol.   Your pulmonary function testing shows evidence for mild asthma.  Please continue to use your albuterol, 2 puffs prior to your exercise routine. You may also use this if needed for any episode of shortness of breath, wheezing, chest tightness.  Let us know if you notice that your symptoms or albuterol use are more frequent.  Follow with Dr Lamonte Sakai in 6 months or sooner if you have any problems  Baltazar Apo, MD, PhD 10/03/2017, 3:34 PM [facility] [phone] or if no answer [phone]

## 2017-11-10 ENCOUNTER — Emergency Department (HOSPITAL_BASED_OUTPATIENT_CLINIC_OR_DEPARTMENT_OTHER)
Admission: EM | Admit: 2017-11-10 | Discharge: 2017-11-10 | Disposition: A | Discharge status: Home / Self Care | Payer: 59 | Attending: Emergency Medicine | Admitting: Emergency Medicine

## 2017-11-10 ENCOUNTER — Encounter (HOSPITAL_BASED_OUTPATIENT_CLINIC_OR_DEPARTMENT_OTHER): Payer: Self-pay | Admitting: Emergency Medicine

## 2017-11-10 ENCOUNTER — Other Ambulatory Visit: Payer: Self-pay

## 2017-11-10 DIAGNOSIS — Y998 Other external cause status: Secondary | ICD-10-CM | POA: Insufficient documentation

## 2017-11-10 DIAGNOSIS — Z23 Encounter for immunization: Secondary | ICD-10-CM | POA: Diagnosis not present

## 2017-11-10 DIAGNOSIS — W450XXA Nail entering through skin, initial encounter: Secondary | ICD-10-CM | POA: Insufficient documentation

## 2017-11-10 DIAGNOSIS — S91332A Puncture wound without foreign body, left foot, initial encounter: Secondary | ICD-10-CM | POA: Diagnosis not present

## 2017-11-10 DIAGNOSIS — Z79899 Other long term (current) drug therapy: Secondary | ICD-10-CM | POA: Diagnosis not present

## 2017-11-10 DIAGNOSIS — Y92007 Garden or yard of unspecified non-institutional (private) residence as the place of occurrence of the external cause: Secondary | ICD-10-CM | POA: Insufficient documentation

## 2017-11-10 DIAGNOSIS — Y939 Activity, unspecified: Secondary | ICD-10-CM | POA: Insufficient documentation

## 2017-11-10 DIAGNOSIS — E7800 Pure hypercholesterolemia, unspecified: Secondary | ICD-10-CM | POA: Diagnosis not present

## 2017-11-10 DIAGNOSIS — T148XXA Other injury of unspecified body region, initial encounter: Secondary | ICD-10-CM

## 2017-11-10 MED ORDER — TETANUS-DIPHTH-ACELL PERTUSSIS 5-2.5-18.5 LF-MCG/0.5 IM SUSP
0.5000 mL | Freq: Once | INTRAMUSCULAR | Status: AC
Start: 2017-11-10 — End: 2017-11-10
  Administered 2017-11-10: 0.5 mL via INTRAMUSCULAR
  Filled 2017-11-10: qty 0.5

## 2017-11-10 NOTE — ED Provider Notes (Signed)
[facility] DEPARTMENT Provider Note   CSN: [id number] Arrival date & time: 11/10/17  0910     History   Chief Complaint Chief Complaint  Patient presents with  . Puncture Wound    HPI Rhonda [name] is a 67 y.o. female.  Patient is a 67 year old relatively healthy female with hyperlipidemia presenting today after she stepped on his roofing nail that was outside in her yard.  She was wearing flip-flops and it punctured through the flip-flops.  It never bled and it was not a deep wound and she has no pain at this time.  However she is sure that her tetanus shot is out of date and its been at least 6 years and is here requesting a tetanus shot.  The history is provided by the patient.    Past Medical History:  Diagnosis Date  . High cholesterol     Patient Active Problem List   Diagnosis Date Noted  . Knee pain, left 06/15/2017  . Rotator cuff tendinitis, left 09/08/2016  . Right foot pain 04/06/2016  . Injury of plantaris muscle or tendon 10/30/2014  . Achilles tendinitis of right lower extremity 06/19/2014  . Right knee pain 04/23/2013  . Patellar tendinitis 03/07/2013  . Muscle tear 11/15/2012  . Metatarsal stress fracture 08/29/2012  . Numbness and tingling of foot 03/29/2012  . Ankle pain 08/17/2011  . Chest pain 08/17/2011  . TROCHANTERIC BURSITIS, LEFT 04/27/2010  . LEG PAIN, RIGHT 09/22/2009  . TIBIALIS TENDINITIS 08/12/2009  . STRESS FRACTURE, TIBIA 06/15/2009  . UNEQUAL LEG LENGTH 09/18/2008  . CARCINOMA, BASAL CELL 02/07/2008  . MALIGNANT NEOPLASM OF BREAST UNSPECIFIED SITE 02/07/2008  . STRESS FRACTURE OF PELVIS 08/29/2007  . HAMMER TOE, OTHER, ACQUIRED 10/13/2006  . ALLERGIC RHINITIS 10/12/2006  . Asthma 10/12/2006    Past Surgical History:  Procedure Laterality Date  . ABDOMINAL SURGERY    . ABDOMINAL SURGERY     for removal of abdominal mass-cancerous  . KNEE SURGERY    . MANDIBLE FRACTURE SURGERY    . NASAL SINUS  SURGERY    . TONSILLECTOMY       OB History   None      Home Medications    Prior to Admission medications   Medication Sig Start Date End Date Taking? Authorizing Provider  albuterol (PROVENTIL HFA) 108 (90 BASE) MCG/ACT inhaler Inhale 2 puffs into the lungs every 6 (six) hours as needed for wheezing.    [provider]  rosuvastatin (CRESTOR) 10 MG tablet Take 10 mg by mouth. 01/30/14   [provider]    Family History Family History  Problem Relation Age of Onset  . COPD Mother   . Pulmonary fibrosis Father     Social History Social History   Tobacco Use  . Smoking status: Never Smoker  . Smokeless tobacco: Never Used  Substance Use Topics  . Alcohol use: Yes  . Drug use: No     Allergies   Acetaminophen; Aspirin; Nsaids; and Pravastatin   Review of Systems Review of Systems  All other systems reviewed and are negative.    Physical Exam Updated Vital Signs BP (!) 155/71 (BP Location: Left Arm)   Pulse 70   Temp 98.2 F (36.8 C) (Oral)   Resp 18   Ht 5' (1.524 m)   Wt 58.1 kg (128 lb)   SpO2 100%   BMI 25.00 kg/m   Physical Exam  Constitutional: She is oriented to person, place, and time.  She appears well-developed and well-nourished. No distress.  HENT:  Head: Normocephalic and atraumatic.  Eyes: Pupils are equal, round, and reactive to light.  Cardiovascular: Normal rate.  Pulmonary/Chest: Effort normal.  Musculoskeletal: Normal range of motion. She exhibits no edema or tenderness.       Feet:  Neurological: She is alert and oriented to person, place, and time.  Skin: Skin is warm and dry. No rash noted. No erythema.  Psychiatric: She has a normal mood and affect. Her behavior is normal.  Nursing note and vitals reviewed.    ED Treatments / Results  Labs (all labs ordered are listed, but only abnormal results are displayed) Labs Reviewed - No data to display  EKG None  Radiology No results  found.  Procedures Procedures (including critical care time)  Medications Ordered in ED Medications  Tdap (BOOSTRIX) injection 0.5 mL (has no administration in time range)     Initial Impression / Assessment and Plan / ED Course  I have reviewed the triage vital signs and the nursing notes.  Pertinent labs & imaging results that were available during my care of the patient were reviewed by me and considered in my medical decision making (see chart for details).     Patient here after stepping on a nail.  The area never bled and on sure if there was even breakage of the skin.  Will update patient's tetanus shot but do not feel that she needs antibiotics at this time.  Final Clinical Impressions(s) / ED Diagnoses   Final diagnoses:  Puncture wound    ED Discharge Orders    None       Blanchie Dessert, MD 11/10/17 [phone]

## 2017-11-10 NOTE — ED Triage Notes (Signed)
Reports stepped on rusty nail this morning.  Small puncture wound to bottom of left foot.  No active bleeding. Presents to ER for possibly needing tetanus vaccine.

## 2017-11-23 DIAGNOSIS — H5212 Myopia, left eye: Secondary | ICD-10-CM | POA: Diagnosis not present

## 2017-11-23 DIAGNOSIS — H52221 Regular astigmatism, right eye: Secondary | ICD-10-CM | POA: Diagnosis not present

## 2017-11-23 DIAGNOSIS — H524 Presbyopia: Secondary | ICD-10-CM | POA: Diagnosis not present

## 2017-11-23 DIAGNOSIS — H52202 Unspecified astigmatism, left eye: Secondary | ICD-10-CM | POA: Diagnosis not present

## 2017-11-23 DIAGNOSIS — H2513 Age-related nuclear cataract, bilateral: Secondary | ICD-10-CM | POA: Diagnosis not present

## 2017-12-04 DIAGNOSIS — N9089 Other specified noninflammatory disorders of vulva and perineum: Secondary | ICD-10-CM | POA: Diagnosis not present

## 2017-12-04 MED FILL — CLOBETASOL PROPIONATE 0.05: 0.05 | 15 days supply | Qty: 30 | Fill #0

## 2017-12-19 DIAGNOSIS — D280 Benign neoplasm of vulva: Secondary | ICD-10-CM | POA: Diagnosis not present

## 2017-12-19 DIAGNOSIS — N9089 Other specified noninflammatory disorders of vulva and perineum: Secondary | ICD-10-CM | POA: Diagnosis not present

## 2017-12-25 MED FILL — ROSUVASTATIN CALCIUM 10 MG: 10 | 90 days supply | Qty: 90 | Fill #0

## 2018-01-02 ENCOUNTER — Ambulatory Visit: Payer: 59 | Admitting: Sports Medicine

## 2018-01-02 ENCOUNTER — Encounter: Payer: Self-pay | Admitting: Sports Medicine

## 2018-01-02 VITALS — BP 108/62 | Ht 60.0 in | Wt 125.0 lb

## 2018-01-02 DIAGNOSIS — M7670 Peroneal tendinitis, unspecified leg: Secondary | ICD-10-CM

## 2018-01-02 NOTE — Progress Notes (Signed)
   Subjective:    Patient ID: Rhonda [name], female    DOB: 1950-07-19, 67 y.o.   MRN: [id number]  HPI 67yoF with pmh of multiple stress fractures and osteopenia presents for evaluation of lateral right foot pain. Onset was about 6 weeks ago. Patient is an avid runner and is currently training for three Marathons. She is currently running 61mi/wk. Patient reports that discomfort has progressed since onset.  Originally, pain and discomfort was 1/10 after runs. Now, pain  has escalated to moderate-severe pain after runs and with palpation of area.  She does not recall if she has experienced swelling w/pain. She denied any trauma, skin changes, weakness or decreased ROM of right foot.  She is worried about this pain as it is mimicking pain from her previous stress fractures.  Of note, patient does report wearing different brand of shoes in the past few weeks.  She also states it is  time to retire her current running shoes.  Patient has not taken medication for pain as she is allergic to ASA and APAP-- both oral and topical versions.     Review of Systems As per above I have reviewed patient's pmh, medications, and DEXA scan on file.     Objective:   Physical Exam G: Alert, cooperative, pleasant Pulm: no conversational dyspnea MSK:   RIGHT FOOT: patient is tender to palpation at the base of the fifth metatarsal specifically at the insertion of the peroneal brevis tendon. There is no soft tissue swelling here. No other tenderness to palpation along the fifth metatarsal. Minimal pain with foot eversion but she does have reproduced pain with passive inversion which stretches the peroneal brevis tendon. Good pulses. Neurovascular intact distally. Walking with a slight limp.  NEURO: Gait is normal.   Ultrasound of Right fifth metatarsal base:  Spur was noted along fifth MT base. There is some hypoechoic change and some fluid at the insertion of the peroneal brevis onto the base of the fifth.I do not  see any obvious stress fracture.     Assessment & Plan:  Right foot pain due to Peroneal tendinitis  Due to swelling seen on [place], I advised patient that it is important to ice affected area. We discussed appropriate icing technique. I also advise patient wear an arch compression sleeve. Patient has one at home already.  Advised to use this compression sleeve while running.  I strongly counseled patient on backing off on training; however, patient is very hesitant to ease up on her training at this time.  We discussed importance of resting affected area. I advised she use pain as her guide in regards to her training. We also considered using an insert (green sports insole with a fifth ray post), however, patient would like to wait until she gets her new shoes before using insert. We've also given her some eccentric exercises to start doing daily. We agree that patient will call and let me know if she wishes to pick up a compression sleeve or insert after she has purchased her new shoes.  Follow up as needed.

## 2018-01-03 ENCOUNTER — Encounter: Payer: Self-pay | Admitting: Sports Medicine

## 2018-01-09 DIAGNOSIS — E785 Hyperlipidemia, unspecified: Secondary | ICD-10-CM | POA: Diagnosis not present

## 2018-01-22 DIAGNOSIS — E785 Hyperlipidemia, unspecified: Secondary | ICD-10-CM | POA: Diagnosis not present

## 2018-01-22 DIAGNOSIS — I440 Atrioventricular block, first degree: Secondary | ICD-10-CM | POA: Diagnosis not present

## 2018-01-22 DIAGNOSIS — R0789 Other chest pain: Secondary | ICD-10-CM | POA: Diagnosis not present

## 2018-01-22 MED FILL — OMEPRAZOLE 40 MG CPDR: 40 | 30 days supply | Qty: 30 | Fill #0

## 2018-01-29 MED FILL — OMEPRAZOLE 20 MG CPDR: 20 | 90 days supply | Qty: 180 | Fill #0

## 2018-03-06 ENCOUNTER — Other Ambulatory Visit: Payer: Self-pay | Admitting: Obstetrics and Gynecology

## 2018-03-06 DIAGNOSIS — Z1231 Encounter for screening mammogram for malignant neoplasm of breast: Secondary | ICD-10-CM

## 2018-03-06 NOTE — Progress Notes (Signed)
Cardiology Office Note:    Date:  03/07/2018   ID:  Rhonda [name], DOB 03-21-1951, MRN [id number]  PCP:  Stefanie Libel, MD  Cardiologist:  Shirlee More, MD   Referring MD: Stefanie Libel, MD  ASSESSMENT:    1. Chest pain in adult   2. Hyperlipidemia LDL goal <130   3. Mild intermittent asthma without complication    PLAN:    In order of problems listed above:  1. Further evaluation stress echo over CTA at the patient's request if normal follow-up PRN if abnormal bring back to the office and especially if she has high risk markers consider coronary angiography. 2. Stable continue her statin 3. Stable continue bronchodilator prior to exercise clinically I do not think that her problem here is exercise-induced asthma causing anginal symptoms  Next appointment 4 weeks   Medication Adjustments/Labs and Tests Ordered: Current medicines are reviewed at length with the patient today.  Concerns regarding medicines are outlined above.  Orders Placed This Encounter  Procedures  . EKG 12-Lead  . ECHOCARDIOGRAM STRESS TEST   No orders of the defined types were placed in this encounter.    Chief Complaint  Patient presents with  . Chest Pain    History of Present Illness:    Rhonda [name] is a 67 y.o. female who is being seen today for the evaluation of chest pain at the request of Dr. 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  Unfortunately her father became critically ill and entered hospice about 6 to 10 weeks ago and she had a marked change in her physical health she developed exercise intolerance and she had trouble when she would start to run in the first mile she be short of breath pressure in her chest but she was able to run through it since that time this is been a predictable recurrent problem and she is unsure whether it is related to reflux  exercise-induced asthma that she uses an inhaler to treat her whether she developed heart disease.  Her EKG shows variant poor R wave progression first-degree AV block her symptoms are not unstable reviewed the options of traditional stress test modalities versus cardiac CTA and she opts for a stress echo to get an early result and wishes to avoid radiation exposure if possible as well as IV needles in contrast.  Should be set up for stress echo first opportunity if abnormal require further evaluation of high risk markers would benefit from coronary angiography.  Presently she is not wheezing no cough chest pain does not occur at rest or nocturnal described as pressure substernal no radiation moderate to severe with activity it is able to run through it.   Past Medical History:  Diagnosis Date  . Achilles tendinitis of right lower extremity 06/19/2014   US showed minor changes in December 15  Most sxs seem to be enthesopathy  Note had reaction to NTG in past  Overview:  Overview:  US showed minor changes in December 15  Most sxs seem to be enthesopathy  Note had reaction to NTG in past  Last Assessment & Plan:  Recent irritation in the race appears to be a partial plantaris tear  This continues to give her some Achilles symptoms  I think a period  . ALLERGIC RHINITIS 10/12/2006   Qualifier: Diagnosis of  By: McDiarmid MD, Sherren Mocha    .  Ankle pain 08/17/2011  . Asthma 10/12/2006   Qualifier: Diagnosis of  By: McDiarmid MD, Sherren Mocha    Overview:  Overview:  Qualifier: Diagnosis of  By: McDiarmid MD, Sherren Mocha   . CARCINOMA, BASAL CELL 02/07/2008   Qualifier: History of  By: Drue Flirt  MD, Merrily Brittle    Overview:  Overview:  Qualifier: History of  By: Drue Flirt  MD, Merrily Brittle   Overview:  Qualifier: History of  By: Drue Flirt  MD, Merrily Brittle   . Chest pain in adult 08/17/2011   This has worrisome features particularly with her level of exertion   . HAMMER TOE, OTHER, ACQUIRED 10/13/2006   Qualifier: Diagnosis of  By: McDiarmid MD, Sherren Mocha     Overview:  Overview:  Qualifier: Diagnosis of  By: McDiarmid MD, Sherren Mocha   . High cholesterol   . Hyperlipidemia LDL goal <130 10/24/2013  . Injury of plantaris muscle or tendon 10/30/2014  . Insomnia 11/13/2013  . Knee pain, left 06/15/2017  . LEG PAIN, RIGHT 09/22/2009   Qualifier: Diagnosis of  By: Oneida Alar MD, KARL    . Malignant neoplasm of female breast ([place]) 02/07/2008   Qualifier: History of  By: Drue Flirt  MD, Merrily Brittle    Overview:  Overview:  Qualifier: History of  By: Drue Flirt  MD, Merrily Brittle  Overview:  Overview:  Qualifier: History of  By: Drue Flirt  MD, Merrily Brittle   . Metatarsal stress fracture 08/29/2012   This is at the distal shaft of the fifth metatarsal left foot   . Mild intermittent asthma without complication 1/75/1025  . Muscle tear 11/15/2012   Left rectus femoris; also like tendon tear   . Numbness and tingling of foot 03/29/2012  . Patellar tendinitis 03/07/2013   I suspect this occurred because she did less crosstraining before this marathon and was probably hamstring dominant   . Right foot pain 04/06/2016  . Right knee pain 04/23/2013   04/23/13  ultrasound today RT knee  revealed no significant effusion The quadriceps tendon is intact as is the patellar tendon The medial meniscus was normal The lateral meniscus had an area of calcification and a very small area of splitting along the midline to the posterior third of the joint line   . Rotator cuff tendinitis, left 09/08/2016   Overview:  Last Assessment & Plan:  Significant improvement  See OV summary  . Sensorineural hearing loss 11/13/2013  . Stress fracture of pelvis 08/29/2007   Qualifier: History of  By: Drue Flirt  MD, Merrily Brittle    . STRESS FRACTURE, TIBIA 06/15/2009   Qualifier: Diagnosis of  By: Drue Flirt  MD, Merrily Brittle    . Thrombocytopenia ([place]) 01/30/2014  . Tibialis tendinitis 08/12/2009   Qualifier: Diagnosis of  By: Oneida Alar MD, KARL    . Tinnitus 11/13/2013  . TROCHANTERIC BURSITIS, LEFT 04/27/2010   Qualifier: Diagnosis of  By:  Ernestina Patches MD, Remo Lipps    . UNEQUAL LEG LENGTH 09/18/2008   Qualifier: Diagnosis of  By: Oneida Alar MD, KARL      Past Surgical History:  Procedure Laterality Date  . ABDOMINAL SURGERY    . ABDOMINAL SURGERY     for removal of abdominal mass-cancerous  . KNEE SURGERY    . MANDIBLE FRACTURE SURGERY    . NASAL SINUS SURGERY    . TONSILLECTOMY      Current Medications: Current Meds  Medication Sig  . albuterol (PROVENTIL HFA) 108 (90 BASE) MCG/ACT inhaler Inhale 2 puffs into the lungs every 6 (six) hours as needed for wheezing.  Marland Kitchen omeprazole (PRILOSEC)  20 MG capsule Take 1 capsule by mouth as needed.  . rosuvastatin (CRESTOR) 10 MG tablet Take 10 mg by mouth daily.      Allergies:   Acetaminophen; Aspirin; Nsaids; and Pravastatin   Social History   Socioeconomic History  . Marital status: Married    Spouse name: Not on file  . Number of children: Not on file  . Years of education: Not on file  . Highest education level: Not on file  Occupational History  . Not on file  Social Needs  . Financial resource strain: Not on file  . Food insecurity:    Worry: Not on file    Inability: Not on file  . Transportation needs:    Medical: Not on file    Non-medical: Not on file  Tobacco Use  . Smoking status: Never Smoker  . Smokeless tobacco: Never Used  Substance and Sexual Activity  . Alcohol use: Yes    Comment: occ glass of wine  . Drug use: No  . Sexual activity: Not on file  Lifestyle  . Physical activity:    Days per week: Not on file    Minutes per session: Not on file  . Stress: Not on file  Relationships  . Social connections:    Talks on phone: Not on file    Gets together: Not on file    Attends religious service: Not on file    Active member of club or organization: Not on file    Attends meetings of clubs or organizations: Not on file    Relationship status: Not on file  Other Topics Concern  . Not on file  Social History Narrative  . Not on file     Family  History: The patient's family history includes COPD in her mother; Diabetes in her father and sister; Parkinson's disease in her father; Pulmonary fibrosis in her father; Thyroid disease in her sister. There is no history of Heart attack.  ROS:   Review of Systems  Constitution: Negative.  HENT: Negative.   Eyes: Negative.   Cardiovascular: Positive for chest pain.  Respiratory: Positive for shortness of breath and wheezing.   Endocrine: Negative.   Hematologic/Lymphatic: Negative.   Skin: Negative.   Musculoskeletal: Negative.   Gastrointestinal: Positive for heartburn.  Genitourinary: Negative.   Neurological: Negative.   Psychiatric/Behavioral: Negative.   Allergic/Immunologic: Negative.    Please see the history of present illness.     All other systems reviewed and are negative.  EKGs/Labs/Other Studies Reviewed:    The following studies were reviewed today:   EKG:  EKG is  ordered today.  The ekg ordered today demonstrates sinus rhythm first-degree AV block  Recent Labs: No results found for requested labs within last 8760 hours.  Recent Lipid Panel   07/27/16 Chol 145 HDL 77 LDL 55 No results found for: CHOL, TRIG, HDL, CHOLHDL, VLDL, LDLCALC, LDLDIRECT  Physical Exam:    VS:  BP 122/80 (BP Location: Left Arm, Patient Position: Sitting, Cuff Size: Normal)   Pulse 66   Ht 5' (1.524 m)   Wt 130 lb 12.8 oz (59.3 kg)   SpO2 98%   BMI 25.55 kg/m     Wt Readings from Last 3 Encounters:  03/07/18 130 lb 12.8 oz (59.3 kg)  01/02/18 125 lb (56.7 kg)  11/10/17 128 lb (58.1 kg)     GEN:  Well nourished, well developed in no acute distress HEENT: Normal NECK: No JVD; No carotid bruits LYMPHATICS: No  lymphadenopathy CARDIAC: RRR, no murmurs, rubs, gallops RESPIRATORY:  Clear to auscultation without rales, wheezing or rhonchi  ABDOMEN: Soft, non-tender, non-distended MUSCULOSKELETAL:  No edema; No deformity  SKIN: Warm and dry NEUROLOGIC:  Alert and oriented x  3 PSYCHIATRIC:  Normal affect     Signed, Shirlee More, MD  03/07/2018 12:22 PM    [facility]

## 2018-03-07 ENCOUNTER — Encounter: Payer: Self-pay | Admitting: Cardiology

## 2018-03-07 ENCOUNTER — Telehealth: Payer: Self-pay | Admitting: Cardiology

## 2018-03-07 ENCOUNTER — Ambulatory Visit: Payer: 59 | Admitting: Cardiology

## 2018-03-07 VITALS — BP 122/80 | HR 66 | Ht 60.0 in | Wt 130.8 lb

## 2018-03-07 DIAGNOSIS — J4520 Mild intermittent asthma, uncomplicated: Secondary | ICD-10-CM

## 2018-03-07 DIAGNOSIS — E785 Hyperlipidemia, unspecified: Secondary | ICD-10-CM | POA: Diagnosis not present

## 2018-03-07 DIAGNOSIS — R079 Chest pain, unspecified: Secondary | ICD-10-CM | POA: Diagnosis not present

## 2018-03-07 NOTE — Telephone Encounter (Signed)
Not until after stress test

## 2018-03-07 NOTE — Patient Instructions (Signed)
Medication Instructions:  Your physician recommends that you continue on your current medications as directed. Please refer to the Current Medication list given to you today.  If you need a refill on your cardiac medications before your next appointment, please call your pharmacy.   Lab work: None  If you have labs (blood work) drawn today and your tests are completely normal, you will receive your results only by: Marland Kitchen MyChart Message (if you have MyChart) OR . A paper copy in the mail If you have any lab test that is abnormal or we need to change your treatment, we will call you to review the results.  Testing/Procedures: You had an EKG today.   Your physician has requested that you have a stress echocardiogram. For further information please visit [email or web address]. Please follow instruction sheet as given.  Follow-Up: At [facility], you and your health needs are our priority.  As part of our continuing mission to provide you with exceptional heart care, we have created designated Provider Care Teams.  These Care Teams include your primary Cardiologist (physician) and Advanced Practice Providers (APPs -  Physician Assistants and Nurse Practitioners) who all work together to provide you with the care you need, when you need it. You will need a follow up appointment in 4 weeks.      Exercise Stress Electrocardiogram An exercise stress electrocardiogram is a test to check how blood flows to your heart. It is done to find areas of poor blood flow. You will need to walk on a treadmill for this test. The electrocardiogram will record your heartbeat when you are at rest and when you are exercising. What happens before the procedure?  Do not have drinks with caffeine or foods with caffeine for 24 hours before the test, or as told by your doctor. This includes coffee, tea (even decaf tea), sodas, chocolate, and cocoa.  Follow your doctor's instructions about eating and drinking before the  test.  Ask your doctor what medicines you should or should not take before the test. Take your medicines with water unless told by your doctor not to.  If you use an inhaler, bring it with you to the test.  Bring a snack to eat after the test.  Do not  smoke for 4 hours before the test.  Do not put lotions, powders, creams, or oils on your chest before the test.  Wear comfortable shoes and clothing. What happens during the procedure?  You will have patches put on your chest. Small areas of your chest may need to be shaved. Wires will be connected to the patches.  Your heart rate will be watched while you are resting and while you are exercising.  You will walk on the treadmill. The treadmill will slowly get faster to raise your heart rate.  The test will take about 1-2 hours. What happens after the procedure?  Your heart rate and blood pressure will be watched after the test.  You may return to your normal diet, activities, and medicines or as told by your doctor. This information is not intended to replace advice given to you by your health care provider. Make sure you discuss any questions you have with your health care provider. Document Released: 10/12/2007 Document Revised: 12/23/2015 Document Reviewed: 12/31/2012 Elsevier Interactive Patient Education  [employer].

## 2018-03-07 NOTE — Telephone Encounter (Signed)
Patient informed to wait until after stress echocardiogram is complete on Thursday, 03/15/18, before resuming marathon training/exercise. Patient verbalized understanding. No further questions.

## 2018-03-07 NOTE — Telephone Encounter (Signed)
Patient called and said she forgot to ask Dr Bettina Gavia if she can start running and training for marathons again.  Please call her on her cell, [phone]

## 2018-03-15 ENCOUNTER — Ambulatory Visit (HOSPITAL_BASED_OUTPATIENT_CLINIC_OR_DEPARTMENT_OTHER)
Admission: RE | Admit: 2018-03-15 | Discharge: 2018-03-15 | Disposition: A | Discharge status: Home / Self Care | Payer: 59 | Source: Ambulatory Visit | Attending: Cardiology | Admitting: Cardiology

## 2018-03-15 DIAGNOSIS — R079 Chest pain, unspecified: Secondary | ICD-10-CM | POA: Insufficient documentation

## 2018-03-15 MED FILL — ROSUVASTATIN CALCIUM 10 MG: 10 | 90 days supply | Qty: 90 | Fill #1

## 2018-03-15 NOTE — Progress Notes (Signed)
  Echocardiogram 2D Echocardiogram has been performed.  Rhonda [name] Rhonda [name] 03/15/2018, 10:00 AM

## 2018-03-21 DIAGNOSIS — J309 Allergic rhinitis, unspecified: Secondary | ICD-10-CM | POA: Diagnosis not present

## 2018-03-21 DIAGNOSIS — I440 Atrioventricular block, first degree: Secondary | ICD-10-CM

## 2018-03-21 DIAGNOSIS — J4520 Mild intermittent asthma, uncomplicated: Secondary | ICD-10-CM | POA: Diagnosis not present

## 2018-03-21 DIAGNOSIS — J0111 Acute recurrent frontal sinusitis: Secondary | ICD-10-CM | POA: Diagnosis not present

## 2018-03-21 HISTORY — DX: Atrioventricular block, first degree: I44.0

## 2018-03-21 MED FILL — predniSONE 10 MG TABS: 10 | 6 days supply | Qty: 21 | Fill #0

## 2018-03-21 MED FILL — AZITHROMYCIN 250 MG TABLET: 250 | 5 days supply | Qty: 6 | Fill #0

## 2018-03-29 DIAGNOSIS — Z23 Encounter for immunization: Secondary | ICD-10-CM | POA: Diagnosis not present

## 2018-04-04 ENCOUNTER — Encounter: Payer: Self-pay | Admitting: Emergency Medicine

## 2018-04-04 ENCOUNTER — Ambulatory Visit: Payer: 59 | Admitting: Emergency Medicine

## 2018-04-04 DIAGNOSIS — J4520 Mild intermittent asthma, uncomplicated: Secondary | ICD-10-CM | POA: Diagnosis not present

## 2018-04-04 NOTE — Assessment & Plan Note (Addendum)
Recent flare that was treated with prednisone.  It sounds like she had nasal sinus symptoms at the same time, treated for chronic sinusitis although speculates she may have also been dealing with seasonal allergies given the time of year.  Clearly her poorly controlled GERD was a exacerbating factor as well.  She will stay on GERD therapy.  Depending on the pattern of any additional symptoms, albuterol use, flares we may decide to treat her for seasonal allergies going forward.  Flu shot and [id number] are up-to-date  Keep albuterol available to use 2 puffs up to every 4 hours if needed for shortness of breath, chest tightness, wheezing.  Use 2 puffs of albuterol about 10 minutes before exercise. Continue your omeprazole as you have been taking it. Depending on how well controlled your asthma is going forward we may decide to treat you for seasonal allergies. Influenza and pneumonia vaccines are up-to-date. Follow with Dr Lamonte Sakai in 6 months or sooner if you have any problems

## 2018-04-04 NOTE — Patient Instructions (Signed)
Keep albuterol available to use 2 puffs up to every 4 hours if needed for shortness of breath, chest tightness, wheezing.  Use 2 puffs of albuterol about 10 minutes before exercise. Continue your omeprazole as you have been taking it. Depending on how well controlled your asthma is going forward we may decide to treat you for seasonal allergies. Influenza and pneumonia vaccines are up-to-date. Follow with Dr Lamonte Sakai in 6 months or sooner if you have any problems

## 2018-04-04 NOTE — Progress Notes (Signed)
Subjective:    Patient ID: Rhonda [name], female    DOB: 1950/08/31, 67 y.o.   MRN: [id number]  Asthma  She complains of shortness of breath. There is no cough or wheezing. Pertinent negatives include no ear pain, fever, headaches, postnasal drip, rhinorrhea, sneezing, sore throat or trouble swallowing. Her past medical history is significant for asthma.   67 year old never smoker with a history of hyperlipidemia.  She carries a history of asthma that was made by her PCP recently. She has been experiencing chest tightness with exercise / running, then some cough, has been problematic over the last year. Has also noticed some episodes of dry cough, feels like UA irritation, can be associated with some voice. She uses albuterol occasionally > helps some, but still some tightness present  ROV 10/03/17 --this is a follow-up visit for evaluation of upper airway irritation, cough, chest tightness with some associated shortness of breath.  It happens most often with exercise.  We tried starting loratadine to see if there was an impact on allergic contribution. Also started pre-treating her runs with albuterol > has helped her chest pressure. She underwent pulmonary function testing today that I have reviewed.  This shows spirometry with normal airflows but with a positive bronchodilator response consistent with obstruction.  Her lung volumes were normal and her diffusion capacity is normal.   ROV 04/04/18 --Rhonda [name] is 43, never smoker who follows up today for her upper airway irritation with cough, newly confirmed mild asthma. She has albuterol to use as needed and before exercise. Since last time in 03-11-23 her father passed away. She had some chest discomfort, underwent cardiac eval with Dr Bettina Gavia with a reassuring stress test. Sx improved with omeprazole. She was continuing to have dyspnea, seemed to stem from the GERD flaring. She also had some sinus pressure and HA, nasal obstruction at that time. She  was recently treated with pred and abx for a possible AE and sinus infxn. She had her flu shot and PNA shot last week w Dr Maralyn Sago.    Review of Systems  Constitutional: Negative for fever and unexpected weight change.  HENT: Negative for congestion, dental problem, ear pain, nosebleeds, postnasal drip, rhinorrhea, sinus pressure, sneezing, sore throat and trouble swallowing.   Eyes: Negative for redness and itching.  Respiratory: Positive for chest tightness and shortness of breath. Negative for cough and wheezing.   Cardiovascular: Negative for palpitations and leg swelling.  Gastrointestinal: Negative for nausea and vomiting.  Genitourinary: Negative for dysuria.  Musculoskeletal: Negative for joint swelling.  Skin: Negative for rash.  Neurological: Negative for headaches.  Hematological: Does not bruise/bleed easily.  Psychiatric/Behavioral: Negative for dysphoric mood. The patient is not nervous/anxious.        Objective:   Physical Exam Vitals:   04/04/18 1130  BP: 140/90  [name]: 63  SpO2: 99%  Weight: 131 lb (59.4 kg)  Height: 5' (1.524 m)   Gen: Pleasant, well-nourished, in no distress,  normal affect  ENT: No lesions,  mouth clear,  oropharynx clear, no postnasal drip  Neck: No JVD, no stridor  Lungs: No use of accessory muscles, no dullness to percussion, clear without rales or rhonchi  Cardiovascular: RRR, heart sounds normal, no murmur or gallops, no peripheral edema  Musculoskeletal: No deformities, no cyanosis or clubbing  Neuro: alert, non focal  Skin: Warm, no lesions or rash      Assessment & Plan:  Mild intermittent asthma without complication Recent flare that was treated with prednisone.  It sounds like she had nasal sinus symptoms at the same time, treated for chronic sinusitis although speculates she may have also been dealing with seasonal allergies given the time of year.  Clearly her poorly controlled GERD was a exacerbating factor as well.   She will stay on GERD therapy.  Depending on the pattern of any additional symptoms, albuterol use, flares we may decide to treat her for seasonal allergies going forward.  Flu shot and [id number] are up-to-date  Keep albuterol available to use 2 puffs up to every 4 hours if needed for shortness of breath, chest tightness, wheezing.  Use 2 puffs of albuterol about 10 minutes before exercise. Continue your omeprazole as you have been taking it. Depending on how well controlled your asthma is going forward we may decide to treat you for seasonal allergies. Influenza and pneumonia vaccines are up-to-date. Follow with Dr Lamonte Sakai in 6 months or sooner if you have any problems  Rhonda Apo, MD, PhD 04/04/2018, 12:00 PM [facility] [phone] or if no answer [phone]

## 2018-04-15 NOTE — Progress Notes (Signed)
Cardiology Office Note:    Date:  04/16/2018   ID:  Rhonda [name], DOB 18-Sep-1950, MRN [id number]  PCP:  Loraine Leriche., MD  Cardiologist:  Shirlee More, MD    Referring MD: Stefanie Libel, MD    ASSESSMENT:    1. Chest pain in adult   2. Mild intermittent asthma without complication    PLAN:    In order of problems listed above:  1. Chest pain improved resolved in retrospect this was exercise-induced asthma she will continue to use her current inhaler.  Reviewed the results of her stress echo consider particularly low cardiovascular risk after nice discussion neither of [place] feel she needs further cardiac evaluation likely cardiac CTA at this time   Next appointment: 1 year at her request she is concerned about her long-term cardiovascular prognosis   Medication Adjustments/Labs and Tests Ordered: Current medicines are reviewed at length with the patient today.  Concerns regarding medicines are outlined above.  No orders of the defined types were placed in this encounter.  No orders of the defined types were placed in this encounter.   Chief Complaint  Patient presents with  . Chest Pain    after stress echo    History of Present Illness:    Rhonda [name] is a 67 y.o. female with a hx of chest pain last seen 03/07/18.A stress echo was normal at 13.4 mets. Compliance with diet, lifestyle and medications: Yes She feels back to normal is ready to start endurance training no chest pain shortness of breath palpitation or syncope. Past Medical History:  Diagnosis Date  . Achilles tendinitis of right lower extremity 06/19/2014   US showed minor changes in December 15  Most sxs seem to be enthesopathy  Note had reaction to NTG in past  Overview:  Overview:  US showed minor changes in December 15  Most sxs seem to be enthesopathy  Note had reaction to NTG in past  Last Assessment & Plan:  Recent irritation in the race appears to be a partial plantaris tear  This  continues to give her some Achilles symptoms  I think a period  . ALLERGIC RHINITIS 10/12/2006   Qualifier: Diagnosis of  By: McDiarmid MD, Sherren Mocha    . Ankle pain 08/17/2011  . Asthma 10/12/2006   Qualifier: Diagnosis of  By: McDiarmid MD, Sherren Mocha    Overview:  Overview:  Qualifier: Diagnosis of  By: McDiarmid MD, Sherren Mocha   . CARCINOMA, BASAL CELL 02/07/2008   Qualifier: History of  By: Drue Flirt  MD, Merrily Brittle    Overview:  Overview:  Qualifier: History of  By: Drue Flirt  MD, Merrily Brittle   Overview:  Qualifier: History of  By: Drue Flirt  MD, Merrily Brittle   . Chest pain in adult 08/17/2011   This has worrisome features particularly with her level of exertion   . HAMMER TOE, OTHER, ACQUIRED 10/13/2006   Qualifier: Diagnosis of  By: McDiarmid MD, Sherren Mocha    Overview:  Overview:  Qualifier: Diagnosis of  By: McDiarmid MD, Sherren Mocha   . High cholesterol   . Hyperlipidemia LDL goal <130 10/24/2013  . Injury of plantaris muscle or tendon 10/30/2014  . Insomnia 11/13/2013  . Knee pain, left 06/15/2017  . LEG PAIN, RIGHT 09/22/2009   Qualifier: Diagnosis of  By: Oneida Alar MD, KARL    . Malignant neoplasm of female breast ([place]) 02/07/2008   Qualifier: History of  By: Drue Flirt  MD, Merrily Brittle    Overview:  Overview:  Qualifier: History of  ByDrue Flirt  MD, Naomi.Beaver  Overview:  Overview:  Qualifier: History of  By: Drue Flirt  MD, Naomi.Beaver   . Metatarsal stress fracture 08/29/2012   This is at the distal shaft of the fifth metatarsal left foot   . Mild intermittent asthma without complication 4/69/6295  . Muscle tear 11/15/2012   Left rectus femoris; also like tendon tear   . Numbness and tingling of foot 03/29/2012  . Patellar tendinitis 03/07/2013   I suspect this occurred because she did less crosstraining before this marathon and was probably hamstring dominant   . Right foot pain 04/06/2016  . Right knee pain 04/23/2013   04/23/13  ultrasound today RT knee  revealed no significant effusion The quadriceps tendon is intact as is the patellar tendon  The medial meniscus was normal The lateral meniscus had an area of calcification and a very small area of splitting along the midline to the posterior third of the joint line   . Rotator cuff tendinitis, left 09/08/2016   Overview:  Last Assessment & Plan:  Significant improvement  See OV summary  . Sensorineural hearing loss 11/13/2013  . Stress fracture of pelvis 08/29/2007   Qualifier: History of  By: Drue Flirt  MD, Merrily Brittle    . STRESS FRACTURE, TIBIA 06/15/2009   Qualifier: Diagnosis of  By: Drue Flirt  MD, Merrily Brittle    . Thrombocytopenia ([place]) 01/30/2014  . Tibialis tendinitis 08/12/2009   Qualifier: Diagnosis of  By: Oneida Alar MD, KARL    . Tinnitus 11/13/2013  . TROCHANTERIC BURSITIS, LEFT 04/27/2010   Qualifier: Diagnosis of  By: Ernestina Patches MD, Remo Lipps    . UNEQUAL LEG LENGTH 09/18/2008   Qualifier: Diagnosis of  By: Oneida Alar MD, KARL      Past Surgical History:  Procedure Laterality Date  . ABDOMINAL SURGERY    . ABDOMINAL SURGERY     for removal of abdominal mass-cancerous  . KNEE SURGERY    . MANDIBLE FRACTURE SURGERY    . NASAL SINUS SURGERY    . TONSILLECTOMY      Current Medications: Current Meds  Medication Sig  . albuterol (PROVENTIL HFA) 108 (90 BASE) MCG/ACT inhaler Inhale 2 puffs into the lungs every 6 (six) hours as needed for wheezing.  Marland Kitchen omeprazole (PRILOSEC) 20 MG capsule Take 1 capsule by mouth as needed.  . rosuvastatin (CRESTOR) 10 MG tablet Take 10 mg by mouth daily.      Allergies:   Acetaminophen; Aspirin; Nsaids; and Pravastatin   Social History   Socioeconomic History  . Marital status: Married    Spouse name: Not on file  . Number of children: Not on file  . Years of education: Not on file  . Highest education level: Not on file  Occupational History  . Not on file  Social Needs  . Financial resource strain: Not on file  . Food insecurity:    Worry: Not on file    Inability: Not on file  . Transportation needs:    Medical: Not on file    Non-medical: Not on  file  Tobacco Use  . Smoking status: Never Smoker  . Smokeless tobacco: Never Used  Substance and Sexual Activity  . Alcohol use: Yes    Comment: occ glass of wine  . Drug use: No  . Sexual activity: Not on file  Lifestyle  . Physical activity:    Days per week: Not on file    Minutes per session: Not on file  . Stress: Not on file  Relationships  .  Social connections:    Talks on phone: Not on file    Gets together: Not on file    Attends religious service: Not on file    Active member of club or organization: Not on file    Attends meetings of clubs or organizations: Not on file    Relationship status: Not on file  Other Topics Concern  . Not on file  Social History Narrative  . Not on file     Family History: The patient's family history includes COPD in her mother; Diabetes in her father and sister; Parkinson's disease in her father; Pulmonary fibrosis in her father; Thyroid disease in her sister. There is no history of Heart attack. ROS:   Please see the history of present illness.    All other systems reviewed and are negative.  EKGs/Labs/Other Studies Reviewed:    The following studies were reviewed today:   Recent Labs: No results found for requested labs within last 8760 hours.  Recent Lipid Panel No results found for: CHOL, TRIG, HDL, CHOLHDL, VLDL, LDLCALC, LDLDIRECT  Physical Exam:    VS:  BP 132/74 (BP Location: Right Arm, Patient Position: Sitting, Cuff Size: Normal)   Pulse 68   Ht 5' (1.524 m)   Wt 136 lb 12.8 oz (62.1 kg)   SpO2 98%   BMI 26.72 kg/m     Wt Readings from Last 3 Encounters:  04/16/18 136 lb 12.8 oz (62.1 kg)  04/04/18 131 lb (59.4 kg)  03/07/18 130 lb 12.8 oz (59.3 kg)     GEN:  Well nourished, well developed in no acute distress HEENT: Normal NECK: No JVD; No carotid bruits LYMPHATICS: No lymphadenopathy CARDIAC: RRR, no murmurs, rubs, gallops RESPIRATORY:  Clear to auscultation without rales, wheezing or rhonchi    ABDOMEN: Soft, non-tender, non-distended MUSCULOSKELETAL:  No edema; No deformity  SKIN: Warm and dry NEUROLOGIC:  Alert and oriented x 3 PSYCHIATRIC:  Normal affect    Signed, Shirlee More, MD  04/16/2018 4:22 PM    [facility]

## 2018-04-16 ENCOUNTER — Ambulatory Visit: Payer: 59 | Admitting: Cardiology

## 2018-04-16 ENCOUNTER — Encounter: Payer: Self-pay | Admitting: Cardiology

## 2018-04-16 VITALS — BP 132/74 | HR 68 | Ht 60.0 in | Wt 136.8 lb

## 2018-04-16 DIAGNOSIS — R079 Chest pain, unspecified: Secondary | ICD-10-CM

## 2018-04-16 DIAGNOSIS — J4520 Mild intermittent asthma, uncomplicated: Secondary | ICD-10-CM

## 2018-05-07 ENCOUNTER — Ambulatory Visit
Admission: RE | Admit: 2018-05-07 | Discharge: 2018-05-07 | Disposition: A | Discharge status: Home / Self Care | Payer: 59 | Source: Ambulatory Visit | Attending: Obstetrics and Gynecology | Admitting: Obstetrics and Gynecology

## 2018-05-07 DIAGNOSIS — Z1231 Encounter for screening mammogram for malignant neoplasm of breast: Secondary | ICD-10-CM

## 2018-05-08 DIAGNOSIS — Z01419 Encounter for gynecological examination (general) (routine) without abnormal findings: Secondary | ICD-10-CM | POA: Diagnosis not present

## 2018-05-08 DIAGNOSIS — Z6826 Body mass index (BMI) 26.0-26.9, adult: Secondary | ICD-10-CM | POA: Diagnosis not present

## 2018-05-10 MED FILL — VENTOLIN HFA 90 MCG INHALER: 108 (90 BAS | 25 days supply | Qty: 18 | Fill #1

## 2018-05-24 DIAGNOSIS — Z124 Encounter for screening for malignant neoplasm of cervix: Secondary | ICD-10-CM | POA: Diagnosis not present

## 2018-05-24 DIAGNOSIS — J45990 Exercise induced bronchospasm: Secondary | ICD-10-CM

## 2018-05-24 DIAGNOSIS — Z1212 Encounter for screening for malignant neoplasm of rectum: Secondary | ICD-10-CM | POA: Diagnosis not present

## 2018-05-24 DIAGNOSIS — Z1211 Encounter for screening for malignant neoplasm of colon: Secondary | ICD-10-CM | POA: Diagnosis not present

## 2018-05-24 HISTORY — DX: Exercise induced bronchospasm: J45.990

## 2018-06-05 MED FILL — ROSUVASTATIN CALCIUM 10 MG: 10 | 90 days supply | Qty: 90 | Fill #2

## 2018-07-20 DIAGNOSIS — R7309 Other abnormal glucose: Secondary | ICD-10-CM | POA: Diagnosis not present

## 2018-07-20 DIAGNOSIS — E785 Hyperlipidemia, unspecified: Secondary | ICD-10-CM | POA: Diagnosis not present

## 2018-07-20 DIAGNOSIS — D696 Thrombocytopenia, unspecified: Secondary | ICD-10-CM | POA: Diagnosis not present

## 2018-07-23 DIAGNOSIS — K219 Gastro-esophageal reflux disease without esophagitis: Secondary | ICD-10-CM

## 2018-07-23 DIAGNOSIS — R7309 Other abnormal glucose: Secondary | ICD-10-CM | POA: Diagnosis not present

## 2018-07-23 DIAGNOSIS — Z0000 Encounter for general adult medical examination without abnormal findings: Secondary | ICD-10-CM | POA: Diagnosis not present

## 2018-07-23 DIAGNOSIS — J4520 Mild intermittent asthma, uncomplicated: Secondary | ICD-10-CM | POA: Diagnosis not present

## 2018-07-23 DIAGNOSIS — E785 Hyperlipidemia, unspecified: Secondary | ICD-10-CM | POA: Diagnosis not present

## 2018-07-23 DIAGNOSIS — D696 Thrombocytopenia, unspecified: Secondary | ICD-10-CM | POA: Diagnosis not present

## 2018-07-23 DIAGNOSIS — Z1159 Encounter for screening for other viral diseases: Secondary | ICD-10-CM | POA: Diagnosis not present

## 2018-07-23 HISTORY — DX: Gastro-esophageal reflux disease without esophagitis: K21.9

## 2018-07-24 MED FILL — OMEPRAZOLE 20 MG CPDR: 20 | 90 days supply | Qty: 180 | Fill #1

## 2018-08-06 MED FILL — VENTOLIN HFA 90 MCG INHALER: 108 (90 BAS | 25 days supply | Qty: 18 | Fill #0

## 2018-08-17 MED FILL — ROSUVASTATIN CALCIUM 10 MG: 10 | 90 days supply | Qty: 90 | Fill #0

## 2018-09-03 ENCOUNTER — Ambulatory Visit: Payer: 59 | Admitting: Emergency Medicine

## 2018-11-05 DIAGNOSIS — Z85828 Personal history of other malignant neoplasm of skin: Secondary | ICD-10-CM | POA: Diagnosis not present

## 2018-11-05 DIAGNOSIS — D2271 Melanocytic nevi of right lower limb, including hip: Secondary | ICD-10-CM | POA: Diagnosis not present

## 2018-11-05 DIAGNOSIS — L821 Other seborrheic keratosis: Secondary | ICD-10-CM | POA: Diagnosis not present

## 2018-11-05 DIAGNOSIS — D225 Melanocytic nevi of trunk: Secondary | ICD-10-CM | POA: Diagnosis not present

## 2018-11-05 DIAGNOSIS — L814 Other melanin hyperpigmentation: Secondary | ICD-10-CM | POA: Diagnosis not present

## 2018-11-27 NOTE — Progress Notes (Signed)
Cardiology Office Note:    Date:  11/28/2018   ID:  Rhonda [name], DOB 1950-08-31, MRN [id number]  PCP:  Loraine Leriche., MD  Cardiologist:  Shirlee More, MD    Referring MD: Loraine Leriche.,*    ASSESSMENT:    1. Chest pain in adult   2. Mild intermittent asthma without complication   3. Hyperlipidemia LDL goal <130    PLAN:    In order of problems listed above:  1. We are both reassured by her clinical course and stress echo at this time do not think she requires any further ischemic evaluation.  She will continue use her bronchodilator before exercise and statin.   Next appointment: As needed   Medication Adjustments/Labs and Tests Ordered: Current medicines are reviewed at length with the patient today.  Concerns regarding medicines are outlined above.  No orders of the defined types were placed in this encounter.  No orders of the defined types were placed in this encounter.   Chief Complaint  Patient presents with  . Follow-up    History of Present Illness:    Rhonda [name] is a 68 y.o. female with a hx of chest pain with exercise induced asthma last seen 04/16/2018.  She had a stress echo 03/07/2018 which was normal and a high exercise tolerance 13.4 METS at the time of her last visit she was doing well and we did not feel the necessity to do a cardiac CTA. Compliance with diet, lifestyle and medications: Yes  this visit today was in follow-up to be sure that she was doing well She is returned to running preparing for marathon and is not having chest pain shortness of breath palpitation or syncope. She continues to use her inhaler prior to exercise She tolerates her statin No muscle symptoms Past Medical History:  Diagnosis Date  . Achilles tendinitis of right lower extremity 06/19/2014   US showed minor changes in December 15  Most sxs seem to be enthesopathy  Note had reaction to NTG in past  Overview:  Overview:  US showed minor  changes in December 15  Most sxs seem to be enthesopathy  Note had reaction to NTG in past  Last Assessment & Plan:  Recent irritation in the race appears to be a partial plantaris tear  This continues to give her some Achilles symptoms  I think a period  . ALLERGIC RHINITIS 10/12/2006   Qualifier: Diagnosis of  By: McDiarmid MD, Sherren Mocha    . Ankle pain 08/17/2011  . Asthma 10/12/2006   Qualifier: Diagnosis of  By: McDiarmid MD, Sherren Mocha    Overview:  Overview:  Qualifier: Diagnosis of  By: McDiarmid MD, Sherren Mocha   . CARCINOMA, BASAL CELL 02/07/2008   Qualifier: History of  By: Drue Flirt  MD, Merrily Brittle    Overview:  Overview:  Qualifier: History of  By: Drue Flirt  MD, Merrily Brittle   Overview:  Qualifier: History of  By: Drue Flirt  MD, Merrily Brittle   . Chest pain in adult 08/17/2011   This has worrisome features particularly with her level of exertion   . HAMMER TOE, OTHER, ACQUIRED 10/13/2006   Qualifier: Diagnosis of  By: McDiarmid MD, Sherren Mocha    Overview:  Overview:  Qualifier: Diagnosis of  By: McDiarmid MD, Sherren Mocha   . High cholesterol   . Hyperlipidemia LDL goal <130 10/24/2013  . Injury of plantaris muscle or tendon 10/30/2014  . Insomnia 11/13/2013  . Knee pain, left 06/15/2017  . LEG PAIN, RIGHT 09/22/2009  Qualifier: Diagnosis of  By: Oneida Alar MD, KARL    . Malignant neoplasm of female breast ([place]) 02/07/2008   Qualifier: History of  By: Drue Flirt  MD, Merrily Brittle    Overview:  Overview:  Qualifier: History of  By: Drue Flirt  MD, Merrily Brittle  Overview:  Overview:  Qualifier: History of  By: Drue Flirt  MD, Merrily Brittle   . Metatarsal stress fracture 08/29/2012   This is at the distal shaft of the fifth metatarsal left foot   . Mild intermittent asthma without complication 08/08/270  . Muscle tear 11/15/2012   Left rectus femoris; also like tendon tear   . Numbness and tingling of foot 03/29/2012  . Patellar tendinitis 03/07/2013   I suspect this occurred because she did less crosstraining before this marathon and was probably hamstring dominant    . Right foot pain 04/06/2016  . Right knee pain 04/23/2013   04/23/13  ultrasound today RT knee  revealed no significant effusion The quadriceps tendon is intact as is the patellar tendon The medial meniscus was normal The lateral meniscus had an area of calcification and a very small area of splitting along the midline to the posterior third of the joint line   . Rotator cuff tendinitis, left 09/08/2016   Overview:  Last Assessment & Plan:  Significant improvement  See OV summary  . Sensorineural hearing loss 11/13/2013  . Stress fracture of pelvis 08/29/2007   Qualifier: History of  By: Drue Flirt  MD, Merrily Brittle    . STRESS FRACTURE, TIBIA 06/15/2009   Qualifier: Diagnosis of  By: Drue Flirt  MD, Merrily Brittle    . Thrombocytopenia ([place]) 01/30/2014  . Tibialis tendinitis 08/12/2009   Qualifier: Diagnosis of  By: Oneida Alar MD, KARL    . Tinnitus 11/13/2013  . TROCHANTERIC BURSITIS, LEFT 04/27/2010   Qualifier: Diagnosis of  By: Ernestina Patches MD, Remo Lipps    . UNEQUAL LEG LENGTH 09/18/2008   Qualifier: Diagnosis of  By: Oneida Alar MD, KARL      Past Surgical History:  Procedure Laterality Date  . ABDOMINAL SURGERY    . ABDOMINAL SURGERY     for removal of abdominal mass-cancerous  . KNEE SURGERY    . MANDIBLE FRACTURE SURGERY    . NASAL SINUS SURGERY    . TONSILLECTOMY      Current Medications: Current Meds  Medication Sig  . albuterol (PROVENTIL HFA) 108 (90 BASE) MCG/ACT inhaler Inhale 2 puffs into the lungs every 6 (six) hours as needed for wheezing.  Marland Kitchen omeprazole (PRILOSEC) 20 MG capsule Take 1 capsule by mouth as needed.  . rosuvastatin (CRESTOR) 10 MG tablet Take 10 mg by mouth daily.      Allergies:   Acetaminophen, Aspirin, Nsaids, and Pravastatin   Social History   Socioeconomic History  . Marital status: Married    Spouse name: Not on file  . Number of children: Not on file  . Years of education: Not on file  . Highest education level: Not on file  Occupational History  . Not on file  Social  Needs  . Financial resource strain: Not on file  . Food insecurity    Worry: Not on file    Inability: Not on file  . Transportation needs    Medical: Not on file    Non-medical: Not on file  Tobacco Use  . Smoking status: Never Smoker  . Smokeless tobacco: Never Used  Substance and Sexual Activity  . Alcohol use: Yes    Comment: occ glass of wine  . Drug  use: No  . Sexual activity: Not on file  Lifestyle  . Physical activity    Days per week: Not on file    Minutes per session: Not on file  . Stress: Not on file  Relationships  . Social Herbalist on phone: Not on file    Gets together: Not on file    Attends religious service: Not on file    Active member of club or organization: Not on file    Attends meetings of clubs or organizations: Not on file    Relationship status: Not on file  Other Topics Concern  . Not on file  Social History Narrative  . Not on file     Family History: The patient's family history includes COPD in her mother; Diabetes in her father and sister; Parkinson's disease in her father; Pulmonary fibrosis in her father; Thyroid disease in her sister. There is no history of Heart attack. ROS:   Please see the history of present illness.    All other systems reviewed and are negative.  EKGs/Labs/Other Studies Reviewed:    The following studies were reviewed today:   Recent Labs: No results found for requested labs within last 8760 hours.  Recent Lipid Panel No results found for: CHOL, TRIG, HDL, CHOLHDL, VLDL, LDLCALC, LDLDIRECT  Physical Exam:    VS:  BP 130/88 (BP Location: Left Arm, Patient Position: Sitting, Cuff Size: Normal)   Pulse 76   Temp (!) 97.3 F (36.3 C)   Ht 5' (1.524 m)   Wt 133 lb 9.6 oz (60.6 kg)   SpO2 98%   BMI 26.09 kg/m     Wt Readings from Last 3 Encounters:  11/28/18 133 lb 9.6 oz (60.6 kg)  04/16/18 136 lb 12.8 oz (62.1 kg)  04/04/18 131 lb (59.4 kg)     GEN:  Well nourished, well developed in  no acute distress HEENT: Normal NECK: No JVD; No carotid bruits LYMPHATICS: No lymphadenopathy CARDIAC: RRR, no murmurs, rubs, gallops RESPIRATORY:  Clear to auscultation without rales, wheezing or rhonchi  ABDOMEN: Soft, non-tender, non-distended MUSCULOSKELETAL:  No edema; No deformity  SKIN: Warm and dry NEUROLOGIC:  Alert and oriented x 3 PSYCHIATRIC:  Normal affect    Signed, Shirlee More, MD  11/28/2018 2:33 PM    [place]

## 2018-11-28 ENCOUNTER — Other Ambulatory Visit: Payer: Self-pay

## 2018-11-28 ENCOUNTER — Encounter: Payer: Self-pay | Admitting: Cardiology

## 2018-11-28 ENCOUNTER — Telehealth: Payer: Self-pay | Admitting: Cardiology

## 2018-11-28 ENCOUNTER — Ambulatory Visit (INDEPENDENT_AMBULATORY_CARE_PROVIDER_SITE_OTHER): Payer: 59 | Admitting: Cardiology

## 2018-11-28 VITALS — BP 130/88 | HR 76 | Temp 97.3°F | Ht 60.0 in | Wt 133.6 lb

## 2018-11-28 DIAGNOSIS — R079 Chest pain, unspecified: Secondary | ICD-10-CM

## 2018-11-28 DIAGNOSIS — E785 Hyperlipidemia, unspecified: Secondary | ICD-10-CM | POA: Diagnosis not present

## 2018-11-28 DIAGNOSIS — J4520 Mild intermittent asthma, uncomplicated: Secondary | ICD-10-CM

## 2018-11-28 NOTE — Patient Instructions (Signed)
Medication Instructions:  Your physician recommends that you continue on your current medications as directed. Please refer to the Current Medication list given to you today.  If you need a refill on your cardiac medications before your next appointment, please call your pharmacy.   Lab work: NOne If you have labs (blood work) drawn today and your tests are completely normal, you will receive your results only by: Marland Kitchen MyChart Message (if you have MyChart) OR . A paper copy in the mail If you have any lab test that is abnormal or we need to change your treatment, we will call you to review the results.  Testing/Procedures: None  Follow-Up: At [facility], you and your health needs are our priority.  As part of our continuing mission to provide you with exceptional heart care, we have created designated Provider Care Teams.  These Care Teams include your primary Cardiologist (physician) and Advanced Practice Providers (APPs -  Physician Assistants and Nurse Practitioners) who all work together to provide you with the care you need, when you need it. You will need a follow up appointment as needed with Dr. Bettina Gavia Any Other Special Instructions Will Be Listed Below (If Applicable).

## 2018-11-28 NOTE — Telephone Encounter (Signed)
Virtual Visit Pre-Appointment Phone Call  "(Name), I am calling you today to discuss your upcoming appointment. We are currently trying to limit exposure to the virus that causes COVID-19 by seeing patients at home rather than in the office."  1. "What is the BEST phone number to call the day of the visit?" - include this in appointment notes  2. Do you have or have access to (through a family member/friend) a smartphone with video capability that we can use for your visit?" a. If yes - list this number in appt notes as cell (if different from BEST phone #) and list the appointment type as a VIDEO visit in appointment notes b. If no - list the appointment type as a PHONE visit in appointment notes  3. Confirm consent - "In the setting of the current Covid19 crisis, you are scheduled for a (phone or video) visit with your provider on (date) at (time).  Just as we do with many in-office visits, in order for you to participate in this visit, we must obtain consent.  If you'd like, I can send this to your mychart (if signed up) or email for you to review.  Otherwise, I can obtain your verbal consent now.  All virtual visits are billed to your insurance company just like a normal visit would be.  By agreeing to a virtual visit, we'd like you to understand that the technology does not allow for your provider to perform an examination, and thus may limit your provider's ability to fully assess your condition. If your provider identifies any concerns that need to be evaluated in person, we will make arrangements to do so.  Finally, though the technology is pretty good, we cannot assure that it will always work on either your or our end, and in the setting of a video visit, we may have to convert it to a phone-only visit.  In either situation, we cannot ensure that we have a secure connection.  Are you willing to proceed?" STAFF: Did the patient verbally acknowledge consent to telehealth visit? Document  YES/NO here: Yes per pt  4. Advise patient to be prepared - "Two hours prior to your appointment, go ahead and check your blood pressure, pulse, oxygen saturation, and your weight (if you have the equipment to check those) and write them all down. When your visit starts, your provider will ask you for this information. If you have an Apple Watch or Kardia device, please plan to have heart rate information ready on the day of your appointment. Please have a pen and paper handy nearby the day of the visit as well."  5. Give patient instructions for MyChart download to smartphone OR Doximity/Doxy.me as below if video visit (depending on what platform provider is using)  6. Inform patient they will receive a phone call 15 minutes prior to their appointment time (may be from unknown caller ID) so they should be prepared to answer    TELEPHONE CALL NOTE  Rhonda [name] has been deemed a candidate for a follow-up tele-health visit to limit community exposure during the Covid-19 pandemic. I spoke with the patient via phone to ensure availability of phone/video source, confirm preferred email & phone number, and discuss instructions and expectations.  I reminded Rhonda [name] to be prepared with any vital sign and/or heart rhythm information that could potentially be obtained via home monitoring, at the time of her visit. I reminded Rhonda [name] to expect a phone call  prior to her visit.  Calla Kicks 11/28/2018 8:42 AM   INSTRUCTIONS FOR DOWNLOADING THE MYCHART APP TO SMARTPHONE  - The patient must first make sure to have activated MyChart and know their login information - If Apple, go to [employer] and type in MyChart in the search bar and download the app. If Android, ask patient to go to [employer] and type in [place] in the search bar and download the app. The app is free but as with any other app downloads, their phone may require them to verify saved payment information or  Apple/Android password.  - The patient will need to then log into the app with their MyChart username and password, and select [facility] as their healthcare provider to link the account. When it is time for your visit, go to the MyChart app, find appointments, and click Begin Video Visit. Be sure to Select Allow for your device to access the Microphone and Camera for your visit. You will then be connected, and your provider will be with you shortly.  **If they have any issues connecting, or need assistance please contact MyChart service desk [phone])**  **If using a computer, in order to ensure the best quality for their visit they will need to use either of the following Internet Browsers: [employer], or Google Chrome**  IF USING DOXIMITY or DOXY.ME - The patient will receive a link just prior to their visit by text.     FULL LENGTH CONSENT FOR TELE-HEALTH VISIT   I hereby voluntarily request, consent and authorize [facility] and its employed or contracted physicians, physician assistants, nurse practitioners or other licensed health care professionals (the Practitioner), to provide me with telemedicine health care services (the Services") as deemed necessary by the treating Practitioner. I acknowledge and consent to receive the Services by the Practitioner via telemedicine. I understand that the telemedicine visit will involve communicating with the Practitioner through live audiovisual communication technology and the disclosure of certain medical information by electronic transmission. I acknowledge that I have been given the opportunity to request an in-person assessment or other available alternative prior to the telemedicine visit and am voluntarily participating in the telemedicine visit.  I understand that I have the right to withhold or withdraw my consent to the use of telemedicine in the course of my care at any time, without affecting my right to future care  or treatment, and that the Practitioner or I may terminate the telemedicine visit at any time. I understand that I have the right to inspect all information obtained and/or recorded in the course of the telemedicine visit and may receive copies of available information for a reasonable fee.  I understand that some of the potential risks of receiving the Services via telemedicine include:   Delay or interruption in medical evaluation due to technological equipment failure or disruption;  Information transmitted may not be sufficient (e.g. poor resolution of images) to allow for appropriate medical decision making by the Practitioner; and/or   In rare instances, security protocols could fail, causing a breach of personal health information.  Furthermore, I acknowledge that it is my responsibility to provide information about my medical history, conditions and care that is complete and accurate to the best of my ability. I acknowledge that Practitioner's advice, recommendations, and/or decision may be based on factors not within their control, such as incomplete or inaccurate data provided by me or distortions of diagnostic images or specimens that may result from electronic  transmissions. I understand that the practice of medicine is not an exact science and that Practitioner makes no warranties or guarantees regarding treatment outcomes. I acknowledge that I will receive a copy of this consent concurrently upon execution via email to the email address I last provided but may also request a printed copy by calling the office of [facility].    I understand that my insurance will be billed for this visit.   I have read or had this consent read to me.  I understand the contents of this consent, which adequately explains the benefits and risks of the Services being provided via telemedicine.   I have been provided ample opportunity to ask questions regarding this consent and the Services and have had  my questions answered to my satisfaction.  I give my informed consent for the services to be provided through the use of telemedicine in my medical care  By participating in this telemedicine visit I agree to the above.

## 2018-11-29 MED FILL — ROSUVASTATIN CALCIUM 10 MG: 10 | 90 days supply | Qty: 90 | Fill #0

## 2018-12-03 ENCOUNTER — Other Ambulatory Visit: Payer: Self-pay

## 2018-12-03 ENCOUNTER — Ambulatory Visit (INDEPENDENT_AMBULATORY_CARE_PROVIDER_SITE_OTHER): Payer: 59 | Admitting: Emergency Medicine

## 2018-12-03 ENCOUNTER — Encounter: Payer: Self-pay | Admitting: Emergency Medicine

## 2018-12-03 DIAGNOSIS — J4520 Mild intermittent asthma, uncomplicated: Secondary | ICD-10-CM | POA: Diagnosis not present

## 2018-12-03 NOTE — Assessment & Plan Note (Signed)
Intermittent symptoms, often associated with high humidity.  No flares or exacerbations since last time.  She pretreats exercise, is a runner.  This has been beneficial.  No current exercise limitation.  We will continue albuterol as needed.  Talk to her today about the possibility that if her GERD flares she may need to go back on omeprazole, that this can influence how will we control her asthma.  Plan to see her in 6 months or sooner if she has any problems.

## 2018-12-03 NOTE — Progress Notes (Signed)
Virtual Visit via Video Note  I connected with Rhonda [name] on 12/03/18 at 11:45 AM EDT by a video enabled telemedicine application and verified that I am speaking with the correct person using two identifiers.  Location: Patient: Home Provider: Office   I discussed the limitations of evaluation and management by telemedicine and the availability of in person appointments. The patient expressed understanding and agreed to proceed.  History of Present Illness: 68 year old never smoker with a history of chronic cough, upper airway irritation syndrome and mild asthma confirmed on spirometry.  She has had some intermittent chest discomfort with a reassuring cardiology evaluation, improved on omeprazole.  She has albuterol available to use as needed.    Observations/Objective: She has been working from home, had to cancel her races / marathon due to [employer]. She is planning to pick up her training again. She has more dyspnea in the muggy weather. She uses albuterol before some exercise depending on the humidity. She gets benefit from this - able to exert. No real cough or CP now - off omeprazole.   Assessment and Plan: Mild intermittent asthma without complication Intermittent symptoms, often associated with high humidity.  No flares or exacerbations since last time.  She pretreats exercise, is a runner.  This has been beneficial.  No current exercise limitation.  We will continue albuterol as needed.  Talk to her today about the possibility that if her GERD flares she may need to go back on omeprazole, that this can influence how will we control her asthma.  Plan to see her in 6 months or sooner if she has any problems.   Follow Up Instructions: Follow with Dr Lamonte Sakai in 6 months or sooner if you have any problems     I discussed the assessment and treatment plan with the patient. The patient was provided an opportunity to ask questions and all were answered. The patient agreed  with the plan and demonstrated an understanding of the instructions.   The patient was advised to call back or seek an in-person evaluation if the symptoms worsen or if the condition fails to improve as anticipated.  I provided 15 minutes of non-face-to-face time during this encounter.   Collene Gobble, MD

## 2019-01-21 DIAGNOSIS — Z0000 Encounter for general adult medical examination without abnormal findings: Secondary | ICD-10-CM | POA: Diagnosis not present

## 2019-01-21 DIAGNOSIS — R7309 Other abnormal glucose: Secondary | ICD-10-CM | POA: Diagnosis not present

## 2019-01-21 DIAGNOSIS — E785 Hyperlipidemia, unspecified: Secondary | ICD-10-CM | POA: Diagnosis not present

## 2019-01-23 DIAGNOSIS — E785 Hyperlipidemia, unspecified: Secondary | ICD-10-CM | POA: Diagnosis not present

## 2019-01-23 DIAGNOSIS — D696 Thrombocytopenia, unspecified: Secondary | ICD-10-CM | POA: Diagnosis not present

## 2019-01-23 DIAGNOSIS — K219 Gastro-esophageal reflux disease without esophagitis: Secondary | ICD-10-CM | POA: Diagnosis not present

## 2019-01-23 DIAGNOSIS — J4520 Mild intermittent asthma, uncomplicated: Secondary | ICD-10-CM | POA: Diagnosis not present

## 2019-02-06 ENCOUNTER — Other Ambulatory Visit: Payer: Self-pay | Admitting: Obstetrics and Gynecology

## 2019-02-06 DIAGNOSIS — Z1231 Encounter for screening mammogram for malignant neoplasm of breast: Secondary | ICD-10-CM

## 2019-02-11 MED FILL — ROSUVASTATIN CALCIUM 10 MG: 10 | 90 days supply | Qty: 90 | Fill #0

## 2019-02-27 MED FILL — ROSUVASTATIN CALCIUM 10 MG: 10 | 90 days supply | Qty: 90 | Fill #0

## 2019-03-20 ENCOUNTER — Telehealth: Payer: Self-pay | Admitting: Emergency Medicine

## 2019-03-20 NOTE — Telephone Encounter (Signed)
Pt c/o chest tightness x 2 wks  She states that it usually only bothers her at night  She is not having any wheezing, cough or SOB  She feels like this is stress related, issues with spouse and plans to leave him  I offered ov or televisit for tomorrow and she refused, stating will only do in office appt with Dr Lamonte Sakai and was okay with scheduling his first avaialble. I advised to please call for sooner appt or seek emergent care should her symptoms worsen

## 2019-03-25 DIAGNOSIS — B078 Other viral warts: Secondary | ICD-10-CM | POA: Diagnosis not present

## 2019-03-25 DIAGNOSIS — L814 Other melanin hyperpigmentation: Secondary | ICD-10-CM | POA: Diagnosis not present

## 2019-04-16 ENCOUNTER — Emergency Department (HOSPITAL_BASED_OUTPATIENT_CLINIC_OR_DEPARTMENT_OTHER)
Admission: EM | Admit: 2019-04-16 | Discharge: 2019-04-16 | Disposition: A | Discharge status: Home / Self Care | Payer: 59 | Attending: Emergency Medicine | Admitting: Emergency Medicine

## 2019-04-16 ENCOUNTER — Encounter (HOSPITAL_BASED_OUTPATIENT_CLINIC_OR_DEPARTMENT_OTHER): Payer: Self-pay | Admitting: *Deleted

## 2019-04-16 ENCOUNTER — Emergency Department (HOSPITAL_BASED_OUTPATIENT_CLINIC_OR_DEPARTMENT_OTHER): Payer: 59

## 2019-04-16 ENCOUNTER — Other Ambulatory Visit: Payer: Self-pay

## 2019-04-16 DIAGNOSIS — E782 Mixed hyperlipidemia: Secondary | ICD-10-CM | POA: Diagnosis not present

## 2019-04-16 DIAGNOSIS — Z853 Personal history of malignant neoplasm of breast: Secondary | ICD-10-CM | POA: Diagnosis not present

## 2019-04-16 DIAGNOSIS — J45909 Unspecified asthma, uncomplicated: Secondary | ICD-10-CM | POA: Insufficient documentation

## 2019-04-16 DIAGNOSIS — S6992XA Unspecified injury of left wrist, hand and finger(s), initial encounter: Secondary | ICD-10-CM | POA: Diagnosis not present

## 2019-04-16 DIAGNOSIS — Z79899 Other long term (current) drug therapy: Secondary | ICD-10-CM | POA: Diagnosis not present

## 2019-04-16 DIAGNOSIS — Z886 Allergy status to analgesic agent status: Secondary | ICD-10-CM | POA: Diagnosis not present

## 2019-04-16 DIAGNOSIS — Z888 Allergy status to other drugs, medicaments and biological substances status: Secondary | ICD-10-CM | POA: Insufficient documentation

## 2019-04-16 DIAGNOSIS — M25522 Pain in left elbow: Secondary | ICD-10-CM | POA: Diagnosis present

## 2019-04-16 DIAGNOSIS — M25532 Pain in left wrist: Secondary | ICD-10-CM | POA: Diagnosis not present

## 2019-04-16 DIAGNOSIS — M25422 Effusion, left elbow: Secondary | ICD-10-CM | POA: Insufficient documentation

## 2019-04-16 NOTE — Discharge Instructions (Addendum)
Use the arm sling as directed.  Use ice for inflammation.  Given the joint effusion, there may be an occult fracture that was not detected on imaging studies today.  Please follow-up with your sports medicine physician in about 1 week for reexam.

## 2019-04-16 NOTE — ED Triage Notes (Signed)
Pt c/o fall x 30 mins ago , left wrist injury

## 2019-04-16 NOTE — ED Provider Notes (Signed)
[facility] EMERGENCY DEPARTMENT Provider Note   CSN: [id number] Arrival date & time: 04/16/19  1844     History   Chief Complaint Chief Complaint  Patient presents with  . Fall    HPI Rhonda [name] is a 68 y.o. female.     Patient is a 68 year old female who presents with left elbow pain.  She was trying to jump over a baby gate and tripped having a B and E injury on her left arm.  She complains of pain primarily to her left elbow that radiates down to her left wrist.  There is no numbness or weakness in the hand.  She denies any head injury, loss of consciousness or other injuries from the fall.  She is not on anticoagulants.     Past Medical History:  Diagnosis Date  . Achilles tendinitis of right lower extremity 06/19/2014   US showed minor changes in December 15  Most sxs seem to be enthesopathy  Note had reaction to NTG in past  Overview:  Overview:  US showed minor changes in December 15  Most sxs seem to be enthesopathy  Note had reaction to NTG in past  Last Assessment & Plan:  Recent irritation in the race appears to be a partial plantaris tear  This continues to give her some Achilles symptoms  I think a period  . ALLERGIC RHINITIS 10/12/2006   Qualifier: Diagnosis of  By: McDiarmid MD, Sherren Mocha    . Ankle pain 08/17/2011  . Asthma 10/12/2006   Qualifier: Diagnosis of  By: McDiarmid MD, Sherren Mocha    Overview:  Overview:  Qualifier: Diagnosis of  By: McDiarmid MD, Sherren Mocha   . CARCINOMA, BASAL CELL 02/07/2008   Qualifier: History of  By: Drue Flirt  MD, Merrily Brittle    Overview:  Overview:  Qualifier: History of  By: Drue Flirt  MD, Merrily Brittle   Overview:  Qualifier: History of  By: Drue Flirt  MD, Merrily Brittle   . Chest pain in adult 08/17/2011   This has worrisome features particularly with her level of exertion   . HAMMER TOE, OTHER, ACQUIRED 10/13/2006   Qualifier: Diagnosis of  By: McDiarmid MD, Sherren Mocha    Overview:  Overview:  Qualifier: Diagnosis of  By: McDiarmid MD, Sherren Mocha   . High  cholesterol   . Hyperlipidemia LDL goal <130 10/24/2013  . Injury of plantaris muscle or tendon 10/30/2014  . Insomnia 11/13/2013  . Knee pain, left 06/15/2017  . LEG PAIN, RIGHT 09/22/2009   Qualifier: Diagnosis of  By: Oneida Alar MD, KARL    . Malignant neoplasm of female breast ([place]) 02/07/2008   Qualifier: History of  By: Drue Flirt  MD, Merrily Brittle    Overview:  Overview:  Qualifier: History of  By: Drue Flirt  MD, Merrily Brittle  Overview:  Overview:  Qualifier: History of  By: Drue Flirt  MD, Merrily Brittle   . Metatarsal stress fracture 08/29/2012   This is at the distal shaft of the fifth metatarsal left foot   . Mild intermittent asthma without complication [id number]  . Muscle tear 11/15/2012   Left rectus femoris; also like tendon tear   . Numbness and tingling of foot 03/29/2012  . Patellar tendinitis 03/07/2013   I suspect this occurred because she did less crosstraining before this marathon and was probably hamstring dominant   . Right foot pain 04/06/2016  . Right knee pain 04/23/2013   04/23/13  ultrasound today RT knee  revealed no significant effusion The quadriceps tendon is intact as is the patellar tendon The medial  meniscus was normal The lateral meniscus had an area of calcification and a very small area of splitting along the midline to the posterior third of the joint line   . Rotator cuff tendinitis, left 09/08/2016   Overview:  Last Assessment & Plan:  Significant improvement  See OV summary  . Sensorineural hearing loss 11/13/2013  . Stress fracture of pelvis 08/29/2007   Qualifier: History of  By: Drue Flirt  MD, Merrily Brittle    . STRESS FRACTURE, TIBIA 06/15/2009   Qualifier: Diagnosis of  By: Drue Flirt  MD, Merrily Brittle    . Thrombocytopenia ([place]) 01/30/2014  . Tibialis tendinitis 08/12/2009   Qualifier: Diagnosis of  By: Oneida Alar MD, KARL    . Tinnitus 11/13/2013  . TROCHANTERIC BURSITIS, LEFT 04/27/2010   Qualifier: Diagnosis of  By: Ernestina Patches MD, Remo Lipps    . UNEQUAL LEG LENGTH 09/18/2008   Qualifier: Diagnosis of   By: Oneida Alar MD, KARL      Patient Active Problem List   Diagnosis Date Noted  . Knee pain, left 06/15/2017  . Rotator cuff tendinitis, left 09/08/2016  . Right foot pain 04/06/2016  . Injury of plantaris muscle or tendon 10/30/2014  . Achilles tendinitis of right lower extremity 06/19/2014  . Thrombocytopenia ([place]) 01/30/2014  . Benign paroxysmal positional vertigo 11/13/2013  . Insomnia 11/13/2013  . Sensorineural hearing loss 11/13/2013  . Tinnitus 11/13/2013  . Hyperlipidemia LDL goal <130 10/24/2013  . Mild intermittent asthma without complication 99991111  . Right knee pain 04/23/2013  . Patellar tendinitis 03/07/2013  . Muscle tear 11/15/2012  . Metatarsal stress fracture 08/29/2012  . Numbness and tingling of foot 03/29/2012  . Ankle pain 08/17/2011  . Chest pain in adult 08/17/2011  . TROCHANTERIC BURSITIS, LEFT 04/27/2010  . LEG PAIN, RIGHT 09/22/2009  . TIBIALIS TENDINITIS 08/12/2009  . STRESS FRACTURE, TIBIA 06/15/2009  . UNEQUAL LEG LENGTH 09/18/2008  . CARCINOMA, BASAL CELL 02/07/2008  . Malignant neoplasm of female breast ([place]) 02/07/2008  . STRESS FRACTURE OF PELVIS 08/29/2007  . HAMMER TOE, OTHER, ACQUIRED 10/13/2006  . ALLERGIC RHINITIS 10/12/2006  . Asthma 10/12/2006  . Allergic rhinitis 10/12/2006    Past Surgical History:  Procedure Laterality Date  . ABDOMINAL SURGERY    . ABDOMINAL SURGERY     for removal of abdominal mass-cancerous  . KNEE SURGERY    . MANDIBLE FRACTURE SURGERY    . NASAL SINUS SURGERY    . TONSILLECTOMY       OB History   No obstetric history on file.      Home Medications    Prior to Admission medications   Medication Sig Start Date End Date Taking? Authorizing Provider  rosuvastatin (CRESTOR) 10 MG tablet Take by mouth. 01/30/19  Yes [provider]  albuterol (PROVENTIL HFA) 108 (90 BASE) MCG/ACT inhaler Inhale 2 puffs into the lungs every 6 (six) hours as needed for wheezing.    [provider]   omeprazole (PRILOSEC) 20 MG capsule Take 1 capsule by mouth as needed. 01/29/18   [provider]  rosuvastatin (CRESTOR) 10 MG tablet Take 10 mg by mouth daily.  01/30/14   [provider]    Family History Family History  Problem Relation Age of Onset  . COPD Mother   . Pulmonary fibrosis Father   . Parkinson's disease Father   . Diabetes Father   . Thyroid disease Sister   . Diabetes Sister   . Heart attack Neg Hx     Social History Social History  Tobacco Use  . Smoking status: Never Smoker  . Smokeless tobacco: Never Used  Substance Use Topics  . Alcohol use: Yes    Comment: occ glass of wine  . Drug use: No     Allergies   Acetaminophen, Aspirin, Nsaids, and Pravastatin   Review of Systems Review of Systems  Constitutional: Negative for fever.  Gastrointestinal: Negative for nausea and vomiting.  Musculoskeletal: Positive for arthralgias and joint swelling. Negative for back pain and neck pain.  Skin: Negative for wound.  Neurological: Negative for syncope, weakness, numbness and headaches.     Physical Exam Updated Vital Signs BP 116/68   Pulse 70   Temp 99.1 F (37.3 C)   Resp 16   Ht 5' (1.524 m)   Wt 59.4 kg   SpO2 100%   BMI 25.58 kg/m   Physical Exam Constitutional:      Appearance: She is well-developed.  HENT:     Head: Normocephalic and atraumatic.  Neck:     Musculoskeletal: Normal range of motion and neck supple.  Cardiovascular:     Rate and Rhythm: Normal rate.  Pulmonary:     Effort: Pulmonary effort is normal.  Musculoskeletal:        General: Tenderness present.     Comments: Positive tenderness over the lateral epicondyle of the left elbow.  There is some mild swelling to the area.  There is no pain to the forearm.  There is minimal pain on range of motion of the wrist.  There is no pain to the hand.  No pain in the shoulder.  She has normal motor function and sensation in the hand.  Radial pulses are  intact.  No open wounds are noted.  Skin:    General: Skin is warm and dry.  Neurological:     Mental Status: She is alert and oriented to person, place, and time.      ED Treatments / Results  Labs (all labs ordered are listed, but only abnormal results are displayed) Labs Reviewed - No data to display  EKG None  Radiology Dg Elbow Complete Left  Result Date: 04/16/2019 CLINICAL DATA:  Acute LEFT elbow pain following fall. Initial encounter. EXAM: LEFT ELBOW - COMPLETE 3+ VIEW COMPARISON:  None. FINDINGS: An elbow effusion is noted. No fracture is identified. No subluxation or dislocation. No focal bony lesions are present. IMPRESSION: Elbow effusion without visible fracture. Occult fracture is not excluded. Electronically Signed   By: Margarette Canada M.D.   On: 04/16/2019 19:38   Dg Wrist Complete Left  Result Date: 04/16/2019 CLINICAL DATA:  Acute LEFT wrist pain following fall. Initial encounter. EXAM: LEFT WRIST - COMPLETE 3+ VIEW COMPARISON:  None. FINDINGS: There is no evidence of fracture or dislocation. There is no evidence of arthropathy or other focal bone abnormality. Soft tissues are unremarkable. IMPRESSION: Negative. Electronically Signed   By: Margarette Canada M.D.   On: 04/16/2019 19:36    Procedures Procedures (including critical care time)  Medications Ordered in ED Medications - No data to display   Initial Impression / Assessment and Plan / ED Course  I have reviewed the triage vital signs and the nursing notes.  Pertinent labs & imaging results that were available during my care of the patient were reviewed by me and considered in my medical decision making (see chart for details).        No fractures are identified on imaging studies.  There is an elbow effusion which could represent  an occult fracture.  She was placed in a sling.  She was encouraged to follow-up with her sports medicine physician in 1 week for recheck.  Final Clinical Impressions(s) / ED  Diagnoses   Final diagnoses:  Elbow effusion, left    ED Discharge Orders    None       Malvin Johns, MD 04/16/19 2036

## 2019-04-16 NOTE — ED Notes (Signed)
Ice pack to left elbow

## 2019-04-18 ENCOUNTER — Ambulatory Visit: Payer: 59 | Admitting: Emergency Medicine

## 2019-04-23 ENCOUNTER — Ambulatory Visit: Payer: Self-pay

## 2019-04-23 ENCOUNTER — Ambulatory Visit: Payer: 59 | Admitting: Sports Medicine

## 2019-04-23 ENCOUNTER — Other Ambulatory Visit: Payer: Self-pay

## 2019-04-23 VITALS — BP 142/74 | Ht 60.0 in | Wt 131.0 lb

## 2019-04-23 DIAGNOSIS — M25532 Pain in left wrist: Secondary | ICD-10-CM

## 2019-04-23 DIAGNOSIS — M25531 Pain in right wrist: Secondary | ICD-10-CM

## 2019-04-23 HISTORY — DX: Pain in left wrist: M25.532

## 2019-04-23 NOTE — Assessment & Plan Note (Signed)
Injury as noted Fitted for wrist splint to block motion Needs to use this for 3 weeks and then gradually start using wrist  Healing likely in 6 weeks Ice or OTC meds as needed

## 2019-04-23 NOTE — Progress Notes (Signed)
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 [employer] Has been putting in very long hours Poor sleep pattern  Past hx of Left schaphoid fracture 50 years ago  ROS No numbness in hand No pain with movement of thumb Not really tender over snuff box area  PE Pleasant older F in NAD BP (!) 142/74   Ht 5' (1.524 m)   Wt 131 lb (59.4 kg)   BMI 25.58 kg/m   Full motion of left wrist  Mild pain on extension of wrist TTP is primarily over lister's tubercle and mid wrist on dorsum Full movement of all fingers Grip ok but feels pain Snuff box is non tender  Ultrasound of dorsum of left wrist  Compartment 1 and 2 normal Compartments 3 normal Lister's tubercle shows a small fragment off dorsum This is visualized in short and long axis Hypoechoic change proximal to this Compartment 4 shows some increased hypoechoic change Compartment 5 and 6 normal Radio carpal joint without effusion  Impression: minor fracture off tip of Lister's tubercle and likely compartment 4 sprain with increased fluid  Ultrasound and interpretation by Wolfgang Phoenix. Oneida Alar, MD

## 2019-04-24 DIAGNOSIS — Z23 Encounter for immunization: Secondary | ICD-10-CM | POA: Diagnosis not present

## 2019-04-24 DIAGNOSIS — B078 Other viral warts: Secondary | ICD-10-CM | POA: Diagnosis not present

## 2019-05-07 ENCOUNTER — Encounter: Payer: Self-pay | Admitting: Emergency Medicine

## 2019-05-07 ENCOUNTER — Ambulatory Visit: Payer: 59 | Admitting: Emergency Medicine

## 2019-05-07 ENCOUNTER — Other Ambulatory Visit: Payer: Self-pay

## 2019-05-07 DIAGNOSIS — K219 Gastro-esophageal reflux disease without esophagitis: Secondary | ICD-10-CM

## 2019-05-07 DIAGNOSIS — J4520 Mild intermittent asthma, uncomplicated: Secondary | ICD-10-CM

## 2019-05-07 DIAGNOSIS — J301 Allergic rhinitis due to pollen: Secondary | ICD-10-CM | POA: Diagnosis not present

## 2019-05-07 NOTE — Assessment & Plan Note (Signed)
  You could restart an antihistamine if you begin to have breakthrough allergic rhinitis symptoms.

## 2019-05-07 NOTE — Patient Instructions (Signed)
Keep your albuterol available to use 2 puffs if needed for shortness of breath, chest tightness, wheezing.  Once your exercise regimen has increased you will probably need to start pretreating your exercise with 2 puffs. Use omeprazole if needed for flares of esophageal reflux. You could restart an antihistamine if you begin to have breakthrough allergic rhinitis symptoms. Flu shot is up-to-date You would benefit from getting the COVID-19 vaccine but becomes available Follow with Dr Lamonte Sakai in 6 months or sooner if you have any problems

## 2019-05-07 NOTE — Assessment & Plan Note (Signed)
Keep your albuterol available to use 2 puffs if needed for shortness of breath, chest tightness, wheezing.  Once your exercise regimen has increased you will probably need to start pretreating your exercise with 2 puffs.e You would benefit from getting the COVID-19 vaccine but becomes available Follow with Dr Lamonte Sakai in 6 months or sooner if you have any problems

## 2019-05-07 NOTE — Progress Notes (Signed)
   Subjective:    Patient ID: Rhonda [name], female    DOB: [birth year], 68 y.o.   MRN: [id number]  Asthma She complains of shortness of breath. There is no cough or wheezing. Pertinent negatives include no ear pain, fever, headaches, postnasal drip, rhinorrhea, sneezing, sore throat or trouble swallowing. Her past medical history is significant for asthma.     ROV 05/07/2019 --follow-up visit for pleasant 68 year old woman with a history of mild asthma and chronic upper airway irritation with cough.  There is an exercise-induced component to her airflow obstruction and she has used albuterol successfully prior to exercise.  She has been treated for GERD with Prilosec which she uses prn, not currently on any rhinitis therapy.  She has albuterol which she has not required at all recently. She had started training to run again but had trouble doing so due to L broken wrist.    Review of Systems  Constitutional: Negative for fever and unexpected weight change.  HENT: Negative for congestion, dental problem, ear pain, nosebleeds, postnasal drip, rhinorrhea, sinus pressure, sneezing, sore throat and trouble swallowing.   Eyes: Negative for redness and itching.  Respiratory: Positive for chest tightness and shortness of breath. Negative for cough and wheezing.   Cardiovascular: Negative for palpitations and leg swelling.  Gastrointestinal: Negative for nausea and vomiting.  Genitourinary: Negative for dysuria.  Musculoskeletal: Negative for joint swelling.  Skin: Negative for rash.  Neurological: Negative for headaches.  Hematological: Does not bruise/bleed easily.  Psychiatric/Behavioral: Negative for dysphoric mood. The patient is not nervous/anxious.        Objective:   Physical Exam Vitals:   05/07/19 1546  BP: 118/76  Pulse: 64  Temp: (!) 97 F (36.1 C)  TempSrc: Temporal  SpO2: 100%  Weight: 131 lb 3.2 oz (59.5 kg)  Height: 5' (1.524 m)   Gen: Pleasant, well-nourished, in no  distress,  normal affect  ENT: No lesions,  mouth clear,  oropharynx clear, no postnasal drip  Neck: No JVD, no stridor  Lungs: No use of accessory muscles, no dullness to percussion, clear without rales or rhonchi  Cardiovascular: RRR, heart sounds normal, no murmur or gallops, no peripheral edema  Musculoskeletal: No deformities, no cyanosis or clubbing  Neuro: alert, non focal  Skin: Warm, no lesions or rash      Assessment & Plan:  Mild intermittent asthma without complication Keep your albuterol available to use 2 puffs if needed for shortness of breath, chest tightness, wheezing.  Once your exercise regimen has increased you will probably need to start pretreating your exercise with 2 puffs.e You would benefit from getting the COVID-19 vaccine but becomes available Follow with Dr Lamonte Sakai in 6 months or sooner if you have any problems  Allergic rhinitis  You could restart an antihistamine if you begin to have breakthrough allergic rhinitis symptoms.  GERD (gastroesophageal reflux disease) Use omeprazole if needed for flares of esophageal reflux.  Baltazar Apo, MD, PhD 05/07/2019, 4:09 PM [facility] [phone] or if no answer [phone]

## 2019-05-07 NOTE — Assessment & Plan Note (Signed)
Use omeprazole if needed for flares of esophageal reflux.

## 2019-05-09 ENCOUNTER — Ambulatory Visit
Admission: RE | Admit: 2019-05-09 | Discharge: 2019-05-09 | Disposition: A | Discharge status: Home / Self Care | Payer: 59 | Source: Ambulatory Visit | Attending: Obstetrics and Gynecology | Admitting: Obstetrics and Gynecology

## 2019-05-09 ENCOUNTER — Other Ambulatory Visit: Payer: Self-pay

## 2019-05-09 DIAGNOSIS — Z1231 Encounter for screening mammogram for malignant neoplasm of breast: Secondary | ICD-10-CM

## 2019-05-14 ENCOUNTER — Ambulatory Visit: Payer: 59 | Admitting: Sports Medicine

## 2019-05-14 ENCOUNTER — Encounter: Payer: Self-pay | Admitting: Sports Medicine

## 2019-05-14 ENCOUNTER — Other Ambulatory Visit: Payer: Self-pay

## 2019-05-14 VITALS — BP 120/74 | Ht 60.0 in | Wt 131.2 lb

## 2019-05-14 DIAGNOSIS — M25532 Pain in left wrist: Secondary | ICD-10-CM | POA: Diagnosis not present

## 2019-05-14 NOTE — Progress Notes (Signed)
   [facility] [address] [place], [place] [address] Phone: [phone] Fax: [phone]   Patient Name: Rhonda [name] Date of Birth: [birth year] Medical Record Number: [id number] Gender: female Date of Encounter: 05/14/2019  SUBJECTIVE:      Chief Complaint:  Left wrist pain   HPI:  Rhonda [name] is following up today for chip fracture at Lister's tubercle.  She has been diligently wearing her brace.  She is having pain along the thumb.  Aggravating factors include lifting something in that hand.  She feels her range of motion is improved, but still lacking.  She has pain when opening a jar.  He is able to perform the activities she needs to.  There has been no new injury.  She denies any swelling, numbness, tingling, weakness, or skin changes.   ROS:     See HPI.   PERTINENT  PMH / PSH / FH / SH:  Past Medical, Surgical, Social, and Family History Reviewed & Updated in the EMR.   OBJECTIVE:  BP 120/74   Ht 5' (1.524 m)   Wt 131 lb 3.2 oz (59.5 kg)   BMI 25.62 kg/m  Physical Exam:  Vital signs are reviewed.   GEN: Alert and oriented, NAD Pulm: Breathing unlabored PSY: normal mood, congruent affect  MSK: Right wrist No swelling or erythema Forearm sensation intact  ROM: Flexion, extension, radial and ulnar deviation full active and passive Full strength at wrist and fingers Mild TTP over Lister's tubercle and first dorsal compartment Negative tinel tap  Negative froment's NVI  Ultrasound of dorsum of left wrist Compartment 1 and 2 normal  compartment 3 normal Very small fragment of Lister's tubercle, improved since last visit Compartment 4, 5, 6 normal  Impression: Healing fracture of tip of Lister's tubercle/ no swelling noted today.     ASSESSMENT & PLAN:   1. Left listers tubercle avulsion fracture  Given the improvement both clinically and objectively, she can slowly be out of her brace more often during the day.  We will  start home exercise program focusing on grip strength and range of motion.  If she does not feel back to normal in the next 3-4 weeks, please come back for a follow-up appointment.  Lanier Clam, DO, ATC Sports Medicine Fellow  I observed and examined the patient with Dr. Kathrynn Speed and agree with assessment and plan.  Note reviewed and modified by me.  I spent 25 minutes with this patient. Over 50% of visit was spend in counseling and coordination of care for problems with wrist function and recovery. Ila Mcgill, MD

## 2019-05-24 DIAGNOSIS — Z13820 Encounter for screening for osteoporosis: Secondary | ICD-10-CM | POA: Diagnosis not present

## 2019-05-27 DIAGNOSIS — Z01419 Encounter for gynecological examination (general) (routine) without abnormal findings: Secondary | ICD-10-CM | POA: Diagnosis not present

## 2019-05-27 DIAGNOSIS — Z6826 Body mass index (BMI) 26.0-26.9, adult: Secondary | ICD-10-CM | POA: Diagnosis not present

## 2019-05-28 DIAGNOSIS — B078 Other viral warts: Secondary | ICD-10-CM | POA: Diagnosis not present

## 2019-05-28 DIAGNOSIS — Z23 Encounter for immunization: Secondary | ICD-10-CM | POA: Diagnosis not present

## 2019-05-31 MED FILL — ROSUVASTATIN CALCIUM 10 MG: 10 | 90 days supply | Qty: 90 | Fill #1

## 2019-06-04 ENCOUNTER — Ambulatory Visit: Payer: 59 | Admitting: Sports Medicine

## 2019-06-26 DIAGNOSIS — B078 Other viral warts: Secondary | ICD-10-CM | POA: Diagnosis not present

## 2019-07-22 DIAGNOSIS — E785 Hyperlipidemia, unspecified: Secondary | ICD-10-CM | POA: Diagnosis not present

## 2019-07-22 DIAGNOSIS — R7309 Other abnormal glucose: Secondary | ICD-10-CM | POA: Diagnosis not present

## 2019-07-24 ENCOUNTER — Ambulatory Visit: Payer: 59 | Admitting: Family Medicine

## 2019-07-24 ENCOUNTER — Other Ambulatory Visit: Payer: Self-pay

## 2019-07-24 VITALS — BP 122/76 | Ht 60.0 in | Wt 136.6 lb

## 2019-07-24 DIAGNOSIS — N904 Leukoplakia of vulva: Secondary | ICD-10-CM

## 2019-07-24 DIAGNOSIS — K219 Gastro-esophageal reflux disease without esophagitis: Secondary | ICD-10-CM | POA: Diagnosis not present

## 2019-07-24 DIAGNOSIS — J4520 Mild intermittent asthma, uncomplicated: Secondary | ICD-10-CM | POA: Diagnosis not present

## 2019-07-24 DIAGNOSIS — M25552 Pain in left hip: Secondary | ICD-10-CM

## 2019-07-24 DIAGNOSIS — E785 Hyperlipidemia, unspecified: Secondary | ICD-10-CM | POA: Diagnosis not present

## 2019-07-24 HISTORY — DX: Leukoplakia of vulva: N90.4

## 2019-07-24 NOTE — Patient Instructions (Addendum)
You have IT band syndrome and are getting spasms of your tensor fascia lata Ice over area of pain 3-4 times a day for 15 minutes at a time Do home exercises and stretches daily Stretches - pick 2-3 and hold for 20-30 seconds x 3 - do once or twice a day. Tylenol and/or aleve as needed for pain - noted patient cannot take nsaids; tylenol, topical medications if needed Consider physical therapy, trochanteric bursa injection, prednisone dose pack if not improving. Try to avoid hills, stairs when possible. Follow up with me in 4 weeks but let me know sooner if you aren't improving.

## 2019-07-26 ENCOUNTER — Encounter: Payer: Self-pay | Admitting: Family Medicine

## 2019-07-26 NOTE — Progress Notes (Signed)
PCP: Loraine Leriche., MD  Subjective:   HPI: Patient is a 69 y.o. female here for left hip pain.  Patient reports for about 4 weeks she's had lateral left hip pain without acute injury. She's currently training for a marathon. Felt like the night after a run 4 weeks ago that lateral hip 'seized up' on her. She thinks it may be because she didn't do a stretch and warm-up that day as she usually does. Current pain loosens up with walking. Has been waking her up though every 1-2 hours. Doing hip external rotation exercises but not seeming to help her much. No back pain, radiation down past knee, numbness/tingling. No right hip pain.  Past Medical History:  Diagnosis Date  . Achilles tendinitis of right lower extremity 06/19/2014   US showed minor changes in December 15  Most sxs seem to be enthesopathy  Note had reaction to NTG in past  Overview:  Overview:  US showed minor changes in December 15  Most sxs seem to be enthesopathy  Note had reaction to NTG in past  Last Assessment & Plan:  Recent irritation in the race appears to be a partial plantaris tear  This continues to give her some Achilles symptoms  I think a period  . ALLERGIC RHINITIS 10/12/2006   Qualifier: Diagnosis of  By: McDiarmid MD, Sherren Mocha    . Ankle pain 08/17/2011  . Asthma 10/12/2006   Qualifier: Diagnosis of  By: McDiarmid MD, Sherren Mocha    Overview:  Overview:  Qualifier: Diagnosis of  By: McDiarmid MD, Sherren Mocha   . CARCINOMA, BASAL CELL 02/07/2008   Qualifier: History of  By: Drue Flirt  MD, Merrily Brittle    Overview:  Overview:  Qualifier: History of  By: Drue Flirt  MD, Merrily Brittle   Overview:  Qualifier: History of  By: Drue Flirt  MD, Merrily Brittle   . Chest pain in adult 08/17/2011   This has worrisome features particularly with her level of exertion   . HAMMER TOE, OTHER, ACQUIRED 10/13/2006   Qualifier: Diagnosis of  By: McDiarmid MD, Sherren Mocha    Overview:  Overview:  Qualifier: Diagnosis of  By: McDiarmid MD, Sherren Mocha   . High cholesterol   .  Hyperlipidemia LDL goal <130 10/24/2013  . Injury of plantaris muscle or tendon 10/30/2014  . Insomnia 11/13/2013  . Knee pain, left 06/15/2017  . LEG PAIN, RIGHT 09/22/2009   Qualifier: Diagnosis of  By: Oneida Alar MD, KARL    . Malignant neoplasm of female breast ([place]) 02/07/2008   Qualifier: History of  By: Drue Flirt  MD, Merrily Brittle    Overview:  Overview:  Qualifier: History of  By: Drue Flirt  MD, Merrily Brittle  Overview:  Overview:  Qualifier: History of  By: Drue Flirt  MD, Merrily Brittle   . Metatarsal stress fracture 08/29/2012   This is at the distal shaft of the fifth metatarsal left foot   . Mild intermittent asthma without complication 123456  . Muscle tear 11/15/2012   Left rectus femoris; also like tendon tear   . Numbness and tingling of foot 03/29/2012  . Patellar tendinitis 03/07/2013   I suspect this occurred because she did less crosstraining before this marathon and was probably hamstring dominant   . Right foot pain 04/06/2016  . Right knee pain 04/23/2013   04/23/13  ultrasound today RT knee  revealed no significant effusion The quadriceps tendon is intact as is the patellar tendon The medial meniscus was normal The lateral meniscus had an area of calcification and a very small area  of splitting along the midline to the posterior third of the joint line   . Rotator cuff tendinitis, left 09/08/2016   Overview:  Last Assessment & Plan:  Significant improvement  See OV summary  . Sensorineural hearing loss 11/13/2013  . Stress fracture of pelvis 08/29/2007   Qualifier: History of  By: Drue Flirt  MD, Merrily Brittle    . STRESS FRACTURE, TIBIA 06/15/2009   Qualifier: Diagnosis of  By: Drue Flirt  MD, Merrily Brittle    . Thrombocytopenia ([place]) 01/30/2014  . Tibialis tendinitis 08/12/2009   Qualifier: Diagnosis of  By: Oneida Alar MD, KARL    . Tinnitus 11/13/2013  . TROCHANTERIC BURSITIS, LEFT 04/27/2010   Qualifier: Diagnosis of  By: Ernestina Patches MD, Remo Lipps    . UNEQUAL LEG LENGTH 09/18/2008   Qualifier: Diagnosis of  By: Oneida Alar MD, KARL       Current Outpatient Medications on File Prior to Visit  Medication Sig Dispense Refill  . albuterol (PROVENTIL HFA) 108 (90 BASE) MCG/ACT inhaler Inhale 2 puffs into the lungs every 6 (six) hours as needed for wheezing.    . rosuvastatin (CRESTOR) 10 MG tablet Take by mouth.    Marland Kitchen omeprazole (PRILOSEC) 20 MG capsule Take 1 capsule by mouth as needed.     No current facility-administered medications on file prior to visit.    Past Surgical History:  Procedure Laterality Date  . ABDOMINAL SURGERY    . ABDOMINAL SURGERY     for removal of abdominal mass-cancerous  . KNEE SURGERY    . MANDIBLE FRACTURE SURGERY    . NASAL SINUS SURGERY    . TONSILLECTOMY      Allergies  Allergen Reactions  . Acetaminophen     REACTION: mild throat swelling if takes more than a few consecutive doses  . Aspirin     REACTION: anaphylaxis  . Nsaids     REACTION: face \\T \throat swelling, sneezing and difficulty breathing  . Pravastatin Other (See Comments)    "joint pains"    Social History   Socioeconomic History  . Marital status: Married    Spouse name: Not on file  . Number of children: Not on file  . Years of education: Not on file  . Highest education level: Not on file  Occupational History  . Not on file  Tobacco Use  . Smoking status: Never Smoker  . Smokeless tobacco: Never Used  Substance and Sexual Activity  . Alcohol use: Yes    Comment: occ glass of wine  . Drug use: No  . Sexual activity: Not on file  Other Topics Concern  . Not on file  Social History Narrative  . Not on file   Social Determinants of Health   Financial Resource Strain:   . Difficulty of Paying Living Expenses:   Food Insecurity:   . Worried About Charity fundraiser in the Last Year:   . Arboriculturist in the Last Year:   Transportation Needs:   . Film/video editor (Medical):   Marland Kitchen Lack of Transportation (Non-Medical):   Physical Activity:   . Days of Exercise per Week:   . Minutes of  Exercise per Session:   Stress:   . Feeling of Stress :   Social Connections:   . Frequency of Communication with Friends and Family:   . Frequency of Social Gatherings with Friends and Family:   . Attends Religious Services:   . Active Member of Clubs or Organizations:   . Attends Archivist  Meetings:   Marland Kitchen Marital Status:   Intimate Partner Violence:   . Fear of Current or Ex-Partner:   . Emotionally Abused:   Marland Kitchen Physically Abused:   . Sexually Abused:     Family History  Problem Relation Age of Onset  . COPD Mother   . Pulmonary fibrosis Father   . Parkinson's disease Father   . Diabetes Father   . Thyroid disease Sister   . Diabetes Sister   . Heart attack Neg Hx     BP 122/76   Ht 5' (1.524 m)   Wt 136 lb 9.6 oz (62 kg)   BMI 26.68 kg/m   Review of Systems: See HPI above.     Objective:  Physical Exam:  Gen: NAD, comfortable in exam room  Back: No gross deformity, scoliosis. No TTP .  No midline or bony TTP. FROM without pain. Strength LEs 5/5 all muscle groups except 5-/5 hip abduction.   Negative SLRs. Sensation intact to light touch bilaterally.  Left hip: No deformity. FROM with 5/5 strength except 5-/5 hip abduction. TTP IT band, greater trochanter, just proximal to greater trochanter. NVI distally. Negative logroll  Negative fabers and piriformis stretches. Mild tightness with ober's   Assessment & Plan:  1. Left hip pain - 2/2 proximal IT band syndrome though also describing spasms of TFL.  Icing, home exercises and stretches reviewed.  Declined steroid injection, physical therapy at this time.  She could take some tylenol but cannot take nsaids - could use topical medications.  Avoid hills, stairs when possible.  F/u in 4 weeks.

## 2019-08-07 ENCOUNTER — Other Ambulatory Visit: Payer: Self-pay

## 2019-08-07 ENCOUNTER — Encounter: Payer: Self-pay | Admitting: Family Medicine

## 2019-08-07 ENCOUNTER — Ambulatory Visit: Payer: 59 | Admitting: Family Medicine

## 2019-08-07 VITALS — BP 138/65 | Ht 60.0 in | Wt 136.0 lb

## 2019-08-07 DIAGNOSIS — M25552 Pain in left hip: Secondary | ICD-10-CM

## 2019-08-07 NOTE — Patient Instructions (Signed)
Start physical therapy to work on your tensor fascia lata, glut medius, glut minimus including modalities and probable iontophoresis. Do home exercises and stretches daily - they will give you additional ones to do from PT. Tylenol, topical medications if needed. Ice over area of pain 3-4 times a day for 15 minutes at a time Consider injection, prednisone dose pack if not improving. Try to avoid hills, stairs when possible. Follow up with me in 3 weeks.

## 2019-08-07 NOTE — Progress Notes (Signed)
CC: Left hip pain  HPI:  Rhonda [name] is a 69 y.o.  with a PMH listed below presenting for left hip pain.  She was last seen on 07/24/2019 for her hip pain thought to be 2/2 proximal IT band syndrome or spasms of her TFL. Advised to do icing, home exercises and stretching, declined steroid injection at that time. Today she reports that her left hip pain has improved still having a lot of difficulty with walking upstairs or running.  She still having left hip pain with no radiation.  She tried running about 2 times since her last appointment, it was very painful and she found herself favoring her right leg when she ran, she had to do it in intervals.  She has been doing the icing but reports that this has not helped.  She reports that massages and foam rolling really helps.  She has been doing most of the exercises except one which caused her to have a lot of popping and cracking sounds in her hip. Denies any back pain, radiation, or numbness/tingling. She reports that she has not been able to train for the marathon and has had to cancel her marathon in May.  She wants to go back to her normal self.  Please see A&P for status of the patient's chronic medical conditions  Past Medical History:  Diagnosis Date  . Achilles tendinitis of right lower extremity 06/19/2014   US showed minor changes in December 15  Most sxs seem to be enthesopathy  Note had reaction to NTG in past  Overview:  Overview:  US showed minor changes in December 15  Most sxs seem to be enthesopathy  Note had reaction to NTG in past  Last Assessment & Plan:  Recent irritation in the race appears to be a partial plantaris tear  This continues to give her some Achilles symptoms  I think a period  . ALLERGIC RHINITIS 10/12/2006   Qualifier: Diagnosis of  By: McDiarmid MD, Sherren Mocha    . Ankle pain 08/17/2011  . Asthma 10/12/2006   Qualifier: Diagnosis of  By: McDiarmid MD, Sherren Mocha    Overview:  Overview:  Qualifier: Diagnosis of  By:  McDiarmid MD, Sherren Mocha   . CARCINOMA, BASAL CELL 02/07/2008   Qualifier: History of  By: Drue Flirt  MD, Merrily Brittle    Overview:  Overview:  Qualifier: History of  By: Drue Flirt  MD, Merrily Brittle   Overview:  Qualifier: History of  By: Drue Flirt  MD, Merrily Brittle   . Chest pain in adult 08/17/2011   This has worrisome features particularly with her level of exertion   . HAMMER TOE, OTHER, ACQUIRED 10/13/2006   Qualifier: Diagnosis of  By: McDiarmid MD, Sherren Mocha    Overview:  Overview:  Qualifier: Diagnosis of  By: McDiarmid MD, Sherren Mocha   . High cholesterol   . Hyperlipidemia LDL goal <130 10/24/2013  . Injury of plantaris muscle or tendon 10/30/2014  . Insomnia 11/13/2013  . Knee pain, left 06/15/2017  . LEG PAIN, RIGHT 09/22/2009   Qualifier: Diagnosis of  By: Oneida Alar MD, KARL    . Malignant neoplasm of female breast ([place]) 02/07/2008   Qualifier: History of  By: Drue Flirt  MD, Merrily Brittle    Overview:  Overview:  Qualifier: History of  By: Drue Flirt  MD, Merrily Brittle  Overview:  Overview:  Qualifier: History of  By: Drue Flirt  MD, Merrily Brittle   . Metatarsal stress fracture 08/29/2012   This is at the distal shaft of the fifth metatarsal left  foot   . Mild intermittent asthma without complication 123456  . Muscle tear 11/15/2012   Left rectus femoris; also like tendon tear   . Numbness and tingling of foot 03/29/2012  . Patellar tendinitis 03/07/2013   I suspect this occurred because she did less crosstraining before this marathon and was probably hamstring dominant   . Right foot pain 04/06/2016  . Right knee pain 04/23/2013   04/23/13  ultrasound today RT knee  revealed no significant effusion The quadriceps tendon is intact as is the patellar tendon The medial meniscus was normal The lateral meniscus had an area of calcification and a very small area of splitting along the midline to the posterior third of the joint line   . Rotator cuff tendinitis, left 09/08/2016   Overview:  Last Assessment & Plan:  Significant improvement  See OV summary    . Sensorineural hearing loss 11/13/2013  . Stress fracture of pelvis 08/29/2007   Qualifier: History of  By: Drue Flirt  MD, Merrily Brittle    . STRESS FRACTURE, TIBIA 06/15/2009   Qualifier: Diagnosis of  By: Drue Flirt  MD, Merrily Brittle    . Thrombocytopenia ([place]) 01/30/2014  . Tibialis tendinitis 08/12/2009   Qualifier: Diagnosis of  By: Oneida Alar MD, KARL    . Tinnitus 11/13/2013  . TROCHANTERIC BURSITIS, LEFT 04/27/2010   Qualifier: Diagnosis of  By: Ernestina Patches MD, Remo Lipps    . UNEQUAL LEG LENGTH 09/18/2008   Qualifier: Diagnosis of  By: Oneida Alar MD, [place]     Review of Systems: Refer to history of present illness and assessment and plans for pertinent review of systems, all others reviewed and negative.  Physical Exam:  Vitals:   08/07/19 1051  BP: 138/65  Weight: 136 lb (61.7 kg)  Height: 5' (1.524 m)   Physical Exam  Constitutional: She is oriented to person, place, and time and well-developed, well-nourished, and in no distress.  Cardiovascular: Normal rate, regular rhythm and normal heart sounds.  Pulmonary/Chest: Effort normal and breath sounds normal. No respiratory distress.  Neurological: She is alert and oriented to person, place, and time.  MSK: Left hip: Inspection: No erythema, edema, or warmth noted.  No obvious deformities.  Strength was 5/5 in extension, flexion.  Strength 4/5 and painful with abduction. Tender to palpation over proximal IT band and minimally over greater trochanter. Normal sensation and reflexes. Negative Fabers, fadir and piriformis stretches.  NVI distally.  Social History   Socioeconomic History  . Marital status: Married    Spouse name: Not on file  . Number of children: Not on file  . Years of education: Not on file  . Highest education level: Not on file  Occupational History  . Not on file  Tobacco Use  . Smoking status: Never Smoker  . Smokeless tobacco: Never Used  Substance and Sexual Activity  . Alcohol use: Yes    Comment: occ glass of wine  . Drug use:  No  . Sexual activity: Not on file  Other Topics Concern  . Not on file  Social History Narrative  . Not on file   Social Determinants of Health   Financial Resource Strain:   . Difficulty of Paying Living Expenses:   Food Insecurity:   . Worried About Charity fundraiser in the Last Year:   . Arboriculturist in the Last Year:   Transportation Needs:   . Film/video editor (Medical):   Marland Kitchen Lack of Transportation (Non-Medical):   Physical Activity:   .  Days of Exercise per Week:   . Minutes of Exercise per Session:   Stress:   . Feeling of Stress :   Social Connections:   . Frequency of Communication with Friends and Family:   . Frequency of Social Gatherings with Friends and Family:   . Attends Religious Services:   . Active Member of Clubs or Organizations:   . Attends Archivist Meetings:   Marland Kitchen Marital Status:   Intimate Partner Violence:   . Fear of Current or Ex-Partner:   . Emotionally Abused:   Marland Kitchen Physically Abused:   . Sexually Abused:    Family History  Problem Relation Age of Onset  . COPD Mother   . Pulmonary fibrosis Father   . Parkinson's disease Father   . Diabetes Father   . Thyroid disease Sister   . Diabetes Sister   . Heart attack Neg Hx     Assessment & Plan:   1. Left hip pain: Seems to be consistent with proximal IT pain syndrome and weakness of glut medius/minimus.  Patient has been doing home exercises, stretches, massages, and foam rolling which she reports helps.  Icing the area has not helped.  Reports improvement however continues to have difficulty with running and walking upstairs.  She continues to decline any steroid injection.  Discussed that this continues to be consistent with IT band syndrome, or muscle strains in TFL, gluteus minimus or gluteus medius.  Discussed that she would likely benefit from formal physical therapy to help with the strengthening and modalities.  Advised that it may take awhile for her to have  improvement. Will follow up in 3 weeks to evaluate.   Patient seen and discussed with Dr. Barbaraann Barthel

## 2019-08-12 ENCOUNTER — Ambulatory Visit: Payer: 59 | Attending: Family Medicine | Admitting: Physical Therapy

## 2019-08-12 ENCOUNTER — Other Ambulatory Visit: Payer: Self-pay

## 2019-08-12 DIAGNOSIS — M6281 Muscle weakness (generalized): Secondary | ICD-10-CM | POA: Diagnosis not present

## 2019-08-12 DIAGNOSIS — R29898 Other symptoms and signs involving the musculoskeletal system: Secondary | ICD-10-CM | POA: Diagnosis not present

## 2019-08-12 DIAGNOSIS — M25552 Pain in left hip: Secondary | ICD-10-CM | POA: Diagnosis not present

## 2019-08-12 DIAGNOSIS — S335XXA Sprain of ligaments of lumbar spine, initial encounter: Secondary | ICD-10-CM | POA: Diagnosis not present

## 2019-08-12 DIAGNOSIS — R262 Difficulty in walking, not elsewhere classified: Secondary | ICD-10-CM

## 2019-08-12 DIAGNOSIS — M79671 Pain in right foot: Secondary | ICD-10-CM | POA: Diagnosis not present

## 2019-08-12 DIAGNOSIS — M62838 Other muscle spasm: Secondary | ICD-10-CM | POA: Diagnosis not present

## 2019-08-12 NOTE — Patient Instructions (Addendum)
    Home exercise program created by Joahan Swatzell, PT.  For questions, please contact Khyra Viscuso via phone at [phone] or email at [email or web address]  [facility] [place] [address]  [address] [place], [place], [address] Phone: [phone]   Fax:  [phone]    

## 2019-08-12 NOTE — Therapy (Signed)
[facility] [address]  [name], [place], [address] Phone: [phone]   Fax:  [phone]  Physical Therapy Evaluation  Patient Details  Name: Rhonda [name] MRN: [id number] Date of Birth: 1951-02-15 Referring Provider (PT): Karlton Lemon, MD   Encounter Date: 08/12/2019  PT End of Session - 08/12/19 0804    Visit Number  1    Number of Visits  12    Date for PT Re-Evaluation  09/23/19    Authorization Type  [facility]    PT Start Time  0804    PT Stop Time  0901    PT Time Calculation (min)  57 min    Activity Tolerance  Patient tolerated treatment well    Behavior During Therapy  [facility] for tasks assessed/performed       Past Medical History:  Diagnosis Date  . Achilles tendinitis of right lower extremity 06/19/2014   US showed minor changes in December 15  Most sxs seem to be enthesopathy  Note had reaction to NTG in past  Overview:  Overview:  US showed minor changes in December 15  Most sxs seem to be enthesopathy  Note had reaction to NTG in past  Last Assessment & Plan:  Recent irritation in the race appears to be a partial plantaris tear  This continues to give her some Achilles symptoms  I think a period  . ALLERGIC RHINITIS 10/12/2006   Qualifier: Diagnosis of  By: McDiarmid MD, Sherren Mocha    . Ankle pain 08/17/2011  . Asthma 10/12/2006   Qualifier: Diagnosis of  By: McDiarmid MD, Sherren Mocha    Overview:  Overview:  Qualifier: Diagnosis of  By: McDiarmid MD, Sherren Mocha   . CARCINOMA, BASAL CELL 02/07/2008   Qualifier: History of  By: Drue Flirt  MD, Merrily Brittle    Overview:  Overview:  Qualifier: History of  By: Drue Flirt  MD, Merrily Brittle   Overview:  Qualifier: History of  By: Drue Flirt  MD, Merrily Brittle   . Chest pain in adult 08/17/2011   This has worrisome features particularly with her level of exertion   . HAMMER TOE, OTHER, ACQUIRED 10/13/2006   Qualifier: Diagnosis of  By: McDiarmid MD, Sherren Mocha    Overview:  Overview:  Qualifier: Diagnosis of   By: McDiarmid MD, Sherren Mocha   . High cholesterol   . Hyperlipidemia LDL goal <130 10/24/2013  . Injury of plantaris muscle or tendon 10/30/2014  . Insomnia 11/13/2013  . Knee pain, left 06/15/2017  . LEG PAIN, RIGHT 09/22/2009   Qualifier: Diagnosis of  By: [facility] Alar MD, KARL    . Malignant neoplasm of female breast ([place]) 02/07/2008   Qualifier: History of  By: Drue Flirt  MD, Merrily Brittle    Overview:  Overview:  Qualifier: History of  By: Drue Flirt  MD, Merrily Brittle  Overview:  Overview:  Qualifier: History of  By: Drue Flirt  MD, Merrily Brittle   . Metatarsal stress fracture 08/29/2012   This is at the distal shaft of the fifth metatarsal left foot   . Mild intermittent asthma without complication [id number]  . Muscle tear 11/15/2012   Left rectus femoris; also like tendon tear   . Numbness and tingling of foot 03/29/2012  . Patellar tendinitis 03/07/2013   I suspect this occurred because she did less crosstraining before this marathon and was probably hamstring dominant   . Right foot pain 04/06/2016  . Right knee pain 04/23/2013   04/23/13  ultrasound today RT knee  revealed no significant effusion The  quadriceps tendon is intact as is the patellar tendon The medial meniscus was normal The lateral meniscus had an area of calcification and a very small area of splitting along the midline to the posterior third of the joint line   . Rotator cuff tendinitis, left 09/08/2016   Overview:  Last Assessment & Plan:  Significant improvement  See OV summary  . Sensorineural hearing loss 11/13/2013  . Stress fracture of pelvis 08/29/2007   Qualifier: History of  By: Drue Flirt  MD, Merrily Brittle    . STRESS FRACTURE, TIBIA 06/15/2009   Qualifier: Diagnosis of  By: Drue Flirt  MD, Merrily Brittle    . Thrombocytopenia ([place]) 01/30/2014  . Tibialis tendinitis 08/12/2009   Qualifier: Diagnosis of  By: [facility] Alar MD, KARL    . Tinnitus 11/13/2013  . TROCHANTERIC BURSITIS, LEFT 04/27/2010   Qualifier: Diagnosis of  By: Ernestina Patches MD, Remo Lipps    . UNEQUAL LEG LENGTH  09/18/2008   Qualifier: Diagnosis of  By: [facility] Alar MD, KARL      Past Surgical History:  Procedure Laterality Date  . ABDOMINAL SURGERY    . ABDOMINAL SURGERY     for removal of abdominal mass-cancerous  . KNEE SURGERY    . MANDIBLE FRACTURE SURGERY    . NASAL SINUS SURGERY    . TONSILLECTOMY      There were no vitals filed for this visit.   Subjective Assessment - 08/12/19 0807    Subjective  Pt reports she was training for her next marathon and notes after a training run ~7-8 wks ago she started feeling like her L hip muscles started seizing up on her and she was unable to release muscles. Unaware of specific MOI but thinks it may be because she didn't do a stretch and warm-up that day as she usually does. Got some relief from massage but no lasting relief. Pain mostly lateral hip and now spreading down lateral thigh. Pain alters gait pattern and causes calf strain. Has had to withdraw from planned marathon in May.    Limitations  Sitting;Walking    How long can you sit comfortably?  stiff, painful after sitting for longer periods    How long can you walk comfortably?  2 - 2.5 miles (would normally walk 3 - 5 miles)    Patient Stated Goals  "get back to running w/o pain"    Currently in Pain?  No/denies    Pain Score  0-No pain   at rest, up to 5-6/10   Pain Location  Hip    Pain Orientation  Left    Pain Descriptors / Indicators  Other (Comment)   "annoying", "hurts"   Pain Type  Acute pain    Pain Radiating Towards  down lateral leg/quad to knee    Pain Onset  More than a month ago   7-8 weeks   Pain Frequency  Intermittent    Aggravating Factors   getting up after prolonged sitting, climbing stairs, walking inclines, sleeping on either side if not in "just the right position"    Pain Relieving Factors  massage - limited relief, rolling ITB, movement    Effect of Pain on Daily Activities  unable to run, has to use step-to pattern on stairs         [facility] PT Assessment -  08/12/19 0804      Assessment   Medical Diagnosis  L hip pain - TFL strain    Referring Provider (PT)  Karlton Lemon, MD    Onset Date/Surgical  Date  --   7-8 weeks   Next MD Visit  08/19/19    Prior Therapy  none for current problem; PT after surgery on L knee x 2, after L RCR      Precautions   Precautions  None      Restrictions   Weight Bearing Restrictions  No      Balance Screen   Has the patient fallen in the past 6 months  Yes    How many times?  1 - while trying to jump a baby gait    Has the patient had a decrease in activity level because of a fear of falling?   No    Is the patient reluctant to leave their home because of a fear of falling?   No      Home Environment   Living Environment  Private residence    Type of [place] to enter    Entrance Stairs-Number of Steps  1 or 1-2    Home Layout  Multi-level;Bed/bath upstairs   3-story   Alternate Level Stairs-Number of Steps  14      Prior Function   Level of Independence  Independent    Vocation  Full time employment    Vocation Requirements  working from home - [employer] (full workstation setup)    Leisure  running, walking, hiking, reading, knitting/crochet, cooking, gardening      Cognition   Overall Cognitive Status  Within Functional Limits for tasks assessed      Observation/Other Assessments   Focus on Therapeutic Outcomes (FOTO)   Hip - 77% (23% limitation); Predicted 83% (17% limitation)      ROM / Strength   AROM / PROM / Strength  AROM;Strength      AROM   Overall AROM   Within functional limits for tasks performed    AROM Assessment Site  Hip      Strength   Strength Assessment Site  Hip;Knee;Ankle    Right/Left Hip  Right;Left    Right Hip Flexion  4+/5    Right Hip Extension  4/5    Right Hip External Rotation   4/5    Right Hip Internal Rotation  4+/5    Right Hip ABduction  4/5    Right Hip ADduction  4/5    Left Hip Flexion  3+/5    Left Hip Extension   4-/5    Left Hip External Rotation  3+/5    Left Hip Internal Rotation  4-/5    Left Hip ABduction  3+/5    Left Hip ADduction  4-/5    Right/Left Knee  Right;Left    Right Knee Flexion  5/5    Right Knee Extension  5/5    Left Knee Flexion  4/5    Left Knee Extension  4+/5    Right/Left Ankle  Right;Left    Right Ankle Dorsiflexion  4+/5    Left Ankle Dorsiflexion  4+/5      Flexibility   Soft Tissue Assessment /Muscle Length  yes    Hamstrings  very mild tight B    Quadriceps  mild tight L quads & hip flexors    ITB  mild tight L    Piriformis  mild tight L      Palpation   Palpation comment  ttp over L TFL and lateral hip, L ITB, R glutes  Objective measurements completed on examination: See above findings.      [facility] Adult PT Treatment/Exercise - 08/12/19 0804      Self-Care   Self-Care  Heat/Ice Application    Heat/Ice Application  Educated pt on rationale for ice vs heat application and provided instruction in ice massage for greater trochanter.      Exercises   Exercises  Knee/Hip      Knee/Hip Exercises: Stretches   Hip Flexor Stretch  Left;30 seconds;1 rep    Hip Flexor Stretch Limitations  1/2 kneel lunge - cues to maintain neutral pelvis    ITB Stretch  Left;30 seconds;2 reps    ITB Stretch Limitations  supine with strap & standing lateral flexion + rotation      Knee/Hip Exercises: Standing   Hip Abduction  Left;10 reps;Stengthening;Knee straight    Abduction Limitations  glute med 45 dg kick back      Knee/Hip Exercises: Supine   Other Supine Knee/Hip Exercises  Psoas march with yellow TB at feet x 10             PT Education - 08/12/19 0901    Education Details  PT eval findings, anticipated POC, review of MD HEP + update, education on rationale for heat/ice application    Person(s) Educated  Patient    Methods  Explanation;Demonstration;Handout    Comprehension  Verbalized understanding;Returned demonstration;Need  further instruction       PT Short Term Goals - 08/12/19 0901      PT SHORT TERM GOAL #1   Title  Patient will be independent with initial HEP    Status  New    Target Date  08/26/19        PT Long Term Goals - 08/12/19 0901      PT LONG TERM GOAL #1   Title  Patient will be independent with ongoing/advanced HEP    Status  New    Target Date  09/23/19      PT LONG TERM GOAL #2   Title  Patient to demonstrate improved tissue quality and pliability with reduced pain    Status  New    Target Date  09/23/19      PT LONG TERM GOAL #3   Title  Patient will demonstrate improved B proximal LE strength to >/= 4 to 4+/5 for improved stability and ease of mobility    Status  New    Target Date  09/23/19      PT LONG TERM GOAL #4   Title  Patient will improve walking tolerance to >/= 3-4 miles w/o pain interference to allow resumption of normal daily activities    Status  New    Target Date  09/23/19      PT LONG TERM GOAL #5   Title  Patient will negotiate stairs reciprocally with normal step pattern w/o limitation due to L hip pain or weakness    Status  New    Target Date  09/23/19      PT LONG TERM GOAL #6   Title  Patient will be able to resume light jogging/running up to 2 miles w/o L hip pain    Status  New    Target Date  09/23/19             Plan - 08/12/19 0901    Clinical Impression Statement  Rhonda [name] is a 69 y/o female who presents to OP PT for new onset L hip pain while training for an  upcoming marathon in May. Pain originated 7-8 weeks ago after a training run; at which time she states her hip felt like it "seized up". She has been unable to successfully get the muscle to release with massage or MD prescribed HEP. Pain exacerbated by initiation of motion (sit to stand transfer and walking) after prolonged sitting, climbing stairs, walking inclines, and sleeping on either side if not in "just the right position". Current deficits include intermittent pain (up to  5-6/10), limited B proximal LE flexibility (L>R) and increased muscle tension with ttp throughout L TFL, lateral hip, ITB and lateral quads, as well as R glutes. Significant proximal L LE weakness also evident. Pain limits walking tolerance especially on inclines, prevents her from running/marathon training and causes her to have to negotiate stairs with a step-to pattern leading with her R LE. Rhonda [name] will benefit from skilled PT to address above deficits to restore normal flexibility and muscle tension, improve strength for better stability and to reduce or eliminate L hip pain. Initial MD HEP reviewed today with some clarifications and modifications provided.    Personal Factors and Comorbidities  Comorbidity 3+;Time since onset of injury/illness/exacerbation;Past/Current Experience;Age;Profession;Fitness    Comorbidities  h/o L knee scope x 2 for meniscal debridement and cyst removal, Tibial tendinitis, Patellar tendinitis, R Achilles tendinitis, Injury of plantaris muscle or tendon, Tibial stress fracture, Pelvic stress fracture, Metatarsal stress fracture, Hammer toe, L trochanteric bursitis, L RCR, Asthma (mild intermittent asthma without complication), GERD, BPPV, Sensorineural hearing loss, Breast cancer, Basal cell carcinoma    Examination-Activity Limitations  Locomotion Level;Sleep;Stairs;Transfers    Examination-Participation Restrictions  Community Activity;Other   marathon training   Stability/Clinical Decision Making  Stable/Uncomplicated    Clinical Decision Making  Low    Rehab Potential  Good    PT Frequency  2x / week    PT Duration  6 weeks    PT Treatment/Interventions  ADLs/Self Care Home Management;Cryotherapy;Electrical Stimulation;Moist Heat;Ultrasound;Gait training;Stair training;Functional mobility training;Therapeutic activities;Therapeutic exercise;Balance training;Neuromuscular re-education;Patient/family education;Manual techniques;Passive range of motion;Dry  needling;Taping;Joint Manipulations    PT Next Visit Plan  Review initial HEP; L hip flexibilty/stretching, proximal LE strengthening including glute minumus and medius, manual STM/MFR, modalities PRN (pt to bring in home TENS unit)    Consulted and Agree with Plan of Care  Patient       Patient will benefit from skilled therapeutic intervention in order to improve the following deficits and impairments:  Abnormal gait, Decreased activity tolerance, Decreased knowledge of precautions, Decreased mobility, Decreased strength, Difficulty walking, Increased edema, Increased fascial restricitons, Increased muscle spasms, Impaired perceived functional ability, Pain  Visit Diagnosis: Pain in left hip  Difficulty in walking, not elsewhere classified  Muscle weakness (generalized)  Other muscle spasm  Other symptoms and signs involving the musculoskeletal system     Problem List Patient Active Problem List   Diagnosis Date Noted  . GERD (gastroesophageal reflux disease) 05/07/2019  . Wrist pain, acute, left 04/23/2019  . Knee pain, left 06/15/2017  . Rotator cuff tendinitis, left 09/08/2016  . Right foot pain 04/06/2016  . Injury of plantaris muscle or tendon 10/30/2014  . Achilles tendinitis of right lower extremity 06/19/2014  . Thrombocytopenia ([place]) 01/30/2014  . Benign paroxysmal positional vertigo 11/13/2013  . Insomnia 11/13/2013  . Sensorineural hearing loss 11/13/2013  . Tinnitus 11/13/2013  . Hyperlipidemia LDL goal <130 10/24/2013  . Mild intermittent asthma without complication [id number]  . Right knee pain 04/23/2013  . Patellar tendinitis 03/07/2013  . Muscle tear 11/15/2012  .  Metatarsal stress fracture 08/29/2012  . Numbness and tingling of foot 03/29/2012  . Ankle pain 08/17/2011  . Chest pain in adult 08/17/2011  . TROCHANTERIC BURSITIS, LEFT 04/27/2010  . LEG PAIN, RIGHT 09/22/2009  . TIBIALIS TENDINITIS 08/12/2009  . STRESS FRACTURE, TIBIA 06/15/2009  .  UNEQUAL LEG LENGTH 09/18/2008  . CARCINOMA, BASAL CELL 02/07/2008  . Malignant neoplasm of female breast ([place]) 02/07/2008  . STRESS FRACTURE OF PELVIS 08/29/2007  . HAMMER TOE, OTHER, ACQUIRED 10/13/2006  . ALLERGIC RHINITIS 10/12/2006  . Asthma 10/12/2006  . Allergic rhinitis 10/12/2006    Percival Spanish, PT, MPT 08/12/2019, 7:54 PM  [facility] [address]  [place], [place], [address] Phone: [phone]   Fax:  [phone]  Name: JONNY SYLVE MRN: [id number] Date of Birth: 1951-01-19

## 2019-08-15 ENCOUNTER — Other Ambulatory Visit: Payer: Self-pay

## 2019-08-15 ENCOUNTER — Ambulatory Visit: Payer: 59

## 2019-08-15 DIAGNOSIS — M79671 Pain in right foot: Secondary | ICD-10-CM | POA: Diagnosis not present

## 2019-08-15 DIAGNOSIS — M62838 Other muscle spasm: Secondary | ICD-10-CM

## 2019-08-15 DIAGNOSIS — M6281 Muscle weakness (generalized): Secondary | ICD-10-CM | POA: Diagnosis not present

## 2019-08-15 DIAGNOSIS — R29898 Other symptoms and signs involving the musculoskeletal system: Secondary | ICD-10-CM

## 2019-08-15 DIAGNOSIS — M25552 Pain in left hip: Secondary | ICD-10-CM

## 2019-08-15 DIAGNOSIS — S335XXA Sprain of ligaments of lumbar spine, initial encounter: Secondary | ICD-10-CM | POA: Diagnosis not present

## 2019-08-15 DIAGNOSIS — R262 Difficulty in walking, not elsewhere classified: Secondary | ICD-10-CM

## 2019-08-15 NOTE — Patient Instructions (Signed)

## 2019-08-15 NOTE — Therapy (Signed)
[facility] [address]  [place], [place], [address] Phone: [phone]   Fax:  [phone]  Physical Therapy Treatment  Patient Details  Name: Rhonda [name] MRN: [id number] Date of Birth: 16-Oct-1950 Referring Provider (PT): Karlton Lemon, MD   Encounter Date: 08/15/2019  PT End of Session - 08/15/19 1716    Visit Number  2    Number of Visits  12    Date for PT Re-Evaluation  09/23/19    Authorization Type  [facility]    PT Start Time  1700    PT Stop Time  1755    PT Time Calculation (min)  55 min    Activity Tolerance  Patient tolerated treatment well    Behavior During Therapy  [facility] for tasks assessed/performed       Past Medical History:  Diagnosis Date  . Achilles tendinitis of right lower extremity 06/19/2014   US showed minor changes in December 15  Most sxs seem to be enthesopathy  Note had reaction to NTG in past  Overview:  Overview:  US showed minor changes in December 15  Most sxs seem to be enthesopathy  Note had reaction to NTG in past  Last Assessment & Plan:  Recent irritation in the race appears to be a partial plantaris tear  This continues to give her some Achilles symptoms  I think a period  . ALLERGIC RHINITIS 10/12/2006   Qualifier: Diagnosis of  By: McDiarmid MD, Sherren Mocha    . Ankle pain 08/17/2011  . Asthma 10/12/2006   Qualifier: Diagnosis of  By: McDiarmid MD, Sherren Mocha    Overview:  Overview:  Qualifier: Diagnosis of  By: McDiarmid MD, Sherren Mocha   . CARCINOMA, BASAL CELL 02/07/2008   Qualifier: History of  By: Drue Flirt  MD, Merrily Brittle    Overview:  Overview:  Qualifier: History of  By: Drue Flirt  MD, Merrily Brittle   Overview:  Qualifier: History of  By: Drue Flirt  MD, Merrily Brittle   . Chest pain in adult 08/17/2011   This has worrisome features particularly with her level of exertion   . HAMMER TOE, OTHER, ACQUIRED 10/13/2006   Qualifier: Diagnosis of  By: McDiarmid MD, Sherren Mocha    Overview:  Overview:  Qualifier: Diagnosis of  By:  McDiarmid MD, Sherren Mocha   . High cholesterol   . Hyperlipidemia LDL goal <130 10/24/2013  . Injury of plantaris muscle or tendon 10/30/2014  . Insomnia 11/13/2013  . Knee pain, left 06/15/2017  . LEG PAIN, RIGHT 09/22/2009   Qualifier: Diagnosis of  By: Oneida Alar MD, KARL    . Malignant neoplasm of female breast ([place]) 02/07/2008   Qualifier: History of  By: Drue Flirt  MD, Merrily Brittle    Overview:  Overview:  Qualifier: History of  By: Drue Flirt  MD, Merrily Brittle  Overview:  Overview:  Qualifier: History of  By: Drue Flirt  MD, Merrily Brittle   . Metatarsal stress fracture 08/29/2012   This is at the distal shaft of the fifth metatarsal left foot   . Mild intermittent asthma without complication [id number]  . Muscle tear 11/15/2012   Left rectus femoris; also like tendon tear   . Numbness and tingling of foot 03/29/2012  . Patellar tendinitis 03/07/2013   I suspect this occurred because she did less crosstraining before this marathon and was probably hamstring dominant   . Right foot pain 04/06/2016  . Right knee pain 04/23/2013   04/23/13  ultrasound today RT knee  revealed no significant effusion The  quadriceps tendon is intact as is the patellar tendon The medial meniscus was normal The lateral meniscus had an area of calcification and a very small area of splitting along the midline to the posterior third of the joint line   . Rotator cuff tendinitis, left 09/08/2016   Overview:  Last Assessment & Plan:  Significant improvement  See OV summary  . Sensorineural hearing loss 11/13/2013  . Stress fracture of pelvis 08/29/2007   Qualifier: History of  By: Drue Flirt  MD, Merrily Brittle    . STRESS FRACTURE, TIBIA 06/15/2009   Qualifier: Diagnosis of  By: Drue Flirt  MD, Merrily Brittle    . Thrombocytopenia ([employer]) 01/30/2014  . Tibialis tendinitis 08/12/2009   Qualifier: Diagnosis of  By: Oneida Alar MD, KARL    . Tinnitus 11/13/2013  . TROCHANTERIC BURSITIS, LEFT 04/27/2010   Qualifier: Diagnosis of  By: Ernestina Patches MD, Remo Lipps    . UNEQUAL LEG LENGTH 09/18/2008    Qualifier: Diagnosis of  By: Oneida Alar MD, KARL      Past Surgical History:  Procedure Laterality Date  . ABDOMINAL SURGERY    . ABDOMINAL SURGERY     for removal of abdominal mass-cancerous  . KNEE SURGERY    . MANDIBLE FRACTURE SURGERY    . NASAL SINUS SURGERY    . TONSILLECTOMY      There were no vitals filed for this visit.  Subjective Assessment - 08/15/19 1703    Subjective  Pt. reporting she has been performing PT HEP and MD HEP.    Patient Stated Goals  "get back to running w/o pain"    Currently in Pain?  No/denies    Pain Score  0-No pain   Pain rising 8/10 at most   Pain Location  Hip    Pain Orientation  Left    Pain Descriptors / Indicators  Sharp   " Annoying"   Pain Type  Acute pain                       [facility] Adult PT Treatment/Exercise - 08/15/19 0001      Self-Care   Self-Care  Other Self-Care Comments    Other Self-Care Comments   reviewed proper setup and electrode placement of pt. home TENS unit she brought in with her to session today      Knee/Hip Exercises: Stretches   Hip Flexor Stretch  Left;30 seconds;1 rep    Hip Flexor Stretch Limitations  1/2 kneel lunge - cues to maintain neutral pelvis    ITB Stretch  Left;30 seconds;1 rep    ITB Stretch Limitations  supine and standing leaning into wall     Piriformis Stretch  Left;2 reps;30 seconds    Piriformis Stretch Limitations  sitting figure-4 stretch       Knee/Hip Exercises: Aerobic   Nustep  Lvl 3, 6 min (LEs)      Knee/Hip Exercises: Standing   Step Down  Left;5 reps;Step Height: 2";Hand Hold: 2    Step Down Limitations  2" hip hike well tolerated at machine bolster       Knee/Hip Exercises: Supine   Other Supine Knee/Hip Exercises  Psoas march with yellow TB at feet x 10      Knee/Hip Exercises: Sidelying   Hip ABduction  Left;10 reps;Strengthening    Clams  L clam shell in R sidelying with yellow TB x 5 reps              PT Education - 08/15/19 1816  Education Details  Iontophoresis educational handout including contraindications, precautions, proper wear time    Person(s) Educated  Patient    Methods  Explanation;Demonstration;Verbal cues;Handout    Comprehension  Verbalized understanding;Returned demonstration;Verbal cues required       PT Short Term Goals - 08/15/19 1811      PT SHORT TERM GOAL #1   Title  Patient will be independent with initial HEP    Status  Achieved    Target Date  08/26/19        PT Long Term Goals - 08/15/19 1817      PT LONG TERM GOAL #1   Title  Patient will be independent with ongoing/advanced HEP    Status  On-going      PT LONG TERM GOAL #2   Title  Patient to demonstrate improved tissue quality and pliability with reduced pain    Status  On-going      PT LONG TERM GOAL #3   Title  Patient will demonstrate improved B proximal LE strength to >/= 4 to 4+/5 for improved stability and ease of mobility    Status  On-going      PT LONG TERM GOAL #4   Title  Patient will improve walking tolerance to >/= 3-4 miles w/o pain interference to allow resumption of normal daily activities    Status  On-going      PT LONG TERM GOAL #5   Title  Patient will negotiate stairs reciprocally with normal step pattern w/o limitation due to L hip pain or weakness    Status  On-going      PT LONG TERM GOAL #6   Title  Patient will be able to resume light jogging/running up to 2 miles w/o L hip pain    Status  On-going            Plan - 08/15/19 1742    Clinical Impression Statement  Pt. able to verbalize good understanding of initial PT HEP with review today with only minor correction required.  STG #1.  Reviewed pt. home TENS unit she brought into session today for clarification of proper electrode placement and setup; pt. verbalizing understanding following instruction.  Pt. some complaint of L hip irritation after 4" L hip hike which improved with rest.  Pt. noting she had been performing MD HEP "hip  hike" exercise on regular height step at home (6-8" step).  Reviewed strategies to resume this activity on 2-4" step to reduce intensity with pt. verbalizing understanding.  Ended visit with iontophoresis educational handout issued to pt. including proper wear time, contraindications, precautions.  Pt. verbalized understanding and wishing to read over this handout for possible trial of ionto in future visits.    Comorbidities  h/o L knee scope x 2 for meniscal debridement and cyst removal, Tibial tendinitis, Patellar tendinitis, R Achilles tendinitis, Injury of plantaris muscle or tendon, Tibial stress fracture, Pelvic stress fracture, Metatarsal stress fracture, Hammer toe, L trochanteric bursitis, L RCR, Asthma (mild intermittent asthma without complication), GERD, BPPV, Sensorineural hearing loss, Breast cancer, Basal cell carcinoma    PT Treatment/Interventions  ADLs/Self Care Home Management;Cryotherapy;Electrical Stimulation;Moist Heat;Ultrasound;Gait training;Stair training;Functional mobility training;Therapeutic activities;Therapeutic exercise;Balance training;Neuromuscular re-education;Patient/family education;Manual techniques;Passive range of motion;Dry needling;Taping;Joint Manipulations    PT Next Visit Plan  L hip flexibilty/stretching, proximal LE strengthening including glute minumus and medius, manual STM/MFR, modalities PRN    Consulted and Agree with Plan of Care  Patient       Patient will benefit from skilled  therapeutic intervention in order to improve the following deficits and impairments:  Abnormal gait, Decreased activity tolerance, Decreased knowledge of precautions, Decreased mobility, Decreased strength, Difficulty walking, Increased edema, Increased fascial restricitons, Increased muscle spasms, Impaired perceived functional ability, Pain  Visit Diagnosis: Pain in left hip  Difficulty in walking, not elsewhere classified  Muscle weakness (generalized)  Other muscle  spasm  Other symptoms and signs involving the musculoskeletal system     Problem List Patient Active Problem List   Diagnosis Date Noted  . GERD (gastroesophageal reflux disease) 05/07/2019  . Wrist pain, acute, left 04/23/2019  . Knee pain, left 06/15/2017  . Rotator cuff tendinitis, left 09/08/2016  . Right foot pain 04/06/2016  . Injury of plantaris muscle or tendon 10/30/2014  . Achilles tendinitis of right lower extremity 06/19/2014  . Thrombocytopenia ([place]) 01/30/2014  . Benign paroxysmal positional vertigo 11/13/2013  . Insomnia 11/13/2013  . Sensorineural hearing loss 11/13/2013  . Tinnitus 11/13/2013  . Hyperlipidemia LDL goal <130 10/24/2013  . Mild intermittent asthma without complication [id number]  . Right knee pain 04/23/2013  . Patellar tendinitis 03/07/2013  . Muscle tear 11/15/2012  . Metatarsal stress fracture 08/29/2012  . Numbness and tingling of foot 03/29/2012  . Ankle pain 08/17/2011  . Chest pain in adult 08/17/2011  . TROCHANTERIC BURSITIS, LEFT 04/27/2010  . LEG PAIN, RIGHT 09/22/2009  . TIBIALIS TENDINITIS 08/12/2009  . STRESS FRACTURE, TIBIA 06/15/2009  . UNEQUAL LEG LENGTH 09/18/2008  . CARCINOMA, BASAL CELL 02/07/2008  . Malignant neoplasm of female breast ([place]) 02/07/2008  . STRESS FRACTURE OF PELVIS 08/29/2007  . HAMMER TOE, OTHER, ACQUIRED 10/13/2006  . ALLERGIC RHINITIS 10/12/2006  . Asthma 10/12/2006  . Allergic rhinitis 10/12/2006    Bess Harvest, PTA 08/15/19 6:25 PM   [facility] [address]  [place], [place], [address] Phone: [phone]   Fax:  [phone]  Name: RAFAELA ALLYN MRN: [id number] Date of Birth: [birth year]

## 2019-08-19 ENCOUNTER — Other Ambulatory Visit: Payer: Self-pay

## 2019-08-19 ENCOUNTER — Ambulatory Visit: Payer: 59 | Admitting: Physical Therapy

## 2019-08-19 ENCOUNTER — Ambulatory Visit: Payer: 59 | Admitting: Family Medicine

## 2019-08-19 ENCOUNTER — Encounter: Payer: Self-pay | Admitting: Physical Therapy

## 2019-08-19 DIAGNOSIS — R29898 Other symptoms and signs involving the musculoskeletal system: Secondary | ICD-10-CM

## 2019-08-19 DIAGNOSIS — R262 Difficulty in walking, not elsewhere classified: Secondary | ICD-10-CM | POA: Diagnosis not present

## 2019-08-19 DIAGNOSIS — M25552 Pain in left hip: Secondary | ICD-10-CM

## 2019-08-19 DIAGNOSIS — M62838 Other muscle spasm: Secondary | ICD-10-CM

## 2019-08-19 DIAGNOSIS — S335XXA Sprain of ligaments of lumbar spine, initial encounter: Secondary | ICD-10-CM | POA: Diagnosis not present

## 2019-08-19 DIAGNOSIS — M6281 Muscle weakness (generalized): Secondary | ICD-10-CM

## 2019-08-19 DIAGNOSIS — M79671 Pain in right foot: Secondary | ICD-10-CM | POA: Diagnosis not present

## 2019-08-19 NOTE — Therapy (Signed)
[facility] [address]  [place], [place], [address] Phone: [phone]   Fax:  [phone]  Physical Therapy Treatment  Patient Details  Name: Rhonda [name] MRN: [id number] Date of Birth: October 29, 1950 Referring Provider (PT): Karlton Lemon, MD   Encounter Date: 08/19/2019  PT End of Session - 08/19/19 1705    Visit Number  3    Number of Visits  12    Date for PT Re-Evaluation  09/23/19    Authorization Type  [facility]    PT Start Time  1705    PT Stop Time  1754    PT Time Calculation (min)  49 min    Activity Tolerance  Patient tolerated treatment well    Behavior During Therapy  [facility] for tasks assessed/performed       Past Medical History:  Diagnosis Date  . Achilles tendinitis of right lower extremity 06/19/2014   US showed minor changes in December 15  Most sxs seem to be enthesopathy  Note had reaction to NTG in past  Overview:  Overview:  US showed minor changes in December 15  Most sxs seem to be enthesopathy  Note had reaction to NTG in past  Last Assessment & Plan:  Recent irritation in the race appears to be a partial plantaris tear  This continues to give her some Achilles symptoms  I think a period  . ALLERGIC RHINITIS 10/12/2006   Qualifier: Diagnosis of  By: McDiarmid MD, Sherren Mocha    . Ankle pain 08/17/2011  . Asthma 10/12/2006   Qualifier: Diagnosis of  By: McDiarmid MD, Sherren Mocha    Overview:  Overview:  Qualifier: Diagnosis of  By: McDiarmid MD, Sherren Mocha   . CARCINOMA, BASAL CELL 02/07/2008   Qualifier: History of  By: Drue Flirt  MD, Merrily Brittle    Overview:  Overview:  Qualifier: History of  By: Drue Flirt  MD, Merrily Brittle   Overview:  Qualifier: History of  By: Drue Flirt  MD, Merrily Brittle   . Chest pain in adult 08/17/2011   This has worrisome features particularly with her level of exertion   . HAMMER TOE, OTHER, ACQUIRED 10/13/2006   Qualifier: Diagnosis of  By: McDiarmid MD, Sherren Mocha    Overview:  Overview:  Qualifier: Diagnosis of   By: McDiarmid MD, Sherren Mocha   . High cholesterol   . Hyperlipidemia LDL goal <130 10/24/2013  . Injury of plantaris muscle or tendon 10/30/2014  . Insomnia 11/13/2013  . Knee pain, left 06/15/2017  . LEG PAIN, RIGHT 09/22/2009   Qualifier: Diagnosis of  By: Oneida Alar MD, KARL    . Malignant neoplasm of female breast ([place]) 02/07/2008   Qualifier: History of  By: Drue Flirt  MD, Merrily Brittle    Overview:  Overview:  Qualifier: History of  By: Drue Flirt  MD, Merrily Brittle  Overview:  Overview:  Qualifier: History of  By: Drue Flirt  MD, Merrily Brittle   . Metatarsal stress fracture 08/29/2012   This is at the distal shaft of the fifth metatarsal left foot   . Mild intermittent asthma without complication [id number]  . Muscle tear 11/15/2012   Left rectus femoris; also like tendon tear   . Numbness and tingling of foot 03/29/2012  . Patellar tendinitis 03/07/2013   I suspect this occurred because she did less crosstraining before this marathon and was probably hamstring dominant   . Right foot pain 04/06/2016  . Right knee pain 04/23/2013   04/23/13  ultrasound today RT knee  revealed no significant effusion The  quadriceps tendon is intact as is the patellar tendon The medial meniscus was normal The lateral meniscus had an area of calcification and a very small area of splitting along the midline to the posterior third of the joint line   . Rotator cuff tendinitis, left 09/08/2016   Overview:  Last Assessment & Plan:  Significant improvement  See OV summary  . Sensorineural hearing loss 11/13/2013  . Stress fracture of pelvis 08/29/2007   Qualifier: History of  By: Drue Flirt  MD, Merrily Brittle    . STRESS FRACTURE, TIBIA 06/15/2009   Qualifier: Diagnosis of  By: Drue Flirt  MD, Merrily Brittle    . Thrombocytopenia ([place]) 01/30/2014  . Tibialis tendinitis 08/12/2009   Qualifier: Diagnosis of  By: Oneida Alar MD, KARL    . Tinnitus 11/13/2013  . TROCHANTERIC BURSITIS, LEFT 04/27/2010   Qualifier: Diagnosis of  By: Ernestina Patches MD, Remo Lipps    . UNEQUAL LEG LENGTH  09/18/2008   Qualifier: Diagnosis of  By: Oneida Alar MD, KARL      Past Surgical History:  Procedure Laterality Date  . ABDOMINAL SURGERY    . ABDOMINAL SURGERY     for removal of abdominal mass-cancerous  . KNEE SURGERY    . MANDIBLE FRACTURE SURGERY    . NASAL SINUS SURGERY    . TONSILLECTOMY      There were no vitals filed for this visit.  Subjective Assessment - 08/19/19 1707    Subjective  Pt reports she walked 2 miles at lunch today - denies sharp pain but states hip was "annoying" throughout the walk. Tried running briefly but only able to go ~20 strides before she had to stop, stating poor tolerance for the impact with running due to pain that felt like it was more in the joint than the muscles. She also notes increased incidence of calf cramping lately for which she has been taking mustard.    Patient Stated Goals  "get back to running w/o pain"    Currently in Pain?  No/denies                       [facility] Adult PT Treatment/Exercise - 08/19/19 1705      Knee/Hip Exercises: Stretches   Hip Flexor Stretch  Left;30 seconds;1 rep    Hip Flexor Stretch Limitations  1/2 kneel lunge + strap for quad stretch - cues to maintain neutral pelvis    ITB Stretch  Left;30 seconds;1 rep    ITB Stretch Limitations  manual by PT - sidelying with hip extended & supine cross body    Piriformis Stretch  Left;30 seconds;1 rep    Piriformis Stretch Limitations  manual by PT - figure 4 to chest & KTOS      Knee/Hip Exercises: Aerobic   Elliptical  L2.0 x 3 min      Manual Therapy   Manual Therapy  Soft tissue mobilization;Myofascial release;Passive ROM    Manual therapy comments  supine & R side lying with L LE supported on bolster    Soft tissue mobilization  STM/DTM to L TFL, glutes and piriformis - very ttp in TFL, glute medius and piriformis    Myofascial Release  manual TPR to L TFL, glute medius and piriformis    Passive ROM  manual stretching as above                PT Short Term Goals - 08/19/19 1754      PT SHORT TERM GOAL #1   Title  Patient will be independent with initial HEP    Status  Achieved   08/15/19       PT Long Term Goals - 08/19/19 1754      PT LONG TERM GOAL #1   Title  Patient will be independent with ongoing/advanced HEP    Status  On-going    Target Date  09/23/19      PT LONG TERM GOAL #2   Title  Patient to demonstrate improved tissue quality and pliability with reduced pain    Status  On-going    Target Date  09/23/19      PT LONG TERM GOAL #3   Title  Patient will demonstrate improved B proximal LE strength to >/= 4 to 4+/5 for improved stability and ease of mobility    Status  On-going    Target Date  09/23/19      PT LONG TERM GOAL #4   Title  Patient will improve walking tolerance to >/= 3-4 miles w/o pain interference to allow resumption of normal daily activities    Status  On-going    Target Date  09/23/19      PT LONG TERM GOAL #5   Title  Patient will negotiate stairs reciprocally with normal step pattern w/o limitation due to L hip pain or weakness    Status  On-going    Target Date  09/23/19      PT LONG TERM GOAL #6   Title  Patient will be able to resume light jogging/running up to 2 miles w/o L hip pain    Status  On-going    Target Date  09/23/19            Plan - 08/19/19 1754    Clinical Impression Statement  [name] appears very frustrated by ongoing pain and lack of improvement, admitting to her desire for an instant fix to allow her to get back to running. She admits to pushing through 30 reps with exercises even though pain becomes "almost unbearable" after 10 reps - reinforced need to ease back on HEP exercises, starting with whatever number of reps she can tolerate before pain starts to increase and staying at this level for a few days before very gradually attempting to increase reps/intensity. Same guidance provided with walking for exercise, avoiding pushing  beyond pain limits. Therapy session focusing on manual STM/DTM and MFR/TPR with significant ttp identified in L TFL, glute medius and piriformis which improved somewhat following manual therapy and gentle manual stretching. Patient reports she has uses her home TENS unit 2x in the middle of the night when she has been unable to sleep and feels that she gets ~1 hr relief from TENS. Patient requesting to defer trial of ionto patch until at least Friday's visit to see if current interventions are helping before trying ionto.    Personal Factors and Comorbidities  Comorbidity 3+;Time since onset of injury/illness/exacerbation;Past/Current Experience;Age;Profession;Fitness    Comorbidities  h/o L knee scope x 2 for meniscal debridement and cyst removal, Tibial tendinitis, Patellar tendinitis, R Achilles tendinitis, Injury of plantaris muscle or tendon, Tibial stress fracture, Pelvic stress fracture, Metatarsal stress fracture, Hammer toe, L trochanteric bursitis, L RCR, Asthma (mild intermittent asthma without complication), GERD, BPPV, Sensorineural hearing loss, Breast cancer, Basal cell carcinoma    Examination-Activity Limitations  Locomotion Level;Sleep;Stairs;Transfers    Examination-Participation Restrictions  Community Activity;Other    PT Frequency  2x / week    PT Duration  6 weeks    PT  Treatment/Interventions  ADLs/Self Care Home Management;Cryotherapy;Electrical Stimulation;Moist Heat;Ultrasound;Gait training;Stair training;Functional mobility training;Therapeutic activities;Therapeutic exercise;Balance training;Neuromuscular re-education;Patient/family education;Manual techniques;Passive range of motion;Dry needling;Taping;Joint Manipulations    PT Next Visit Plan  L hip flexibilty/stretching, proximal LE strengthening including glute minumus and medius, manual STM/MFR, modalities PRN    Consulted and Agree with Plan of Care  Patient       Patient will benefit from skilled therapeutic  intervention in order to improve the following deficits and impairments:  Abnormal gait, Decreased activity tolerance, Decreased knowledge of precautions, Decreased mobility, Decreased strength, Difficulty walking, Increased edema, Increased fascial restricitons, Increased muscle spasms, Impaired perceived functional ability, Pain  Visit Diagnosis: Pain in left hip  Difficulty in walking, not elsewhere classified  Muscle weakness (generalized)  Other muscle spasm  Other symptoms and signs involving the musculoskeletal system     Problem List Patient Active Problem List   Diagnosis Date Noted  . GERD (gastroesophageal reflux disease) 05/07/2019  . Wrist pain, acute, left 04/23/2019  . Knee pain, left 06/15/2017  . Rotator cuff tendinitis, left 09/08/2016  . Right foot pain 04/06/2016  . Injury of plantaris muscle or tendon 10/30/2014  . Achilles tendinitis of right lower extremity 06/19/2014  . Thrombocytopenia ([place]) 01/30/2014  . Benign paroxysmal positional vertigo 11/13/2013  . Insomnia 11/13/2013  . Sensorineural hearing loss 11/13/2013  . Tinnitus 11/13/2013  . Hyperlipidemia LDL goal <130 10/24/2013  . Mild intermittent asthma without complication [id number]  . Right knee pain 04/23/2013  . Patellar tendinitis 03/07/2013  . Muscle tear 11/15/2012  . Metatarsal stress fracture 08/29/2012  . Numbness and tingling of foot 03/29/2012  . Ankle pain 08/17/2011  . Chest pain in adult 08/17/2011  . TROCHANTERIC BURSITIS, LEFT 04/27/2010  . LEG PAIN, RIGHT 09/22/2009  . TIBIALIS TENDINITIS 08/12/2009  . STRESS FRACTURE, TIBIA 06/15/2009  . UNEQUAL LEG LENGTH 09/18/2008  . CARCINOMA, BASAL CELL 02/07/2008  . Malignant neoplasm of female breast ([place]) 02/07/2008  . STRESS FRACTURE OF PELVIS 08/29/2007  . HAMMER TOE, OTHER, ACQUIRED 10/13/2006  . ALLERGIC RHINITIS 10/12/2006  . Asthma 10/12/2006  . Allergic rhinitis 10/12/2006    Percival Spanish, PT, MPT 08/19/2019, 6:31  PM  [facility] [address]  [place], [place], [address] Phone: [phone]   Fax:  [phone]  Name: Rhonda [name] MRN: [id number] Date of Birth: [birth year]

## 2019-08-21 ENCOUNTER — Ambulatory Visit: Payer: 59 | Admitting: Family Medicine

## 2019-08-21 MED FILL — ROSUVASTATIN CALCIUM 10 MG: 10 | 90 days supply | Qty: 90 | Fill #2

## 2019-08-23 ENCOUNTER — Ambulatory Visit: Payer: 59

## 2019-08-23 ENCOUNTER — Other Ambulatory Visit: Payer: Self-pay

## 2019-08-23 DIAGNOSIS — S335XXA Sprain of ligaments of lumbar spine, initial encounter: Secondary | ICD-10-CM | POA: Diagnosis not present

## 2019-08-23 DIAGNOSIS — M6281 Muscle weakness (generalized): Secondary | ICD-10-CM | POA: Diagnosis not present

## 2019-08-23 DIAGNOSIS — H2513 Age-related nuclear cataract, bilateral: Secondary | ICD-10-CM | POA: Diagnosis not present

## 2019-08-23 DIAGNOSIS — H524 Presbyopia: Secondary | ICD-10-CM | POA: Diagnosis not present

## 2019-08-23 DIAGNOSIS — H52203 Unspecified astigmatism, bilateral: Secondary | ICD-10-CM | POA: Diagnosis not present

## 2019-08-23 DIAGNOSIS — M79671 Pain in right foot: Secondary | ICD-10-CM | POA: Diagnosis not present

## 2019-08-23 DIAGNOSIS — R262 Difficulty in walking, not elsewhere classified: Secondary | ICD-10-CM

## 2019-08-23 DIAGNOSIS — M62838 Other muscle spasm: Secondary | ICD-10-CM | POA: Diagnosis not present

## 2019-08-23 DIAGNOSIS — M25552 Pain in left hip: Secondary | ICD-10-CM | POA: Diagnosis not present

## 2019-08-23 DIAGNOSIS — R29898 Other symptoms and signs involving the musculoskeletal system: Secondary | ICD-10-CM

## 2019-08-23 DIAGNOSIS — H5213 Myopia, bilateral: Secondary | ICD-10-CM | POA: Diagnosis not present

## 2019-08-23 NOTE — Therapy (Signed)
[facility] [address]  [place], [place], [address] Phone: [phone]   Fax:  [phone]  Physical Therapy Treatment  Patient Details  Name: Rhonda [name] MRN: [id number] Date of Birth: [birth year] Referring Provider (PT): Karlton Lemon, MD   Encounter Date: 08/23/2019  PT End of Session - 08/23/19 1025    Visit Number  4    Number of Visits  12    Date for PT Re-Evaluation  09/23/19    Authorization Type  [facility]    PT Start Time  [id number]    PT Stop Time  1110   Ended visit with 10 min moist heat to L hip   PT Time Calculation (min)  55 min    Activity Tolerance  Patient tolerated treatment well    Behavior During Therapy  [facility] for tasks assessed/performed       Past Medical History:  Diagnosis Date  . Achilles tendinitis of right lower extremity 06/19/2014   US showed minor changes in December 15  Most sxs seem to be enthesopathy  Note had reaction to NTG in past  Overview:  Overview:  US showed minor changes in December 15  Most sxs seem to be enthesopathy  Note had reaction to NTG in past  Last Assessment & Plan:  Recent irritation in the race appears to be a partial plantaris tear  This continues to give her some Achilles symptoms  I think a period  . ALLERGIC RHINITIS 10/12/2006   Qualifier: Diagnosis of  By: McDiarmid MD, Sherren Mocha    . Ankle pain 08/17/2011  . Asthma 10/12/2006   Qualifier: Diagnosis of  By: McDiarmid MD, Sherren Mocha    Overview:  Overview:  Qualifier: Diagnosis of  By: McDiarmid MD, Sherren Mocha   . CARCINOMA, BASAL CELL 02/07/2008   Qualifier: History of  By: Drue Flirt  MD, Merrily Brittle    Overview:  Overview:  Qualifier: History of  By: Drue Flirt  MD, Merrily Brittle   Overview:  Qualifier: History of  By: Drue Flirt  MD, Merrily Brittle   . Chest pain in adult 08/17/2011   This has worrisome features particularly with her level of exertion   . HAMMER TOE, OTHER, ACQUIRED 10/13/2006   Qualifier: Diagnosis of  By: McDiarmid MD, Sherren Mocha     Overview:  Overview:  Qualifier: Diagnosis of  By: McDiarmid MD, Sherren Mocha   . High cholesterol   . Hyperlipidemia LDL goal <130 10/24/2013  . Injury of plantaris muscle or tendon 10/30/2014  . Insomnia 11/13/2013  . Knee pain, left 06/15/2017  . LEG PAIN, RIGHT 09/22/2009   Qualifier: Diagnosis of  By: Oneida Alar MD, KARL    . Malignant neoplasm of female breast ([place]) 02/07/2008   Qualifier: History of  By: Drue Flirt  MD, Merrily Brittle    Overview:  Overview:  Qualifier: History of  By: Drue Flirt  MD, Merrily Brittle  Overview:  Overview:  Qualifier: History of  By: Drue Flirt  MD, Merrily Brittle   . Metatarsal stress fracture 08/29/2012   This is at the distal shaft of the fifth metatarsal left foot   . Mild intermittent asthma without complication [id number]  . Muscle tear 11/15/2012   Left rectus femoris; also like tendon tear   . Numbness and tingling of foot 03/29/2012  . Patellar tendinitis 03/07/2013   I suspect this occurred because she did less crosstraining before this marathon and was probably hamstring dominant   . Right foot pain 04/06/2016  . Right knee pain 04/23/2013   04/23/13  ultrasound today RT knee  revealed no significant effusion The quadriceps tendon is intact as is the patellar tendon The medial meniscus was normal The lateral meniscus had an area of calcification and a very small area of splitting along the midline to the posterior third of the joint line   . Rotator cuff tendinitis, left 09/08/2016   Overview:  Last Assessment & Plan:  Significant improvement  See OV summary  . Sensorineural hearing loss 11/13/2013  . Stress fracture of pelvis 08/29/2007   Qualifier: History of  By: Drue Flirt  MD, Merrily Brittle    . STRESS FRACTURE, TIBIA 06/15/2009   Qualifier: Diagnosis of  By: Drue Flirt  MD, Merrily Brittle    . Thrombocytopenia ([place]) 01/30/2014  . Tibialis tendinitis 08/12/2009   Qualifier: Diagnosis of  By: Oneida Alar MD, KARL    . Tinnitus 11/13/2013  . TROCHANTERIC BURSITIS, LEFT 04/27/2010   Qualifier: Diagnosis of  By:  Ernestina Patches MD, Remo Lipps    . UNEQUAL LEG LENGTH 09/18/2008   Qualifier: Diagnosis of  By: Oneida Alar MD, KARL      Past Surgical History:  Procedure Laterality Date  . ABDOMINAL SURGERY    . ABDOMINAL SURGERY     for removal of abdominal mass-cancerous  . KNEE SURGERY    . MANDIBLE FRACTURE SURGERY    . NASAL SINUS SURGERY    . TONSILLECTOMY      There were no vitals filed for this visit.  Subjective Assessment - 08/23/19 1017    Subjective  Pt. noting relief    Patient Stated Goals  "get back to running w/o pain"    Currently in Pain?  No/denies    Pain Score  0-No pain    Pain Location  Hip    Pain Orientation  Left    Pain Descriptors / Indicators  Sharp    Pain Type  Acute pain    Pain Onset  More than a month ago    Pain Frequency  Intermittent    Multiple Pain Sites  No                       [facility] Adult PT Treatment/Exercise - 08/23/19 0001      Knee/Hip Exercises: Stretches   Passive Hamstring Stretch  Left;1 rep;30 seconds    Passive Hamstring Stretch Limitations  Manual with therapist     Hip Flexor Stretch  Left;30 seconds;1 rep    Hip Flexor Stretch Limitations  Manual with therapist     ITB Stretch  Left;30 seconds;1 rep    ITB Stretch Limitations  Manual with therapist       Knee/Hip Exercises: Aerobic   Elliptical  L2.0 x 6 min      Knee/Hip Exercises: Seated   Other Seated Knee/Hip Exercises  Setaed B hip abduction x 12 reps yellow looped TB at thighs       Modalities   Modalities  Moist Heat      Moist Heat Therapy   Number Minutes Moist Heat  10 Minutes    Moist Heat Location  Hip   L      Manual Therapy   Manual Therapy  Soft tissue mobilization;Myofascial release;Passive ROM    Manual therapy comments  supine & R side lying with L LE supported on bolster    Soft tissue mobilization  STM/DTM to L TFL, glutes and piriformis - ttp in inferior glute max, L glute med,     Myofascial Release  manual TPR to L glute  medius and L piriformis     Passive ROM  Manual L TFL, ITB, HS, glute stretch      Prosthetics   Prosthetic Care Comments                 PT Education - 08/23/19 1231    Education Details  HEP update;  seated hip abduction into yellow looped TB at knees    Person(s) Educated  Patient    Methods  Explanation;Demonstration;Verbal cues;Handout    Comprehension  Verbalized understanding;Returned demonstration;Verbal cues required       PT Short Term Goals - 08/19/19 1754      PT SHORT TERM GOAL #1   Title  Patient will be independent with initial HEP    Status  Achieved   08/15/19       PT Long Term Goals - 08/19/19 1754      PT LONG TERM GOAL #1   Title  Patient will be independent with ongoing/advanced HEP    Status  On-going    Target Date  09/23/19      PT LONG TERM GOAL #2   Title  Patient to demonstrate improved tissue quality and pliability with reduced pain    Status  On-going    Target Date  09/23/19      PT LONG TERM GOAL #3   Title  Patient will demonstrate improved B proximal LE strength to >/= 4 to 4+/5 for improved stability and ease of mobility    Status  On-going    Target Date  09/23/19      PT LONG TERM GOAL #4   Title  Patient will improve walking tolerance to >/= 3-4 miles w/o pain interference to allow resumption of normal daily activities    Status  On-going    Target Date  09/23/19      PT LONG TERM GOAL #5   Title  Patient will negotiate stairs reciprocally with normal step pattern w/o limitation due to L hip pain or weakness    Status  On-going    Target Date  09/23/19      PT LONG TERM GOAL #6   Title  Patient will be able to resume light jogging/running up to 2 miles w/o L hip pain    Status  On-going    Target Date  09/23/19            Plan - 08/23/19 1232    Clinical Impression Statement  Pt. reporting she got good initial relief from MT last session.  Continued tenderness in proximal L glutes and L TFL thus address with MT with some relief noted.   Gentle proximal hip strengthening performed to pt. tolerance.  Pt. deferring trial of iontophoresis today as she noted some relief from this past therapy visit.  Ended visit with instruction to avoid painful activities as possible with pt. verbalizing understanding.    Comorbidities  h/o L knee scope x 2 for meniscal debridement and cyst removal, Tibial tendinitis, Patellar tendinitis, R Achilles tendinitis, Injury of plantaris muscle or tendon, Tibial stress fracture, Pelvic stress fracture, Metatarsal stress fracture, Hammer toe, L trochanteric bursitis, L RCR, Asthma (mild intermittent asthma without complication), GERD, BPPV, Sensorineural hearing loss, Breast cancer, Basal cell carcinoma    Rehab Potential  Good    PT Frequency  2x / week    PT Treatment/Interventions  ADLs/Self Care Home Management;Cryotherapy;Electrical Stimulation;Moist Heat;Ultrasound;Gait training;Stair training;Functional mobility training;Therapeutic activities;Therapeutic exercise;Balance training;Neuromuscular re-education;Patient/family education;Manual techniques;Passive range of motion;Dry needling;Taping;Joint Manipulations  PT Next Visit Plan  L hip flexibilty/stretching, proximal LE strengthening including glute minumus and medius, manual STM/MFR, modalities PRN    Consulted and Agree with Plan of Care  Patient       Patient will benefit from skilled therapeutic intervention in order to improve the following deficits and impairments:  Abnormal gait, Decreased activity tolerance, Decreased knowledge of precautions, Decreased mobility, Decreased strength, Difficulty walking, Increased edema, Increased fascial restricitons, Increased muscle spasms, Impaired perceived functional ability, Pain  Visit Diagnosis: Pain in left hip  Difficulty in walking, not elsewhere classified  Muscle weakness (generalized)  Other muscle spasm  Other symptoms and signs involving the musculoskeletal system     Problem  List Patient Active Problem List   Diagnosis Date Noted  . GERD (gastroesophageal reflux disease) 05/07/2019  . Wrist pain, acute, left 04/23/2019  . Knee pain, left 06/15/2017  . Rotator cuff tendinitis, left 09/08/2016  . Right foot pain 04/06/2016  . Injury of plantaris muscle or tendon 10/30/2014  . Achilles tendinitis of right lower extremity 06/19/2014  . Thrombocytopenia ([place]) 01/30/2014  . Benign paroxysmal positional vertigo 11/13/2013  . Insomnia 11/13/2013  . Sensorineural hearing loss 11/13/2013  . Tinnitus 11/13/2013  . Hyperlipidemia LDL goal <130 10/24/2013  . Mild intermittent asthma without complication [id number]  . Right knee pain 04/23/2013  . Patellar tendinitis 03/07/2013  . Muscle tear 11/15/2012  . Metatarsal stress fracture 08/29/2012  . Numbness and tingling of foot 03/29/2012  . Ankle pain 08/17/2011  . Chest pain in adult 08/17/2011  . TROCHANTERIC BURSITIS, LEFT 04/27/2010  . LEG PAIN, RIGHT 09/22/2009  . TIBIALIS TENDINITIS 08/12/2009  . STRESS FRACTURE, TIBIA 06/15/2009  . UNEQUAL LEG LENGTH 09/18/2008  . CARCINOMA, BASAL CELL 02/07/2008  . Malignant neoplasm of female breast ([place]) 02/07/2008  . STRESS FRACTURE OF PELVIS 08/29/2007  . HAMMER TOE, OTHER, ACQUIRED 10/13/2006  . ALLERGIC RHINITIS 10/12/2006  . Asthma 10/12/2006  . Allergic rhinitis 10/12/2006    Bess Harvest, PTA 08/23/19 12:42 PM   [facility] [address]  [place], [place], [address] Phone: [phone]   Fax:  [phone]  Name: Rhonda [name] MRN: [id number] Date of Birth: 09/09/1950

## 2019-08-26 ENCOUNTER — Ambulatory Visit: Payer: 59 | Admitting: Physical Therapy

## 2019-08-26 ENCOUNTER — Other Ambulatory Visit: Payer: Self-pay

## 2019-08-26 ENCOUNTER — Encounter: Payer: Self-pay | Admitting: Physical Therapy

## 2019-08-26 DIAGNOSIS — M25552 Pain in left hip: Secondary | ICD-10-CM | POA: Diagnosis not present

## 2019-08-26 DIAGNOSIS — M62838 Other muscle spasm: Secondary | ICD-10-CM | POA: Diagnosis not present

## 2019-08-26 DIAGNOSIS — R262 Difficulty in walking, not elsewhere classified: Secondary | ICD-10-CM

## 2019-08-26 DIAGNOSIS — M6281 Muscle weakness (generalized): Secondary | ICD-10-CM

## 2019-08-26 DIAGNOSIS — R29898 Other symptoms and signs involving the musculoskeletal system: Secondary | ICD-10-CM

## 2019-08-26 DIAGNOSIS — S335XXA Sprain of ligaments of lumbar spine, initial encounter: Secondary | ICD-10-CM | POA: Diagnosis not present

## 2019-08-26 DIAGNOSIS — M79671 Pain in right foot: Secondary | ICD-10-CM | POA: Diagnosis not present

## 2019-08-26 NOTE — Therapy (Signed)
[facility] [address]  [place], [place], [address] Phone: [phone]   Fax:  [phone]  Physical Therapy Treatment  Patient Details  Name: Rhonda [name] MRN: [id number] Date of Birth: April 10, 1951 Referring Provider (PT): Karlton Lemon, MD   Encounter Date: 08/26/2019  PT End of Session - 08/26/19 1608    Visit Number  5    Number of Visits  12    Date for PT Re-Evaluation  09/23/19    Authorization Type  [facility]    PT Start Time  1608    PT Stop Time  1702    PT Time Calculation (min)  54 min    Activity Tolerance  Patient tolerated treatment well    Behavior During Therapy  [facility] for tasks assessed/performed       Past Medical History:  Diagnosis Date  . Achilles tendinitis of right lower extremity 06/19/2014   US showed minor changes in December 15  Most sxs seem to be enthesopathy  Note had reaction to NTG in past  Overview:  Overview:  US showed minor changes in December 15  Most sxs seem to be enthesopathy  Note had reaction to NTG in past  Last Assessment & Plan:  Recent irritation in the race appears to be a partial plantaris tear  This continues to give her some Achilles symptoms  I think a period  . ALLERGIC RHINITIS 10/12/2006   Qualifier: Diagnosis of  By: McDiarmid MD, Sherren Mocha    . Ankle pain 08/17/2011  . Asthma 10/12/2006   Qualifier: Diagnosis of  By: McDiarmid MD, Sherren Mocha    Overview:  Overview:  Qualifier: Diagnosis of  By: McDiarmid MD, Sherren Mocha   . CARCINOMA, BASAL CELL 02/07/2008   Qualifier: History of  By: Drue Flirt  MD, Merrily Brittle    Overview:  Overview:  Qualifier: History of  By: Drue Flirt  MD, Merrily Brittle   Overview:  Qualifier: History of  By: Drue Flirt  MD, Merrily Brittle   . Chest pain in adult 08/17/2011   This has worrisome features particularly with her level of exertion   . HAMMER TOE, OTHER, ACQUIRED 10/13/2006   Qualifier: Diagnosis of  By: McDiarmid MD, Sherren Mocha    Overview:  Overview:  Qualifier: Diagnosis of   By: McDiarmid MD, Sherren Mocha   . High cholesterol   . Hyperlipidemia LDL goal <130 10/24/2013  . Injury of plantaris muscle or tendon 10/30/2014  . Insomnia 11/13/2013  . Knee pain, left 06/15/2017  . LEG PAIN, RIGHT 09/22/2009   Qualifier: Diagnosis of  By: Oneida Alar MD, KARL    . Malignant neoplasm of female breast ([place]) 02/07/2008   Qualifier: History of  By: Drue Flirt  MD, Merrily Brittle    Overview:  Overview:  Qualifier: History of  By: Drue Flirt  MD, Merrily Brittle  Overview:  Overview:  Qualifier: History of  By: Drue Flirt  MD, Merrily Brittle   . Metatarsal stress fracture 08/29/2012   This is at the distal shaft of the fifth metatarsal left foot   . Mild intermittent asthma without complication 3/87/5643  . Muscle tear 11/15/2012   Left rectus femoris; also like tendon tear   . Numbness and tingling of foot 03/29/2012  . Patellar tendinitis 03/07/2013   I suspect this occurred because she did less crosstraining before this marathon and was probably hamstring dominant   . Right foot pain 04/06/2016  . Right knee pain 04/23/2013   04/23/13  ultrasound today RT knee  revealed no significant effusion The  quadriceps tendon is intact as is the patellar tendon The medial meniscus was normal The lateral meniscus had an area of calcification and a very small area of splitting along the midline to the posterior third of the joint line   . Rotator cuff tendinitis, left 09/08/2016   Overview:  Last Assessment & Plan:  Significant improvement  See OV summary  . Sensorineural hearing loss 11/13/2013  . Stress fracture of pelvis 08/29/2007   Qualifier: History of  By: Drue Flirt  MD, Merrily Brittle    . STRESS FRACTURE, TIBIA 06/15/2009   Qualifier: Diagnosis of  By: Drue Flirt  MD, Merrily Brittle    . Thrombocytopenia ([place]) 01/30/2014  . Tibialis tendinitis 08/12/2009   Qualifier: Diagnosis of  By: Oneida Alar MD, KARL    . Tinnitus 11/13/2013  . TROCHANTERIC BURSITIS, LEFT 04/27/2010   Qualifier: Diagnosis of  By: Ernestina Patches MD, Remo Lipps    . UNEQUAL LEG LENGTH  09/18/2008   Qualifier: Diagnosis of  By: Oneida Alar MD, KARL      Past Surgical History:  Procedure Laterality Date  . ABDOMINAL SURGERY    . ABDOMINAL SURGERY     for removal of abdominal mass-cancerous  . KNEE SURGERY    . MANDIBLE FRACTURE SURGERY    . NASAL SINUS SURGERY    . TONSILLECTOMY      There were no vitals filed for this visit.  Subjective Assessment - 08/26/19 1611    Subjective  Pt noting good benefit from manual therapy ("when you and Ronalee Belts dug to [place]") over last few vistis. Able to walk 2.5 miles and work in the yard over the weekend without limitation due to pain, but when she tried to run on Sat morning, she could not take the "pounding".    Patient Stated Goals  "get back to running w/o pain"    Currently in Pain?  Yes    Pain Score  2    1-2/10 with "everyday stuff" but still having occasional 8/10 sharp pain that typically resolves quickly   Pain Location  Hip    Pain Orientation  Left    Pain Descriptors / Indicators  --   "mild"   Pain Type  Acute pain    Pain Frequency  Intermittent                       [facility] Adult PT Treatment/Exercise - 08/26/19 1608      Exercises   Exercises  Knee/Hip      Knee/Hip Exercises: Stretches   Hip Flexor Stretch  Left;30 seconds;2 reps    Hip Flexor Stretch Limitations  manual mod thomas & mod thomas with strap    ITB Stretch  Left;30 seconds;2 reps    ITB Stretch Limitations  manual by PT - sidelying with hip extended & supine cross body    Piriformis Stretch  Left;30 seconds;2 reps    Piriformis Stretch Limitations  manual by PT - figure 4 to chest & KTOS      Knee/Hip Exercises: Aerobic   Elliptical  L2.5 x 6 min      Knee/Hip Exercises: Standing   Hip Flexion  Left;Right;10 reps;Stengthening;Knee straight    Hip Flexion Limitations  looped yellow TB at ankles    Hip Abduction  Left;10 reps;Stengthening;Knee straight    Abduction Limitations  looped yellow TB at ankles    Hip Extension   Left;10 reps;Stengthening;Knee straight    Extension Limitations  looped yellow TB at ankles - extension with slight abduction  to increase glute medius activation    Functional Squat  10 reps;3 seconds    Functional Squat Limitations  + yellow TB hip abduction isometric      Manual Therapy   Manual Therapy  Soft tissue mobilization;Myofascial release;Passive ROM    Manual therapy comments  supine & R side lying with L LE supported on bolster    Soft tissue mobilization  STM/DTM to L TFL, glutes & piriformis, lateral L quads & ITB - very ttp in glute medius and distal VL, taut bands noted in piriformis    Myofascial Release  manual TPR to L TFL, glute medius, piriformis and distal VL    Passive ROM  manual stretching as above             PT Education - 08/26/19 1702    Education Details  HEP update - standing yellow TB 3-way hip SLR, squat + yellow TB hip abduction isometric    Person(s) Educated  Patient    Methods  Explanation;Demonstration;Handout    Comprehension  Verbalized understanding;Returned demonstration;Need further instruction       PT Short Term Goals - 08/19/19 1754      PT SHORT TERM GOAL #1   Title  Patient will be independent with initial HEP    Status  Achieved   08/15/19       PT Long Term Goals - 08/26/19 1702      PT LONG TERM GOAL #1   Title  Patient will be independent with ongoing/advanced HEP    Status  On-going    Target Date  09/23/19      PT LONG TERM GOAL #2   Title  Patient to demonstrate improved tissue quality and pliability with reduced pain    Status  Partially Met    Target Date  09/23/19      PT LONG TERM GOAL #3   Title  Patient will demonstrate improved B proximal LE strength to >/= 4 to 4+/5 for improved stability and ease of mobility    Status  On-going    Target Date  09/23/19      PT LONG TERM GOAL #4   Title  Patient will improve walking tolerance to >/= 3-4 miles w/o pain interference to allow resumption of normal daily  activities    Status  On-going   08/26/19 - able to walk 2.5 miles w/o pain interference   Target Date  09/23/19      PT LONG TERM GOAL #5   Title  Patient will negotiate stairs reciprocally with normal step pattern w/o limitation due to L hip pain or weakness    Status  On-going    Target Date  09/23/19      PT LONG TERM GOAL #6   Title  Patient will be able to resume light jogging/running up to 2 miles w/o L hip pain    Status  On-going            Plan - 08/26/19 1618    Clinical Impression Statement  [name] reporting 70% reduction in pain with "daily stuff" (1-2/10 on average) which she attributes to manual STM/MRF/TPR but she still will experience brief sharp pain up to 8/10 when she tries to lift her leg to reposition in bed or when she tries to climb stairs reciprocally, although she notes she is able to climb stairs reciprocally in bear crawl position. She has been able to resume walking 2.5 miles but still unable to run due to increased  pain with "pounding" force of footfall. She has been keeping up with her stretching however strengthening exercises had been temporarily deferred from HEP as she was pushing through 30 reps with exercises even though pain became "almost unbearable" after 10 reps. Reintroduced light strengthening exercises today, with repeated cues to slow pace of exercises and reinforcing need to avoid pushing through pain, starting with whatever number of reps she can tolerate before pain starts to increase and staying at this level for a few days before very gradually attempting to increase reps/intensity. Up to this point she has deferred trying the ionto patch, wanting to she how she progresses with manual therapy and exercises, but thinks she will be ready to try the patch on her next visit. Possibility of kinesiotaping also discussed, but patient wanting to try ionto patch first.    Personal Factors and Comorbidities  Comorbidity 3+;Time since onset of  injury/illness/exacerbation;Past/Current Experience;Age;Profession;Fitness    Comorbidities  h/o L knee scope x 2 for meniscal debridement and cyst removal, Tibial tendinitis, Patellar tendinitis, R Achilles tendinitis, Injury of plantaris muscle or tendon, Tibial stress fracture, Pelvic stress fracture, Metatarsal stress fracture, Hammer toe, L trochanteric bursitis, L RCR, Asthma (mild intermittent asthma without complication), GERD, BPPV, Sensorineural hearing loss, Breast cancer, Basal cell carcinoma    Examination-Activity Limitations  Locomotion Level;Sleep;Stairs;Transfers    Examination-Participation Restrictions  Community Activity;Other    Rehab Potential  Good    PT Frequency  2x / week    PT Duration  6 weeks    PT Treatment/Interventions  ADLs/Self Care Home Management;Cryotherapy;Electrical Stimulation;Moist Heat;Ultrasound;Gait training;Stair training;Functional mobility training;Therapeutic activities;Therapeutic exercise;Balance training;Neuromuscular re-education;Patient/family education;Manual techniques;Passive range of motion;Dry needling;Taping;Joint Manipulations    PT Next Visit Plan  L hip flexibilty/stretching, proximal LE strengthening including glute minumus and medius, manual STM/MFR, modalities PRN    PT Home Exercise Plan  08/26/19 - standing yellow TB 3-way hip SLR, squat + yellow TB hip abduction isometric    Consulted and Agree with Plan of Care  Patient       Patient will benefit from skilled therapeutic intervention in order to improve the following deficits and impairments:  Abnormal gait, Decreased activity tolerance, Decreased knowledge of precautions, Decreased mobility, Decreased strength, Difficulty walking, Increased edema, Increased fascial restricitons, Increased muscle spasms, Impaired perceived functional ability, Pain  Visit Diagnosis: Pain in left hip  Difficulty in walking, not elsewhere classified  Muscle weakness (generalized)  Other muscle  spasm  Other symptoms and signs involving the musculoskeletal system     Problem List Patient Active Problem List   Diagnosis Date Noted  . GERD (gastroesophageal reflux disease) 05/07/2019  . Wrist pain, acute, left 04/23/2019  . Knee pain, left 06/15/2017  . Rotator cuff tendinitis, left 09/08/2016  . Right foot pain 04/06/2016  . Injury of plantaris muscle or tendon 10/30/2014  . Achilles tendinitis of right lower extremity 06/19/2014  . Thrombocytopenia ([facility]) 01/30/2014  . Benign paroxysmal positional vertigo 11/13/2013  . Insomnia 11/13/2013  . Sensorineural hearing loss 11/13/2013  . Tinnitus 11/13/2013  . Hyperlipidemia LDL goal <130 10/24/2013  . Mild intermittent asthma without complication 28/31/5176  . Right knee pain 04/23/2013  . Patellar tendinitis 03/07/2013  . Muscle tear 11/15/2012  . Metatarsal stress fracture 08/29/2012  . Numbness and tingling of foot 03/29/2012  . Ankle pain 08/17/2011  . Chest pain in adult 08/17/2011  . TROCHANTERIC BURSITIS, LEFT 04/27/2010  . LEG PAIN, RIGHT 09/22/2009  . TIBIALIS TENDINITIS 08/12/2009  . STRESS FRACTURE, TIBIA 06/15/2009  .  UNEQUAL LEG LENGTH 09/18/2008  . CARCINOMA, BASAL CELL 02/07/2008  . Malignant neoplasm of female breast ([place]) 02/07/2008  . STRESS FRACTURE OF PELVIS 08/29/2007  . HAMMER TOE, OTHER, ACQUIRED 10/13/2006  . ALLERGIC RHINITIS 10/12/2006  . Asthma 10/12/2006  . Allergic rhinitis 10/12/2006    Percival Spanish, PT, MPT 08/26/2019, 5:38 PM  [facility] [address]  [place], [place], [address] Phone: [phone]   Fax:  [phone]  Name: MAILA DUKES MRN: [id number] Date of Birth: Jul 31, 1950

## 2019-08-28 ENCOUNTER — Encounter: Payer: Self-pay | Admitting: Family Medicine

## 2019-08-28 ENCOUNTER — Other Ambulatory Visit: Payer: Self-pay

## 2019-08-28 ENCOUNTER — Ambulatory Visit: Payer: 59 | Admitting: Family Medicine

## 2019-08-28 VITALS — BP 126/84 | Ht 60.0 in | Wt 136.0 lb

## 2019-08-28 DIAGNOSIS — M25552 Pain in left hip: Secondary | ICD-10-CM | POA: Diagnosis not present

## 2019-08-28 NOTE — Progress Notes (Signed)
PCP: Loraine Leriche., MD  Subjective:   HPI: Patient is a 69 y.o. female here for left hip pain.  3/17: She was last seen on 07/24/2019 for her hip pain thought to be 2/2 proximal IT band syndrome or spasms of her TFL. Advised to do icing, home exercises and stretching, declined steroid injection at that time. Today she reports that her left hip pain has improved still having a lot of difficulty with walking upstairs or running.  She still having left hip pain with no radiation.  She tried running about 2 times since her last appointment, it was very painful and she found herself favoring her right leg when she ran, she had to do it in intervals.  She has been doing the icing but reports that this has not helped.  She reports that massages and foam rolling really helps.  She has been doing most of the exercises except one which caused her to have a lot of popping and cracking sounds in her hip. Denies any back pain, radiation, or numbness/tingling. She reports that she has not been able to train for the marathon and has had to cancel her marathon in May.  She wants to go back to her normal self.  4/21: Patient reports she's doing better than last visit. Has completed 5 visits of PT and they're slowly adding strengthening back to her rehab. Still trouble walking up steps. Can wake her up with sharp lateral left hip pain. No radiation. No numbness.  Past Medical History:  Diagnosis Date  . Achilles tendinitis of right lower extremity 06/19/2014   US showed minor changes in December 15  Most sxs seem to be enthesopathy  Note had reaction to NTG in past  Overview:  Overview:  US showed minor changes in December 15  Most sxs seem to be enthesopathy  Note had reaction to NTG in past  Last Assessment & Plan:  Recent irritation in the race appears to be a partial plantaris tear  This continues to give her some Achilles symptoms  I think a period  . ALLERGIC RHINITIS 10/12/2006   Qualifier:  Diagnosis of  By: McDiarmid MD, Sherren Mocha    . Ankle pain 08/17/2011  . Asthma 10/12/2006   Qualifier: Diagnosis of  By: McDiarmid MD, Sherren Mocha    Overview:  Overview:  Qualifier: Diagnosis of  By: McDiarmid MD, Sherren Mocha   . CARCINOMA, BASAL CELL 02/07/2008   Qualifier: History of  By: Drue Flirt  MD, Merrily Brittle    Overview:  Overview:  Qualifier: History of  By: Drue Flirt  MD, Merrily Brittle   Overview:  Qualifier: History of  By: Drue Flirt  MD, Merrily Brittle   . Chest pain in adult 08/17/2011   This has worrisome features particularly with her level of exertion   . HAMMER TOE, OTHER, ACQUIRED 10/13/2006   Qualifier: Diagnosis of  By: McDiarmid MD, Sherren Mocha    Overview:  Overview:  Qualifier: Diagnosis of  By: McDiarmid MD, Sherren Mocha   . High cholesterol   . Hyperlipidemia LDL goal <130 10/24/2013  . Injury of plantaris muscle or tendon 10/30/2014  . Insomnia 11/13/2013  . Knee pain, left 06/15/2017  . LEG PAIN, RIGHT 09/22/2009   Qualifier: Diagnosis of  By: Oneida Alar MD, KARL    . Malignant neoplasm of female breast ([place]) 02/07/2008   Qualifier: History of  By: Drue Flirt  MD, Merrily Brittle    Overview:  Overview:  Qualifier: History of  By: Drue Flirt  MD, Merrily Brittle  Overview:  Overview:  Qualifier:  History of  By: Drue Flirt  MD, Merrily Brittle   . Metatarsal stress fracture 08/29/2012   This is at the distal shaft of the fifth metatarsal left foot   . Mild intermittent asthma without complication [id number]  . Muscle tear 11/15/2012   Left rectus femoris; also like tendon tear   . Numbness and tingling of foot 03/29/2012  . Patellar tendinitis 03/07/2013   I suspect this occurred because she did less crosstraining before this marathon and was probably hamstring dominant   . Right foot pain 04/06/2016  . Right knee pain 04/23/2013   04/23/13  ultrasound today RT knee  revealed no significant effusion The quadriceps tendon is intact as is the patellar tendon The medial meniscus was normal The lateral meniscus had an area of calcification and a very small area of  splitting along the midline to the posterior third of the joint line   . Rotator cuff tendinitis, left 09/08/2016   Overview:  Last Assessment & Plan:  Significant improvement  See OV summary  . Sensorineural hearing loss 11/13/2013  . Stress fracture of pelvis 08/29/2007   Qualifier: History of  By: Drue Flirt  MD, Merrily Brittle    . STRESS FRACTURE, TIBIA 06/15/2009   Qualifier: Diagnosis of  By: Drue Flirt  MD, Merrily Brittle    . Thrombocytopenia ([place]) 01/30/2014  . Tibialis tendinitis 08/12/2009   Qualifier: Diagnosis of  By: Oneida Alar MD, KARL    . Tinnitus 11/13/2013  . TROCHANTERIC BURSITIS, LEFT 04/27/2010   Qualifier: Diagnosis of  By: Ernestina Patches MD, Remo Lipps    . UNEQUAL LEG LENGTH 09/18/2008   Qualifier: Diagnosis of  By: Oneida Alar MD, KARL      Current Outpatient Medications on File Prior to Visit  Medication Sig Dispense Refill  . albuterol (PROVENTIL HFA) 108 (90 BASE) MCG/ACT inhaler Inhale 2 puffs into the lungs every 6 (six) hours as needed for wheezing.    Marland Kitchen omeprazole (PRILOSEC) 20 MG capsule Take 1 capsule by mouth as needed.    . rosuvastatin (CRESTOR) 10 MG tablet Take by mouth.     No current facility-administered medications on file prior to visit.    Past Surgical History:  Procedure Laterality Date  . ABDOMINAL SURGERY    . ABDOMINAL SURGERY     for removal of abdominal mass-cancerous  . KNEE SURGERY    . MANDIBLE FRACTURE SURGERY    . NASAL SINUS SURGERY    . TONSILLECTOMY      Allergies  Allergen Reactions  . Acetaminophen     REACTION: mild throat swelling if takes more than a few consecutive doses  . Aspirin     REACTION: anaphylaxis  . Nsaids     REACTION: face \\T \throat swelling, sneezing and difficulty breathing  . Pravastatin Other (See Comments)    "joint pains"    Social History   Socioeconomic History  . Marital status: Married    Spouse name: Not on file  . Number of children: Not on file  . Years of education: Not on file  . Highest education level: Not on file   Occupational History  . Not on file  Tobacco Use  . Smoking status: Never Smoker  . Smokeless tobacco: Never Used  Substance and Sexual Activity  . Alcohol use: Yes    Comment: occ glass of wine  . Drug use: No  . Sexual activity: Not on file  Other Topics Concern  . Not on file  Social History Narrative  . Not on file   Social  Determinants of Health   Financial Resource Strain:   . Difficulty of Paying Living Expenses:   Food Insecurity:   . Worried About Charity fundraiser in the Last Year:   . Arboriculturist in the Last Year:   Transportation Needs:   . Film/video editor (Medical):   Marland Kitchen Lack of Transportation (Non-Medical):   Physical Activity:   . Days of Exercise per Week:   . Minutes of Exercise per Session:   Stress:   . Feeling of Stress :   Social Connections:   . Frequency of Communication with Friends and Family:   . Frequency of Social Gatherings with Friends and Family:   . Attends Religious Services:   . Active Member of Clubs or Organizations:   . Attends Archivist Meetings:   Marland Kitchen Marital Status:   Intimate Partner Violence:   . Fear of Current or Ex-Partner:   . Emotionally Abused:   Marland Kitchen Physically Abused:   . Sexually Abused:     Family History  Problem Relation Age of Onset  . COPD Mother   . Pulmonary fibrosis Father   . Parkinson's disease Father   . Diabetes Father   . Thyroid disease Sister   . Diabetes Sister   . Heart attack Neg Hx     BP 126/84   Ht 5' (1.524 m)   Wt 136 lb (61.7 kg)   BMI 26.56 kg/m   Review of Systems: See HPI above.     Objective:  Physical Exam:  Gen: NAD, comfortable in exam room  Left hip: No deformity. FROM with 5/5 strength except 3/5 hip abduction. TTP superior to greater trochanter within glut med and minimus. NVI distally. Negative logroll. Negative faber and fadir.   Assessment & Plan:  1. Left hip pain - Pain more localized now to glut medius and minimus as primary  issues.  Still continuing with IT band, tensor fascia lata rehab.  Continue PT, home exercises.  Icing if needed.  F/u in 6 weeks.

## 2019-08-28 NOTE — Patient Instructions (Signed)
Continue with physical therapy and home exercises as you have been. Plan to follow up with me in 6 weeks for reevaluation.

## 2019-08-29 ENCOUNTER — Ambulatory Visit: Payer: 59

## 2019-08-29 DIAGNOSIS — S335XXA Sprain of ligaments of lumbar spine, initial encounter: Secondary | ICD-10-CM | POA: Diagnosis not present

## 2019-08-29 DIAGNOSIS — M25552 Pain in left hip: Secondary | ICD-10-CM | POA: Diagnosis not present

## 2019-08-29 DIAGNOSIS — M62838 Other muscle spasm: Secondary | ICD-10-CM

## 2019-08-29 DIAGNOSIS — R29898 Other symptoms and signs involving the musculoskeletal system: Secondary | ICD-10-CM

## 2019-08-29 DIAGNOSIS — R262 Difficulty in walking, not elsewhere classified: Secondary | ICD-10-CM | POA: Diagnosis not present

## 2019-08-29 DIAGNOSIS — M79671 Pain in right foot: Secondary | ICD-10-CM | POA: Diagnosis not present

## 2019-08-29 DIAGNOSIS — M6281 Muscle weakness (generalized): Secondary | ICD-10-CM | POA: Diagnosis not present

## 2019-08-29 NOTE — Therapy (Signed)
[facility] [address]  [place], [place], [address] Phone: [phone]   Fax:  [phone]  Physical Therapy Treatment  Patient Details  Name: Rhonda [name] MRN: [id number] Date of Birth: 1951-01-10 Referring Provider (PT): Karlton Lemon, MD   Encounter Date: 08/29/2019  PT End of Session - 08/29/19 1635    Visit Number  6    Number of Visits  12    Date for PT Re-Evaluation  09/23/19    Authorization Type  [facility]    PT Start Time  6803    PT Stop Time  1700    PT Time Calculation (min)  47 min    Activity Tolerance  Patient tolerated treatment well    Behavior During Therapy  [facility] for tasks assessed/performed       Past Medical History:  Diagnosis Date  . Achilles tendinitis of right lower extremity 06/19/2014   US showed minor changes in December 15  Most sxs seem to be enthesopathy  Note had reaction to NTG in past  Overview:  Overview:  US showed minor changes in December 15  Most sxs seem to be enthesopathy  Note had reaction to NTG in past  Last Assessment & Plan:  Recent irritation in the race appears to be a partial plantaris tear  This continues to give her some Achilles symptoms  I think a period  . ALLERGIC RHINITIS 10/12/2006   Qualifier: Diagnosis of  By: McDiarmid MD, Sherren Mocha    . Ankle pain 08/17/2011  . Asthma 10/12/2006   Qualifier: Diagnosis of  By: McDiarmid MD, Sherren Mocha    Overview:  Overview:  Qualifier: Diagnosis of  By: McDiarmid MD, Sherren Mocha   . CARCINOMA, BASAL CELL 02/07/2008   Qualifier: History of  By: Drue Flirt  MD, Merrily Brittle    Overview:  Overview:  Qualifier: History of  By: Drue Flirt  MD, Merrily Brittle   Overview:  Qualifier: History of  By: Drue Flirt  MD, Merrily Brittle   . Chest pain in adult 08/17/2011   This has worrisome features particularly with her level of exertion   . HAMMER TOE, OTHER, ACQUIRED 10/13/2006   Qualifier: Diagnosis of  By: McDiarmid MD, Sherren Mocha    Overview:  Overview:  Qualifier: Diagnosis of   By: McDiarmid MD, Sherren Mocha   . High cholesterol   . Hyperlipidemia LDL goal <130 10/24/2013  . Injury of plantaris muscle or tendon 10/30/2014  . Insomnia 11/13/2013  . Knee pain, left 06/15/2017  . LEG PAIN, RIGHT 09/22/2009   Qualifier: Diagnosis of  By: Oneida Alar MD, KARL    . Malignant neoplasm of female breast ([place]) 02/07/2008   Qualifier: History of  By: Drue Flirt  MD, Merrily Brittle    Overview:  Overview:  Qualifier: History of  By: Drue Flirt  MD, Merrily Brittle  Overview:  Overview:  Qualifier: History of  By: Drue Flirt  MD, Merrily Brittle   . Metatarsal stress fracture 08/29/2012   This is at the distal shaft of the fifth metatarsal left foot   . Mild intermittent asthma without complication 06/20/2480  . Muscle tear 11/15/2012   Left rectus femoris; also like tendon tear   . Numbness and tingling of foot 03/29/2012  . Patellar tendinitis 03/07/2013   I suspect this occurred because she did less crosstraining before this marathon and was probably hamstring dominant   . Right foot pain 04/06/2016  . Right knee pain 04/23/2013   04/23/13  ultrasound today RT knee  revealed no significant effusion The  quadriceps tendon is intact as is the patellar tendon The medial meniscus was normal The lateral meniscus had an area of calcification and a very small area of splitting along the midline to the posterior third of the joint line   . Rotator cuff tendinitis, left 09/08/2016   Overview:  Last Assessment & Plan:  Significant improvement  See OV summary  . Sensorineural hearing loss 11/13/2013  . Stress fracture of pelvis 08/29/2007   Qualifier: History of  By: Drue Flirt  MD, Merrily Brittle    . STRESS FRACTURE, TIBIA 06/15/2009   Qualifier: Diagnosis of  By: Drue Flirt  MD, Merrily Brittle    . Thrombocytopenia ([place]) 01/30/2014  . Tibialis tendinitis 08/12/2009   Qualifier: Diagnosis of  By: Oneida Alar MD, KARL    . Tinnitus 11/13/2013  . TROCHANTERIC BURSITIS, LEFT 04/27/2010   Qualifier: Diagnosis of  By: Ernestina Patches MD, Remo Lipps    . UNEQUAL LEG LENGTH  09/18/2008   Qualifier: Diagnosis of  By: Oneida Alar MD, KARL      Past Surgical History:  Procedure Laterality Date  . ABDOMINAL SURGERY    . ABDOMINAL SURGERY     for removal of abdominal mass-cancerous  . KNEE SURGERY    . MANDIBLE FRACTURE SURGERY    . NASAL SINUS SURGERY    . TONSILLECTOMY      There were no vitals filed for this visit.  Subjective Assessment - 08/29/19 1619    Subjective  Pt. noting she is discouraged by hip soreness she had this morning.    Patient Stated Goals  "get back to running w/o pain"    Currently in Pain?  No/denies    Pain Score  0-No pain   L hip pain up to a 5/10   Pain Location  Hip    Pain Orientation  Left    Pain Descriptors / Indicators  Dull;Aching    Pain Type  Acute pain    Pain Radiating Towards  intemittently down L lateral shin    Pain Onset  More than a month ago    Pain Frequency  Intermittent    Multiple Pain Sites  No                       [facility] Adult PT Treatment/Exercise - 08/29/19 0001      Knee/Hip Exercises: Stretches   Passive Hamstring Stretch  Left;1 rep;30 seconds    Passive Hamstring Stretch Limitations  supine with strap    Quad Stretch  Left;2 reps;30 seconds    Quad Stretch Limitations  prone with strap    ITB Stretch  Left;30 seconds;2 reps    ITB Stretch Limitations  strap    Piriformis Stretch  Left;30 seconds;2 reps    Piriformis Stretch Limitations  KTOS    Other Knee/Hip Stretches  L Figure-4 stretch x 30 sec       Knee/Hip Exercises: Aerobic   Nustep  Lvl 4, 6 min (LEs,UE)      Knee/Hip Exercises: Standing   Hip Flexion  Left;Right;Stengthening;Knee straight;5 reps    Hip Flexion Limitations  yellow TB at ankle; 2 ski poles     Hip ADduction  Right;Left;5 reps;Strengthening    Hip ADduction Limitations  yellow TB at ankle; 2 ski poles     Hip Abduction  Right;Left;5 reps    Abduction Limitations  yellow TB at ankle; 2 ski poles     Hip Extension  Left;Right;Stengthening;5  reps;Knee straight    Extension Limitations  yellow TB  at ankle; 2 ski poles       Modalities   Modalities  Iontophoresis      Iontophoresis   Type of Iontophoresis  Dexamethasone    Location  L TFL/GT    Dose  14m-min, 1.015m   Time  4-6hours wear time                PT Short Term Goals - 08/19/19 1754      PT SHORT TERM GOAL #1   Title  Patient will be independent with initial HEP    Status  Achieved   08/15/19       PT Long Term Goals - 08/26/19 1702      PT LONG TERM GOAL #1   Title  Patient will be independent with ongoing/advanced HEP    Status  On-going    Target Date  09/23/19      PT LONG TERM GOAL #2   Title  Patient to demonstrate improved tissue quality and pliability with reduced pain    Status  Partially Met    Target Date  09/23/19      PT LONG TERM GOAL #3   Title  Patient will demonstrate improved B proximal LE strength to >/= 4 to 4+/5 for improved stability and ease of mobility    Status  On-going    Target Date  09/23/19      PT LONG TERM GOAL #4   Title  Patient will improve walking tolerance to >/= 3-4 miles w/o pain interference to allow resumption of normal daily activities    Status  On-going   08/26/19 - able to walk 2.5 miles w/o pain interference   Target Date  09/23/19      PT LONG TERM GOAL #5   Title  Patient will negotiate stairs reciprocally with normal step pattern w/o limitation due to L hip pain or weakness    Status  On-going    Target Date  09/23/19      PT LONG TERM GOAL #6   Title  Patient will be able to resume light jogging/running up to 2 miles w/o L hip pain    Status  On-going            Plan - 08/29/19 1636    Clinical Impression Statement  Rhonda Pulseeporting increased L hip pain after prolonged "working with manure" in prep for her garden last night.  Saw MD with MD providing her some encouragement for possible half-marathon in October.  Progressed LE stretching and performed low reps on 4-way standing  yellow TB hip kicker for improved proximal LE strength.  Ended visit with Rhonda Pulseeporting she wishes to try iontophoresis patch today.  Pt. has previously been issued into educational handout including precautions, contraindications and proper wear time and verbalized understanding of this today.  Ionto patch #1/6 applied to L TFL/GT area over point of most ttp.  Will monitor response to this patch in coming sessions.    Comorbidities  h/o L knee scope x 2 for meniscal debridement and cyst removal, Tibial tendinitis, Patellar tendinitis, R Achilles tendinitis, Injury of plantaris muscle or tendon, Tibial stress fracture, Pelvic stress fracture, Metatarsal stress fracture, Hammer toe, L trochanteric bursitis, L RCR, Asthma (mild intermittent asthma without complication), GERD, BPPV, Sensorineural hearing loss, Breast cancer, Basal cell carcinoma    Rehab Potential  Good    PT Treatment/Interventions  ADLs/Self Care Home Management;Cryotherapy;Electrical Stimulation;Moist Heat;Ultrasound;Gait training;Stair training;Functional mobility training;Therapeutic activities;Therapeutic exercise;Balance training;Neuromuscular re-education;Patient/family education;Manual techniques;Passive  range of motion;Dry needling;Taping;Joint Manipulations    PT Next Visit Plan  L hip flexibilty/stretching, proximal LE strengthening including glute minumus and medius, manual STM/MFR, modalities PRN    PT Home Exercise Plan  08/26/19 - standing yellow TB 3-way hip SLR, squat + yellow TB hip abduction isometric    Consulted and Agree with Plan of Care  Patient       Patient will benefit from skilled therapeutic intervention in order to improve the following deficits and impairments:  Abnormal gait, Decreased activity tolerance, Decreased knowledge of precautions, Decreased mobility, Decreased strength, Difficulty walking, Increased edema, Increased fascial restricitons, Increased muscle spasms, Impaired perceived functional  ability, Pain  Visit Diagnosis: Pain in left hip  Difficulty in walking, not elsewhere classified  Muscle weakness (generalized)  Other muscle spasm  Other symptoms and signs involving the musculoskeletal system     Problem List Patient Active Problem List   Diagnosis Date Noted  . GERD (gastroesophageal reflux disease) 05/07/2019  . Wrist pain, acute, left 04/23/2019  . Knee pain, left 06/15/2017  . Rotator cuff tendinitis, left 09/08/2016  . Right foot pain 04/06/2016  . Injury of plantaris muscle or tendon 10/30/2014  . Achilles tendinitis of right lower extremity 06/19/2014  . Thrombocytopenia ([place]) 01/30/2014  . Benign paroxysmal positional vertigo 11/13/2013  . Insomnia 11/13/2013  . Sensorineural hearing loss 11/13/2013  . Tinnitus 11/13/2013  . Hyperlipidemia LDL goal <130 10/24/2013  . Mild intermittent asthma without complication 15/52/0802  . Right knee pain 04/23/2013  . Patellar tendinitis 03/07/2013  . Muscle tear 11/15/2012  . Metatarsal stress fracture 08/29/2012  . Numbness and tingling of foot 03/29/2012  . Ankle pain 08/17/2011  . Chest pain in adult 08/17/2011  . TROCHANTERIC BURSITIS, LEFT 04/27/2010  . LEG PAIN, RIGHT 09/22/2009  . TIBIALIS TENDINITIS 08/12/2009  . STRESS FRACTURE, TIBIA 06/15/2009  . UNEQUAL LEG LENGTH 09/18/2008  . CARCINOMA, BASAL CELL 02/07/2008  . Malignant neoplasm of female breast ([place]) 02/07/2008  . STRESS FRACTURE OF PELVIS 08/29/2007  . HAMMER TOE, OTHER, ACQUIRED 10/13/2006  . ALLERGIC RHINITIS 10/12/2006  . Asthma 10/12/2006  . Allergic rhinitis 10/12/2006    Bess Harvest, PTA 08/29/19 6:17 PM   [facility] [address]  [place], [place], [address] Phone: [phone]   Fax:  [phone]  Name: Rhonda [name] MRN: [id number] Date of Birth: 08/15/1950

## 2019-09-02 ENCOUNTER — Ambulatory Visit: Payer: 59

## 2019-09-02 ENCOUNTER — Other Ambulatory Visit: Payer: Self-pay

## 2019-09-02 DIAGNOSIS — R29898 Other symptoms and signs involving the musculoskeletal system: Secondary | ICD-10-CM

## 2019-09-02 DIAGNOSIS — S335XXA Sprain of ligaments of lumbar spine, initial encounter: Secondary | ICD-10-CM | POA: Diagnosis not present

## 2019-09-02 DIAGNOSIS — R262 Difficulty in walking, not elsewhere classified: Secondary | ICD-10-CM

## 2019-09-02 DIAGNOSIS — M79671 Pain in right foot: Secondary | ICD-10-CM | POA: Diagnosis not present

## 2019-09-02 DIAGNOSIS — M25552 Pain in left hip: Secondary | ICD-10-CM | POA: Diagnosis not present

## 2019-09-02 DIAGNOSIS — M6281 Muscle weakness (generalized): Secondary | ICD-10-CM

## 2019-09-02 DIAGNOSIS — M62838 Other muscle spasm: Secondary | ICD-10-CM | POA: Diagnosis not present

## 2019-09-02 NOTE — Therapy (Signed)
[facility] [address]  [place], [place], [address] Phone: [phone]   Fax:  [phone]  Physical Therapy Treatment  Patient Details  Name: Rhonda [name] MRN: [id number] Date of Birth: 27-Jun-1950 Referring Provider (PT): Karlton Lemon, MD   Encounter Date: 09/02/2019  PT End of Session - 09/02/19 1623    Visit Number  7    Number of Visits  12    Date for PT Re-Evaluation  09/23/19    Authorization Type  [facility]    PT Start Time  1617    PT Stop Time  1704    PT Time Calculation (min)  47 min    Activity Tolerance  Patient tolerated treatment well    Behavior During Therapy  [facility] for tasks assessed/performed       Past Medical History:  Diagnosis Date  . Achilles tendinitis of right lower extremity 06/19/2014   US showed minor changes in December 15  Most sxs seem to be enthesopathy  Note had reaction to NTG in past  Overview:  Overview:  US showed minor changes in December 15  Most sxs seem to be enthesopathy  Note had reaction to NTG in past  Last Assessment & Plan:  Recent irritation in the race appears to be a partial plantaris tear  This continues to give her some Achilles symptoms  I think a period  . ALLERGIC RHINITIS 10/12/2006   Qualifier: Diagnosis of  By: McDiarmid MD, Sherren Mocha    . Ankle pain 08/17/2011  . Asthma 10/12/2006   Qualifier: Diagnosis of  By: McDiarmid MD, Sherren Mocha    Overview:  Overview:  Qualifier: Diagnosis of  By: McDiarmid MD, Sherren Mocha   . CARCINOMA, BASAL CELL 02/07/2008   Qualifier: History of  By: Drue Flirt  MD, Merrily Brittle    Overview:  Overview:  Qualifier: History of  By: Drue Flirt  MD, Merrily Brittle   Overview:  Qualifier: History of  By: Drue Flirt  MD, Merrily Brittle   . Chest pain in adult 08/17/2011   This has worrisome features particularly with her level of exertion   . HAMMER TOE, OTHER, ACQUIRED 10/13/2006   Qualifier: Diagnosis of  By: McDiarmid MD, Sherren Mocha    Overview:  Overview:  Qualifier: Diagnosis of   By: McDiarmid MD, Sherren Mocha   . High cholesterol   . Hyperlipidemia LDL goal <130 10/24/2013  . Injury of plantaris muscle or tendon 10/30/2014  . Insomnia 11/13/2013  . Knee pain, left 06/15/2017  . LEG PAIN, RIGHT 09/22/2009   Qualifier: Diagnosis of  By: Oneida Alar MD, KARL    . Malignant neoplasm of female breast ([place]) 02/07/2008   Qualifier: History of  By: Drue Flirt  MD, Merrily Brittle    Overview:  Overview:  Qualifier: History of  By: Drue Flirt  MD, Merrily Brittle  Overview:  Overview:  Qualifier: History of  By: Drue Flirt  MD, Merrily Brittle   . Metatarsal stress fracture 08/29/2012   This is at the distal shaft of the fifth metatarsal left foot   . Mild intermittent asthma without complication 9/50/9326  . Muscle tear 11/15/2012   Left rectus femoris; also like tendon tear   . Numbness and tingling of foot 03/29/2012  . Patellar tendinitis 03/07/2013   I suspect this occurred because she did less crosstraining before this marathon and was probably hamstring dominant   . Right foot pain 04/06/2016  . Right knee pain 04/23/2013   04/23/13  ultrasound today RT knee  revealed no significant effusion The  quadriceps tendon is intact as is the patellar tendon The medial meniscus was normal The lateral meniscus had an area of calcification and a very small area of splitting along the midline to the posterior third of the joint line   . Rotator cuff tendinitis, left 09/08/2016   Overview:  Last Assessment & Plan:  Significant improvement  See OV summary  . Sensorineural hearing loss 11/13/2013  . Stress fracture of pelvis 08/29/2007   Qualifier: History of  By: Drue Flirt  MD, Merrily Brittle    . STRESS FRACTURE, TIBIA 06/15/2009   Qualifier: Diagnosis of  By: Drue Flirt  MD, Merrily Brittle    . Thrombocytopenia ([place]) 01/30/2014  . Tibialis tendinitis 08/12/2009   Qualifier: Diagnosis of  By: Oneida Alar MD, KARL    . Tinnitus 11/13/2013  . TROCHANTERIC BURSITIS, LEFT 04/27/2010   Qualifier: Diagnosis of  By: Ernestina Patches MD, Remo Lipps    . UNEQUAL LEG LENGTH  09/18/2008   Qualifier: Diagnosis of  By: Oneida Alar MD, KARL      Past Surgical History:  Procedure Laterality Date  . ABDOMINAL SURGERY    . ABDOMINAL SURGERY     for removal of abdominal mass-cancerous  . KNEE SURGERY    . MANDIBLE FRACTURE SURGERY    . NASAL SINUS SURGERY    . TONSILLECTOMY      There were no vitals filed for this visit.  Subjective Assessment - 09/02/19 1620    Subjective  Pt. reporting some improvement in hip comfort climbing stairs with alternating pattern.    Patient Stated Goals  "get back to running w/o pain"    Currently in Pain?  No/denies    Pain Score  0-No pain   up to 8/10 still at times climbing stairs   Pain Location  Hip    Pain Orientation  Left    Pain Descriptors / Indicators  Sharp    Pain Type  Acute pain                       [facility] Adult PT Treatment/Exercise - 09/02/19 0001      Self-Care   Self-Care  Other Self-Care Comments    Other Self-Care Comments   Heavy education required to encourage pt. to avoid doing 2-3 sets of each strengthening HEP activity      Knee/Hip Exercises: Stretches   Passive Hamstring Stretch  Left;2 reps;30 seconds    Passive Hamstring Stretch Limitations  supine with strap    Quad Stretch  Left;2 reps;30 seconds    Quad Stretch Limitations  prone with strap    Hip Flexor Stretch  Left;1 rep;30 seconds    Hip Flexor Stretch Limitations  mod thomas with strap    ITB Stretch  Left;30 seconds;2 reps    ITB Stretch Limitations  strap    Piriformis Stretch  Left;30 seconds;2 reps    Piriformis Stretch Limitations  KTOS    Other Knee/Hip Stretches  L Figure-4 stretch x 30 sec       Knee/Hip Exercises: Aerobic   Nustep  Lvl 4, 6 min (LEs,UE)      Knee/Hip Exercises: Supine   Bridges with Clamshell  Both;15 reps;Strengthening   + isometric hip abd/ER into yellow TB at knees      Iontophoresis   Type of Iontophoresis  Dexamethasone    Location  L TFL/GT    Dose  40m-min, 1.020m  patch  #2/6   Time  4-6hours wear time  Manual Therapy   Manual Therapy  Soft tissue mobilization;Myofascial release    Manual therapy comments  R side lying with L LE supported on bolster    Soft tissue mobilization  STM/DTM to L TFL (ttp), L ITB,proximal quad, L glute med/max                PT Short Term Goals - 08/19/19 1754      PT SHORT TERM GOAL #1   Title  Patient will be independent with initial HEP    Status  Achieved   08/15/19       PT Long Term Goals - 08/26/19 1702      PT LONG TERM GOAL #1   Title  Patient will be independent with ongoing/advanced HEP    Status  On-going    Target Date  09/23/19      PT LONG TERM GOAL #2   Title  Patient to demonstrate improved tissue quality and pliability with reduced pain    Status  Partially Met    Target Date  09/23/19      PT LONG TERM GOAL #3   Title  Patient will demonstrate improved B proximal LE strength to >/= 4 to 4+/5 for improved stability and ease of mobility    Status  On-going    Target Date  09/23/19      PT LONG TERM GOAL #4   Title  Patient will improve walking tolerance to >/= 3-4 miles w/o pain interference to allow resumption of normal daily activities    Status  On-going   08/26/19 - able to walk 2.5 miles w/o pain interference   Target Date  09/23/19      PT LONG TERM GOAL #5   Title  Patient will negotiate stairs reciprocally with normal step pattern w/o limitation due to L hip pain or weakness    Status  On-going    Target Date  09/23/19      PT LONG TERM GOAL #6   Title  Patient will be able to resume light jogging/running up to 2 miles w/o L hip pain    Status  On-going            Plan - 09/02/19 1625    Clinical Impression Statement  Rhonda [name] reporting she has been working in her garden with some L hip pain and plans to till her garden over next few day.  With complaint today of L anterior/lateral hip pain in area of TFL - ttp with MT addressing increased tension throughout L hip  musculature.  Pt. still noting good relief from MT and notes some relief from trial of iontophoresis applied last session thus applied patch #2/6 today over area of TFL/GT.  LE stretching and gentle strengthening performed to pt. tolerance.  Pt. reporting she walked 2 miles earlier today and still admits to attempted to jog/run short distances with hip pain.  Questioning therapist today whether she can perform 2-3 sets of all LE strengthening activities and encouraged pt. to keep to 1 set of all strengthening activities as to prevent overexertion of hip musculature.  Pt. verbalizing understanding.  Pt. noting some improved hip comfort climbing stairs requiring less UE support.  Will monitor response to ionto patch and progress strengthening therex per pt. response in coming sessions.    Comorbidities  h/o L knee scope x 2 for meniscal debridement and cyst removal, Tibial tendinitis, Patellar tendinitis, R Achilles tendinitis, Injury of plantaris muscle or tendon, Tibial stress fracture, Pelvic  stress fracture, Metatarsal stress fracture, Hammer toe, L trochanteric bursitis, L RCR, Asthma (mild intermittent asthma without complication), GERD, BPPV, Sensorineural hearing loss, Breast cancer, Basal cell carcinoma    Rehab Potential  Good    PT Frequency  2x / week    PT Treatment/Interventions  ADLs/Self Care Home Management;Cryotherapy;Electrical Stimulation;Moist Heat;Ultrasound;Gait training;Stair training;Functional mobility training;Therapeutic activities;Therapeutic exercise;Balance training;Neuromuscular re-education;Patient/family education;Manual techniques;Passive range of motion;Dry needling;Taping;Joint Manipulations    PT Next Visit Plan  L hip flexibilty/stretching, proximal LE strengthening including glute minumus and medius, manual STM/MFR, modalities PRN    PT Home Exercise Plan  08/26/19 - standing yellow TB 3-way hip SLR, squat + yellow TB hip abduction isometric    Consulted and Agree with  Plan of Care  Patient       Patient will benefit from skilled therapeutic intervention in order to improve the following deficits and impairments:  Abnormal gait, Decreased activity tolerance, Decreased knowledge of precautions, Decreased mobility, Decreased strength, Difficulty walking, Increased edema, Increased fascial restricitons, Increased muscle spasms, Impaired perceived functional ability, Pain  Visit Diagnosis: Pain in left hip  Difficulty in walking, not elsewhere classified  Muscle weakness (generalized)  Other muscle spasm  Other symptoms and signs involving the musculoskeletal system     Problem List Patient Active Problem List   Diagnosis Date Noted  . GERD (gastroesophageal reflux disease) 05/07/2019  . Wrist pain, acute, left 04/23/2019  . Knee pain, left 06/15/2017  . Rotator cuff tendinitis, left 09/08/2016  . Right foot pain 04/06/2016  . Injury of plantaris muscle or tendon 10/30/2014  . Achilles tendinitis of right lower extremity 06/19/2014  . Thrombocytopenia ([place]) 01/30/2014  . Benign paroxysmal positional vertigo 11/13/2013  . Insomnia 11/13/2013  . Sensorineural hearing loss 11/13/2013  . Tinnitus 11/13/2013  . Hyperlipidemia LDL goal <130 10/24/2013  . Mild intermittent asthma without complication 96/72/8979  . Right knee pain 04/23/2013  . Patellar tendinitis 03/07/2013  . Muscle tear 11/15/2012  . Metatarsal stress fracture 08/29/2012  . Numbness and tingling of foot 03/29/2012  . Ankle pain 08/17/2011  . Chest pain in adult 08/17/2011  . TROCHANTERIC BURSITIS, LEFT 04/27/2010  . LEG PAIN, RIGHT 09/22/2009  . TIBIALIS TENDINITIS 08/12/2009  . STRESS FRACTURE, TIBIA 06/15/2009  . UNEQUAL LEG LENGTH 09/18/2008  . CARCINOMA, BASAL CELL 02/07/2008  . Malignant neoplasm of female breast ([place]) 02/07/2008  . STRESS FRACTURE OF PELVIS 08/29/2007  . HAMMER TOE, OTHER, ACQUIRED 10/13/2006  . ALLERGIC RHINITIS 10/12/2006  . Asthma 10/12/2006   . Allergic rhinitis 10/12/2006    Rhonda [name], PTA 09/02/19 [facility] [address]  [place], [place], [address] Phone: [phone]   Fax:  [phone]  Name: Rhonda [name] MRN: [id number] Date of Birth: 05/03/1951

## 2019-09-04 ENCOUNTER — Ambulatory Visit: Payer: 59

## 2019-09-04 ENCOUNTER — Other Ambulatory Visit: Payer: Self-pay

## 2019-09-04 DIAGNOSIS — M25552 Pain in left hip: Secondary | ICD-10-CM

## 2019-09-04 DIAGNOSIS — R262 Difficulty in walking, not elsewhere classified: Secondary | ICD-10-CM | POA: Diagnosis not present

## 2019-09-04 DIAGNOSIS — M6281 Muscle weakness (generalized): Secondary | ICD-10-CM | POA: Diagnosis not present

## 2019-09-04 DIAGNOSIS — R29898 Other symptoms and signs involving the musculoskeletal system: Secondary | ICD-10-CM | POA: Diagnosis not present

## 2019-09-04 DIAGNOSIS — M79671 Pain in right foot: Secondary | ICD-10-CM | POA: Diagnosis not present

## 2019-09-04 DIAGNOSIS — S335XXA Sprain of ligaments of lumbar spine, initial encounter: Secondary | ICD-10-CM | POA: Diagnosis not present

## 2019-09-04 DIAGNOSIS — M62838 Other muscle spasm: Secondary | ICD-10-CM

## 2019-09-04 DIAGNOSIS — B078 Other viral warts: Secondary | ICD-10-CM | POA: Diagnosis not present

## 2019-09-04 NOTE — Therapy (Signed)
[facility] [address]  [place], [place], [address] Phone: [phone]   Fax:  [phone]  Physical Therapy Treatment  Patient Details  Name: Rhonda [name] MRN: [id number] Date of Birth: July 15, 1950 Referring Provider (PT): Karlton Lemon, MD   Encounter Date: 09/04/2019  PT End of Session - 09/04/19 1711    Visit Number  8    Number of Visits  12    Date for PT Re-Evaluation  09/23/19    Authorization Type  [facility]    PT Start Time  1700    PT Stop Time  1751    PT Time Calculation (min)  51 min    Activity Tolerance  Patient tolerated treatment well    Behavior During Therapy  [facility] for tasks assessed/performed       Past Medical History:  Diagnosis Date  . Achilles tendinitis of right lower extremity 06/19/2014   US showed minor changes in December 15  Most sxs seem to be enthesopathy  Note had reaction to NTG in past  Overview:  Overview:  US showed minor changes in December 15  Most sxs seem to be enthesopathy  Note had reaction to NTG in past  Last Assessment & Plan:  Recent irritation in the race appears to be a partial plantaris tear  This continues to give her some Achilles symptoms  I think a period  . ALLERGIC RHINITIS 10/12/2006   Qualifier: Diagnosis of  By: McDiarmid MD, Sherren Mocha    . Ankle pain 08/17/2011  . Asthma 10/12/2006   Qualifier: Diagnosis of  By: McDiarmid MD, Sherren Mocha    Overview:  Overview:  Qualifier: Diagnosis of  By: McDiarmid MD, Sherren Mocha   . CARCINOMA, BASAL CELL 02/07/2008   Qualifier: History of  By: Drue Flirt  MD, Merrily Brittle    Overview:  Overview:  Qualifier: History of  By: Drue Flirt  MD, Merrily Brittle   Overview:  Qualifier: History of  By: Drue Flirt  MD, Merrily Brittle   . Chest pain in adult 08/17/2011   This has worrisome features particularly with her level of exertion   . HAMMER TOE, OTHER, ACQUIRED 10/13/2006   Qualifier: Diagnosis of  By: McDiarmid MD, Sherren Mocha    Overview:  Overview:  Qualifier: Diagnosis of   By: McDiarmid MD, Sherren Mocha   . High cholesterol   . Hyperlipidemia LDL goal <130 10/24/2013  . Injury of plantaris muscle or tendon 10/30/2014  . Insomnia 11/13/2013  . Knee pain, left 06/15/2017  . LEG PAIN, RIGHT 09/22/2009   Qualifier: Diagnosis of  By: Oneida Alar MD, KARL    . Malignant neoplasm of female breast ([place]) 02/07/2008   Qualifier: History of  By: Drue Flirt  MD, Merrily Brittle    Overview:  Overview:  Qualifier: History of  By: Drue Flirt  MD, Merrily Brittle  Overview:  Overview:  Qualifier: History of  By: Drue Flirt  MD, Merrily Brittle   . Metatarsal stress fracture 08/29/2012   This is at the distal shaft of the fifth metatarsal left foot   . Mild intermittent asthma without complication 1/93/7902  . Muscle tear 11/15/2012   Left rectus femoris; also like tendon tear   . Numbness and tingling of foot 03/29/2012  . Patellar tendinitis 03/07/2013   I suspect this occurred because she did less crosstraining before this marathon and was probably hamstring dominant   . Right foot pain 04/06/2016  . Right knee pain 04/23/2013   04/23/13  ultrasound today RT knee  revealed no significant effusion The  quadriceps tendon is intact as is the patellar tendon The medial meniscus was normal The lateral meniscus had an area of calcification and a very small area of splitting along the midline to the posterior third of the joint line   . Rotator cuff tendinitis, left 09/08/2016   Overview:  Last Assessment & Plan:  Significant improvement  See OV summary  . Sensorineural hearing loss 11/13/2013  . Stress fracture of pelvis 08/29/2007   Qualifier: History of  By: Drue Flirt  MD, Merrily Brittle    . STRESS FRACTURE, TIBIA 06/15/2009   Qualifier: Diagnosis of  By: Drue Flirt  MD, Merrily Brittle    . Thrombocytopenia ([place]) 01/30/2014  . Tibialis tendinitis 08/12/2009   Qualifier: Diagnosis of  By: Oneida Alar MD, KARL    . Tinnitus 11/13/2013  . TROCHANTERIC BURSITIS, LEFT 04/27/2010   Qualifier: Diagnosis of  By: Ernestina Patches MD, Remo Lipps    . UNEQUAL LEG LENGTH  09/18/2008   Qualifier: Diagnosis of  By: Oneida Alar MD, KARL      Past Surgical History:  Procedure Laterality Date  . ABDOMINAL SURGERY    . ABDOMINAL SURGERY     for removal of abdominal mass-cancerous  . KNEE SURGERY    . MANDIBLE FRACTURE SURGERY    . NASAL SINUS SURGERY    . TONSILLECTOMY      There were no vitals filed for this visit.  Subjective Assessment - 09/04/19 1708    Subjective  walked 2 miles earlier today.  Feels the iontophoresis helped to reduce her pain levels some.    Patient Stated Goals  "get back to running w/o pain"    Currently in Pain?  No/denies    Pain Score  0-No pain   soreness/"ache" rising to 2/10 at most when rising at night   Pain Location  Hip    Pain Orientation  Left    Pain Descriptors / Indicators  Sharp    Pain Type  Acute pain    Pain Onset  More than a month ago    Pain Frequency  Intermittent                       [facility] Adult PT Treatment/Exercise - 09/04/19 0001      Self-Care   Self-Care  Other Self-Care Comments    Other Self-Care Comments   Instruction in self ball massage to L glute med on wall       Knee/Hip Exercises: Stretches   Passive Hamstring Stretch  Left;30 seconds;1 rep    Passive Hamstring Stretch Limitations  supine with strap    Quad Stretch  Left;1 rep;30 seconds    Quad Stretch Limitations  prone with strap    Hip Flexor Stretch  Left;1 rep;30 seconds    Hip Flexor Stretch Limitations  mod thomas with strap    ITB Stretch  Left;30 seconds;1 rep    ITB Stretch Limitations  strap    Piriformis Stretch  Left;30 seconds;2 reps    Piriformis Stretch Limitations  KTOS      Knee/Hip Exercises: Aerobic   Recumbent Bike  lvl 2, 6 min      Knee/Hip Exercises: Standing   Other Standing Knee Exercises  Side stepping (25-30 dg hip flexion) with yellow TB at ankles 2 x 20 ft       Knee/Hip Exercises: Supine   Bridges with Clamshell  Both;15 reps;Strengthening   + isometric hip abd/ER into yellow  looped TB at knees      Knee/Hip  Exercises: Sidelying   Hip ABduction  Left;10 reps;Strengthening    Clams  L clam shell in R sidelying with yellow TB x 15 reps       Manual Therapy   Manual Therapy  Soft tissue mobilization;Myofascial release    Manual therapy comments  R side lying with L LE supported on bolster    Soft tissue mobilization  STM/DTM to L TFL, L glute med, piriformis, L VL/ITB - most ttp in L glute med     Myofascial Release  Manual TPR to L glute med              PT Education - 09/04/19 1802    Education Details  HEP update; self-glute massage on wall    Person(s) Educated  Patient    Methods  Explanation;Demonstration;Verbal cues;Handout    Comprehension  Verbalized understanding;Returned demonstration       PT Short Term Goals - 08/19/19 1754      PT SHORT TERM GOAL #1   Title  Patient will be independent with initial HEP    Status  Achieved   08/15/19       PT Long Term Goals - 08/26/19 1702      PT LONG TERM GOAL #1   Title  Patient will be independent with ongoing/advanced HEP    Status  On-going    Target Date  09/23/19      PT LONG TERM GOAL #2   Title  Patient to demonstrate improved tissue quality and pliability with reduced pain    Status  Partially Met    Target Date  09/23/19      PT LONG TERM GOAL #3   Title  Patient will demonstrate improved B proximal LE strength to >/= 4 to 4+/5 for improved stability and ease of mobility    Status  On-going    Target Date  09/23/19      PT LONG TERM GOAL #4   Title  Patient will improve walking tolerance to >/= 3-4 miles w/o pain interference to allow resumption of normal daily activities    Status  On-going   08/26/19 - able to walk 2.5 miles w/o pain interference   Target Date  09/23/19      PT LONG TERM GOAL #5   Title  Patient will negotiate stairs reciprocally with normal step pattern w/o limitation due to L hip pain or weakness    Status  On-going    Target Date  09/23/19      PT  LONG TERM GOAL #6   Title  Patient will be able to resume light jogging/running up to 2 miles w/o L hip pain    Status  On-going            Plan - 09/04/19 1756    Clinical Impression Statement  Pt. noting decreased ttp in L glutes today with MT and notes some relief from ionto patch applied last session.  Did note some soreness in L hip noticed when walking 2 miles today.  Tolerated progression of standing proximal hip strengthening well today.  Will consider further progression of standing hip strengthening per pt. response in coming sessions.    Comorbidities  h/o L knee scope x 2 for meniscal debridement and cyst removal, Tibial tendinitis, Patellar tendinitis, R Achilles tendinitis, Injury of plantaris muscle or tendon, Tibial stress fracture, Pelvic stress fracture, Metatarsal stress fracture, Hammer toe, L trochanteric bursitis, L RCR, Asthma (mild intermittent asthma without complication), GERD, BPPV, Sensorineural hearing  loss, Breast cancer, Basal cell carcinoma    Rehab Potential  Good    PT Frequency  2x / week    PT Treatment/Interventions  ADLs/Self Care Home Management;Cryotherapy;Electrical Stimulation;Moist Heat;Ultrasound;Gait training;Stair training;Functional mobility training;Therapeutic activities;Therapeutic exercise;Balance training;Neuromuscular re-education;Patient/family education;Manual techniques;Passive range of motion;Dry needling;Taping;Joint Manipulations    PT Next Visit Plan  Progress standing proximal hip strength; L hip flexibilty/stretching, proximal LE strengthening including glute minumus and medius, manual STM/MFR, modalities PRN    PT Home Exercise Plan  08/26/19 - standing yellow TB 3-way hip SLR, squat + yellow TB hip abduction isometric; 09/04/19 - glute ball massage with ball on wall    Consulted and Agree with Plan of Care  Patient       Patient will benefit from skilled therapeutic intervention in order to improve the following deficits and  impairments:  Abnormal gait, Decreased activity tolerance, Decreased knowledge of precautions, Decreased mobility, Decreased strength, Difficulty walking, Increased edema, Increased fascial restricitons, Increased muscle spasms, Impaired perceived functional ability, Pain  Visit Diagnosis: Pain in left hip  Difficulty in walking, not elsewhere classified  Muscle weakness (generalized)  Other muscle spasm  Other symptoms and signs involving the musculoskeletal system     Problem List Patient Active Problem List   Diagnosis Date Noted  . GERD (gastroesophageal reflux disease) 05/07/2019  . Wrist pain, acute, left 04/23/2019  . Knee pain, left 06/15/2017  . Rotator cuff tendinitis, left 09/08/2016  . Right foot pain 04/06/2016  . Injury of plantaris muscle or tendon 10/30/2014  . Achilles tendinitis of right lower extremity 06/19/2014  . Thrombocytopenia ([place]) 01/30/2014  . Benign paroxysmal positional vertigo 11/13/2013  . Insomnia 11/13/2013  . Sensorineural hearing loss 11/13/2013  . Tinnitus 11/13/2013  . Hyperlipidemia LDL goal <130 10/24/2013  . Mild intermittent asthma without complication 70/17/7939  . Right knee pain 04/23/2013  . Patellar tendinitis 03/07/2013  . Muscle tear 11/15/2012  . Metatarsal stress fracture 08/29/2012  . Numbness and tingling of foot 03/29/2012  . Ankle pain 08/17/2011  . Chest pain in adult 08/17/2011  . TROCHANTERIC BURSITIS, LEFT 04/27/2010  . LEG PAIN, RIGHT 09/22/2009  . TIBIALIS TENDINITIS 08/12/2009  . STRESS FRACTURE, TIBIA 06/15/2009  . UNEQUAL LEG LENGTH 09/18/2008  . CARCINOMA, BASAL CELL 02/07/2008  . Malignant neoplasm of female breast ([place]) 02/07/2008  . STRESS FRACTURE OF PELVIS 08/29/2007  . HAMMER TOE, OTHER, ACQUIRED 10/13/2006  . ALLERGIC RHINITIS 10/12/2006  . Asthma 10/12/2006  . Allergic rhinitis 10/12/2006   Bess Harvest, PTA 09/04/19 6:04 PM   [facility] [address]  [place], [place], [address] Phone: [phone]   Fax:  [phone]  Name: LUJUANA KAPLER MRN: [id number] Date of Birth: 1951/04/16

## 2019-09-09 ENCOUNTER — Encounter: Payer: Self-pay | Admitting: Physical Therapy

## 2019-09-09 ENCOUNTER — Other Ambulatory Visit: Payer: Self-pay

## 2019-09-09 ENCOUNTER — Ambulatory Visit: Payer: 59 | Attending: Family Medicine | Admitting: Physical Therapy

## 2019-09-09 DIAGNOSIS — M6281 Muscle weakness (generalized): Secondary | ICD-10-CM | POA: Diagnosis not present

## 2019-09-09 DIAGNOSIS — M62838 Other muscle spasm: Secondary | ICD-10-CM

## 2019-09-09 DIAGNOSIS — R262 Difficulty in walking, not elsewhere classified: Secondary | ICD-10-CM

## 2019-09-09 DIAGNOSIS — R29898 Other symptoms and signs involving the musculoskeletal system: Secondary | ICD-10-CM

## 2019-09-09 DIAGNOSIS — M25552 Pain in left hip: Secondary | ICD-10-CM

## 2019-09-09 NOTE — Patient Instructions (Signed)
    Home exercise program created by Fallou Hulbert, PT.  For questions, please contact Lane [facility] via phone at [phone] or email at [email or web address]  [facility] [address]  [address] [place], , [address] Phone: [phone]   Fax:  [phone]    

## 2019-09-09 NOTE — Therapy (Signed)
[facility] [address]  [place], [place], [address] Phone: [phone]   Fax:  [phone]  Physical Therapy Treatment  Patient Details  Name: Rhonda [name] MRN: [id number] Date of Birth: 1950-05-25 Referring Provider (PT): Karlton Lemon, MD   Encounter Date: 09/09/2019  PT End of Session - 09/09/19 1707    Visit Number  9    Number of Visits  12    Date for PT Re-Evaluation  09/23/19    Authorization Type  [facility]    Progress Note Due on Visit  12   previous PN sent on visit #5 for MD f/u appt   PT Start Time  1707    PT Stop Time  1756    PT Time Calculation (min)  49 min    Activity Tolerance  Patient tolerated treatment well    Behavior During Therapy  [facility] for tasks assessed/performed       Past Medical History:  Diagnosis Date  . Achilles tendinitis of right lower extremity 06/19/2014   US showed minor changes in December 15  Most sxs seem to be enthesopathy  Note had reaction to NTG in past  Overview:  Overview:  US showed minor changes in December 15  Most sxs seem to be enthesopathy  Note had reaction to NTG in past  Last Assessment & Plan:  Recent irritation in the race appears to be a partial plantaris tear  This continues to give her some Achilles symptoms  I think a period  . ALLERGIC RHINITIS 10/12/2006   Qualifier: Diagnosis of  By: McDiarmid MD, Sherren Mocha    . Ankle pain 08/17/2011  . Asthma 10/12/2006   Qualifier: Diagnosis of  By: McDiarmid MD, Sherren Mocha    Overview:  Overview:  Qualifier: Diagnosis of  By: McDiarmid MD, Sherren Mocha   . CARCINOMA, BASAL CELL 02/07/2008   Qualifier: History of  By: Drue Flirt  MD, Merrily Brittle    Overview:  Overview:  Qualifier: History of  By: Drue Flirt  MD, Merrily Brittle   Overview:  Qualifier: History of  By: Drue Flirt  MD, Merrily Brittle   . Chest pain in adult 08/17/2011   This has worrisome features particularly with her level of exertion   . HAMMER TOE, OTHER, ACQUIRED 10/13/2006   Qualifier:  Diagnosis of  By: McDiarmid MD, Sherren Mocha    Overview:  Overview:  Qualifier: Diagnosis of  By: McDiarmid MD, Sherren Mocha   . High cholesterol   . Hyperlipidemia LDL goal <130 10/24/2013  . Injury of plantaris muscle or tendon 10/30/2014  . Insomnia 11/13/2013  . Knee pain, left 06/15/2017  . LEG PAIN, RIGHT 09/22/2009   Qualifier: Diagnosis of  By: Oneida Alar MD, KARL    . Malignant neoplasm of female breast ([place]) 02/07/2008   Qualifier: History of  By: Drue Flirt  MD, Merrily Brittle    Overview:  Overview:  Qualifier: History of  By: Drue Flirt  MD, Merrily Brittle  Overview:  Overview:  Qualifier: History of  By: Drue Flirt  MD, Merrily Brittle   . Metatarsal stress fracture 08/29/2012   This is at the distal shaft of the fifth metatarsal left foot   . Mild intermittent asthma without complication 0/01/3817  . Muscle tear 11/15/2012   Left rectus femoris; also like tendon tear   . Numbness and tingling of foot 03/29/2012  . Patellar tendinitis 03/07/2013   I suspect this occurred because she did less crosstraining before this marathon and was probably hamstring dominant   . Right foot pain  04/06/2016  . Right knee pain 04/23/2013   04/23/13  ultrasound today RT knee  revealed no significant effusion The quadriceps tendon is intact as is the patellar tendon The medial meniscus was normal The lateral meniscus had an area of calcification and a very small area of splitting along the midline to the posterior third of the joint line   . Rotator cuff tendinitis, left 09/08/2016   Overview:  Last Assessment & Plan:  Significant improvement  See OV summary  . Sensorineural hearing loss 11/13/2013  . Stress fracture of pelvis 08/29/2007   Qualifier: History of  By: Drue Flirt  MD, Merrily Brittle    . STRESS FRACTURE, TIBIA 06/15/2009   Qualifier: Diagnosis of  By: Drue Flirt  MD, Merrily Brittle    . Thrombocytopenia ([place]) 01/30/2014  . Tibialis tendinitis 08/12/2009   Qualifier: Diagnosis of  By: Oneida Alar MD, KARL    . Tinnitus 11/13/2013  . TROCHANTERIC BURSITIS, LEFT  04/27/2010   Qualifier: Diagnosis of  By: Ernestina Patches MD, Remo Lipps    . UNEQUAL LEG LENGTH 09/18/2008   Qualifier: Diagnosis of  By: Oneida Alar MD, KARL      Past Surgical History:  Procedure Laterality Date  . ABDOMINAL SURGERY    . ABDOMINAL SURGERY     for removal of abdominal mass-cancerous  . KNEE SURGERY    . MANDIBLE FRACTURE SURGERY    . NASAL SINUS SURGERY    . TONSILLECTOMY      There were no vitals filed for this visit.  Subjective Assessment - 09/09/19 1711    Subjective  Pt reports improvement with ionto patches - "noting dramatic, but better". Notes ability to walk up the stairs once yesterday and again this morning w/o having to pull herself up the stairs, but on later attempt she had to pull on railing. Pt hopeful to start training for her next half-marathon in October by end of May.    Patient Stated Goals  "get back to running w/o pain"    Currently in Pain?  Yes    Pain Score  3    2-3/10   Pain Location  Buttocks    Pain Orientation  Left    Pain Descriptors / Indicators  --   "annoying - pain the butt"   Pain Type  Acute pain    Pain Frequency  Intermittent         [facility] PT Assessment - 09/09/19 1707      Assessment   Medical Diagnosis  L hip pain - TFL strain    Referring Provider (PT)  Karlton Lemon, MD    Next MD Visit  10/09/19                   [facility] Adult PT Treatment/Exercise - 09/09/19 1707      Knee/Hip Exercises: Stretches   ITB Stretch  Left;Right;30 seconds;2 reps    ITB Stretch Limitations  standing lateral lean - clarification of positioning of target leg to rear with crossed-leg positioning      Knee/Hip Exercises: Aerobic   Nustep  L5 x 6 min (UE/LE)    seat #4     Knee/Hip Exercises: Standing   Forward Lunges  Right;Left;10 reps;2 seconds    Forward Lunges Limitations  UE support on back of chair    Side Lunges  Right;Left;10 reps;3 seconds    Side Lunges Limitations  TRX    Wall Squat  10 reps;5 seconds    Wall Squat  Limitations  + alt red TB  hip ABD/ER      Knee/Hip Exercises: Prone   Hip Extension  Right;Left;10 reps;Strengthening    Hip Extension Limitations  quadruped bent knee hip extension    Straight Leg Raises  Right;Left;10 reps;Strengthening    Straight Leg Raises Limitations  quadruped hip extension + abduction    Other Prone Exercises  Quadruped fire hydrants 10 x 3 sec      Iontophoresis   Type of Iontophoresis  Dexamethasone    Location  L TFL/GT    Dose  62m-min, 1.057m   Time  4-6 hr patch (#3 of 6)      Manual Therapy   Manual Therapy  Other (comment)    Other Manual Therapy  Review of ball self-STM to glutes on wall as well as figure-4 seated alternative for lower glutes and piriformis. Pt also instructed in use of foam roller for glutes, piriformis and ITB for further sefl-STM.              PT Education - 09/09/19 1756    Education Details  Review of ITB stretches and self-STM for glutes, piriformis and ITB using ball and/or foam roller; HEP update - quadruped hip extension, ext/abd & fire hydrants, progression to red TB for existing HEP    Person(s) Educated  Patient    Methods  Explanation;Demonstration;Handout    Comprehension  Verbalized understanding;Returned demonstration;Need further instruction       PT Short Term Goals - 08/19/19 1754      PT SHORT TERM GOAL #1   Title  Patient will be independent with initial HEP    Status  Achieved   08/15/19       PT Long Term Goals - 09/09/19 1756      PT LONG TERM GOAL #1   Title  Patient will be independent with ongoing/advanced HEP    Status  Partially Met   09/09/19 - met for current HEP     PT LONG TERM GOAL #2   Title  Patient to demonstrate improved tissue quality and pliability with reduced pain    Status  Partially Met   09/09/19     PT LONG TERM GOAL #3   Title  Patient will demonstrate improved B proximal LE strength to >/= 4 to 4+/5 for improved stability and ease of mobility    Status  On-going       PT LONG TERM GOAL #4   Title  Patient will improve walking tolerance to >/= 3-4 miles w/o pain interference to allow resumption of normal daily activities    Status  On-going   08/26/19 - able to walk 2.5 miles w/o pain interference     PT LONG TERM GOAL #5   Title  Patient will negotiate stairs reciprocally with normal step pattern w/o limitation due to L hip pain or weakness    Status  Partially Met   09/09/19     PT LONG TERM GOAL #6   Title  Patient will be able to resume light jogging/running up to 2 miles w/o L hip pain    Status  On-going            Plan - 09/09/19 1715    Clinical Impression Statement  Rhonda [name] some improvement with ability to negotiate stairs reciprocally w/o having to pull herself up on the railing, but endurance remains limited to 1x/day. She reports she continues to trial running ~30 strides at a time - able to complete 1 set but on 2nd  attempt after 2-minute walk in between was unable to tolerate the "pounding" in her hip with each footfall on attempt Saturday.  Pt requesting clarification of positioning with standing ITB stretch with better understanding acknowledged after review. She also notes difficulty keeping ball in place for self-STM, therefore provided alternative positioning with ball in figure-4 sitting for lower glutes/piriformis as well as foam rolling for glutes, piriformis & ITB. Remainder of session focusing on progression of strengthening exercises with progression of TB resistance to red TB and introduction of quadruped glute/piriformis strengthening as well as wall squats + clams and lunges. Instructions for quadruped exercises provided for HEP with pt cautioned to alternate these with existing HEP exercises rather than add to total exercise program. Session concluded with 3rd application of ionto patch at pt's request due to benefit noted.    Personal Factors and Comorbidities  Comorbidity 3+;Time since onset of  injury/illness/exacerbation;Past/Current Experience;Age;Profession;Fitness    Comorbidities  h/o L knee scope x 2 for meniscal debridement and cyst removal, Tibial tendinitis, Patellar tendinitis, R Achilles tendinitis, Injury of plantaris muscle or tendon, Tibial stress fracture, Pelvic stress fracture, Metatarsal stress fracture, Hammer toe, L trochanteric bursitis, L RCR, Asthma (mild intermittent asthma without complication), GERD, BPPV, Sensorineural hearing loss, Breast cancer, Basal cell carcinoma    Examination-Activity Limitations  Locomotion Level;Sleep;Stairs;Transfers    Examination-Participation Restrictions  Community Activity;Other    Rehab Potential  Good    PT Frequency  2x / week    PT Duration  6 weeks    PT Treatment/Interventions  ADLs/Self Care Home Management;Cryotherapy;Electrical Stimulation;Moist Heat;Ultrasound;Gait training;Stair training;Functional mobility training;Therapeutic activities;Therapeutic exercise;Balance training;Neuromuscular re-education;Patient/family education;Manual techniques;Passive range of motion;Dry needling;Taping;Joint Manipulations    PT Next Visit Plan  Progress proximal LE strengthening including glute minumus and medius, L hip flexibilty/stretching, manual STM/MFR, modalities PRN    PT Home Exercise Plan  08/12/19 - standing & supine ITB stretches,  kneel hip flexor stretch, standing and sidelying glute med strengthening, yellow TB psoas march; 08/23/19 - seated yellow TB hip ABD; 08/26/19 - standing yellow TB 3-way hip SLR, squat + yellow TB hip abduction isometric; 09/04/19 - glute ball massage with ball on wall; 09/09/19 - quadruped hip extension, ext/abd & fire hydrants    Consulted and Agree with Plan of Care  Patient       Patient will benefit from skilled therapeutic intervention in order to improve the following deficits and impairments:  Abnormal gait, Decreased activity tolerance, Decreased knowledge of precautions, Decreased mobility,  Decreased strength, Difficulty walking, Increased edema, Increased fascial restricitons, Increased muscle spasms, Impaired perceived functional ability, Pain  Visit Diagnosis: Pain in left hip  Difficulty in walking, not elsewhere classified  Muscle weakness (generalized)  Other muscle spasm  Other symptoms and signs involving the musculoskeletal system     Problem List Patient Active Problem List   Diagnosis Date Noted  . GERD (gastroesophageal reflux disease) 05/07/2019  . Wrist pain, acute, left 04/23/2019  . Knee pain, left 06/15/2017  . Rotator cuff tendinitis, left 09/08/2016  . Right foot pain 04/06/2016  . Injury of plantaris muscle or tendon 10/30/2014  . Achilles tendinitis of right lower extremity 06/19/2014  . Thrombocytopenia ([place]) 01/30/2014  . Benign paroxysmal positional vertigo 11/13/2013  . Insomnia 11/13/2013  . Sensorineural hearing loss 11/13/2013  . Tinnitus 11/13/2013  . Hyperlipidemia LDL goal <130 10/24/2013  . Mild intermittent asthma without complication 64/33/2951  . Right knee pain 04/23/2013  . Patellar tendinitis 03/07/2013  . Muscle tear 11/15/2012  . Metatarsal  stress fracture 08/29/2012  . Numbness and tingling of foot 03/29/2012  . Ankle pain 08/17/2011  . Chest pain in adult 08/17/2011  . TROCHANTERIC BURSITIS, LEFT 04/27/2010  . LEG PAIN, RIGHT 09/22/2009  . TIBIALIS TENDINITIS 08/12/2009  . STRESS FRACTURE, TIBIA 06/15/2009  . UNEQUAL LEG LENGTH 09/18/2008  . CARCINOMA, BASAL CELL 02/07/2008  . Malignant neoplasm of female breast ([place]) 02/07/2008  . STRESS FRACTURE OF PELVIS 08/29/2007  . HAMMER TOE, OTHER, ACQUIRED 10/13/2006  . ALLERGIC RHINITIS 10/12/2006  . Asthma 10/12/2006  . Allergic rhinitis 10/12/2006    Percival Spanish, PT, MPT 09/09/2019, 6:40 PM  [facility] [address]  [place], [place], [address] Phone: [phone]   Fax:  [phone]  Name:  Rhonda [name] MRN: [id number] Date of Birth: July 05, 1950

## 2019-09-12 ENCOUNTER — Other Ambulatory Visit: Payer: Self-pay

## 2019-09-12 ENCOUNTER — Ambulatory Visit: Payer: 59

## 2019-09-12 DIAGNOSIS — R29898 Other symptoms and signs involving the musculoskeletal system: Secondary | ICD-10-CM | POA: Diagnosis not present

## 2019-09-12 DIAGNOSIS — R262 Difficulty in walking, not elsewhere classified: Secondary | ICD-10-CM

## 2019-09-12 DIAGNOSIS — M62838 Other muscle spasm: Secondary | ICD-10-CM

## 2019-09-12 DIAGNOSIS — M6281 Muscle weakness (generalized): Secondary | ICD-10-CM

## 2019-09-12 DIAGNOSIS — M25552 Pain in left hip: Secondary | ICD-10-CM

## 2019-09-12 NOTE — Therapy (Addendum)
[facility] [address]  [place], [place], [address] Phone: [phone]   Fax:  [phone]  Physical Therapy Treatment  Patient Details  Name: Rhonda [name] MRN: [id number] Date of Birth: 27-Nov-1950 Referring Provider (PT): Karlton Lemon, MD   Encounter Date: 09/12/2019  PT End of Session - 09/12/19 1456    Visit Number  10    Number of Visits  12    Date for PT Re-Evaluation  09/23/19    Authorization Type  [facility]    Progress Note Due on Visit  12   previous PN sent on visit #5 for MD f/u appt   PT Start Time  1445    PT Stop Time  1535    PT Time Calculation (min)  50 min    Activity Tolerance  Patient tolerated treatment well    Behavior During Therapy  [facility] for tasks assessed/performed       Past Medical History:  Diagnosis Date  . Achilles tendinitis of right lower extremity 06/19/2014   US showed minor changes in December 15  Most sxs seem to be enthesopathy  Note had reaction to NTG in past  Overview:  Overview:  US showed minor changes in December 15  Most sxs seem to be enthesopathy  Note had reaction to NTG in past  Last Assessment & Plan:  Recent irritation in the race appears to be a partial plantaris tear  This continues to give her some Achilles symptoms  I think a period  . ALLERGIC RHINITIS 10/12/2006   Qualifier: Diagnosis of  By: McDiarmid MD, Sherren Mocha    . Ankle pain 08/17/2011  . Asthma 10/12/2006   Qualifier: Diagnosis of  By: McDiarmid MD, Sherren Mocha    Overview:  Overview:  Qualifier: Diagnosis of  By: McDiarmid MD, Sherren Mocha   . CARCINOMA, BASAL CELL 02/07/2008   Qualifier: History of  By: Drue Flirt  MD, Merrily Brittle    Overview:  Overview:  Qualifier: History of  By: Drue Flirt  MD, Merrily Brittle   Overview:  Qualifier: History of  By: Drue Flirt  MD, Merrily Brittle   . Chest pain in adult 08/17/2011   This has worrisome features particularly with her level of exertion   . HAMMER TOE, OTHER, ACQUIRED 10/13/2006   Qualifier:  Diagnosis of  By: McDiarmid MD, Sherren Mocha    Overview:  Overview:  Qualifier: Diagnosis of  By: McDiarmid MD, Sherren Mocha   . High cholesterol   . Hyperlipidemia LDL goal <130 10/24/2013  . Injury of plantaris muscle or tendon 10/30/2014  . Insomnia 11/13/2013  . Knee pain, left 06/15/2017  . LEG PAIN, RIGHT 09/22/2009   Qualifier: Diagnosis of  By: Oneida Alar MD, KARL    . Malignant neoplasm of female breast ([place]) 02/07/2008   Qualifier: History of  By: Drue Flirt  MD, Merrily Brittle    Overview:  Overview:  Qualifier: History of  By: Drue Flirt  MD, Merrily Brittle  Overview:  Overview:  Qualifier: History of  By: Drue Flirt  MD, Merrily Brittle   . Metatarsal stress fracture 08/29/2012   This is at the distal shaft of the fifth metatarsal left foot   . Mild intermittent asthma without complication 07/16/4074  . Muscle tear 11/15/2012   Left rectus femoris; also like tendon tear   . Numbness and tingling of foot 03/29/2012  . Patellar tendinitis 03/07/2013   I suspect this occurred because she did less crosstraining before this marathon and was probably hamstring dominant   . Right foot pain  04/06/2016  . Right knee pain 04/23/2013   04/23/13  ultrasound today RT knee  revealed no significant effusion The quadriceps tendon is intact as is the patellar tendon The medial meniscus was normal The lateral meniscus had an area of calcification and a very small area of splitting along the midline to the posterior third of the joint line   . Rotator cuff tendinitis, left 09/08/2016   Overview:  Last Assessment & Plan:  Significant improvement  See OV summary  . Sensorineural hearing loss 11/13/2013  . Stress fracture of pelvis 08/29/2007   Qualifier: History of  By: Drue Flirt  MD, Merrily Brittle    . STRESS FRACTURE, TIBIA 06/15/2009   Qualifier: Diagnosis of  By: Drue Flirt  MD, Merrily Brittle    . Thrombocytopenia ([place]) 01/30/2014  . Tibialis tendinitis 08/12/2009   Qualifier: Diagnosis of  By: Oneida Alar MD, KARL    . Tinnitus 11/13/2013  . TROCHANTERIC BURSITIS, LEFT  04/27/2010   Qualifier: Diagnosis of  By: Ernestina Patches MD, Remo Lipps    . UNEQUAL LEG LENGTH 09/18/2008   Qualifier: Diagnosis of  By: Oneida Alar MD, KARL      Past Surgical History:  Procedure Laterality Date  . ABDOMINAL SURGERY    . ABDOMINAL SURGERY     for removal of abdominal mass-cancerous  . KNEE SURGERY    . MANDIBLE FRACTURE SURGERY    . NASAL SINUS SURGERY    . TONSILLECTOMY      There were no vitals filed for this visit.  Subjective Assessment - 09/12/19 1448    Subjective  Pt. reporting she felt better after last ionto patch.    Patient Stated Goals  "get back to running w/o pain"    Pain Score  --   pain rising to sharp 4-5/10   Pain Location  Buttocks    Pain Orientation  Left    Pain Descriptors / Indicators  Aching;Sharp    Pain Type  Acute pain    Pain Radiating Towards  to L lateral hip    Pain Onset  More than a month ago    Pain Frequency  Intermittent    Multiple Pain Sites  No         [facility] PT Assessment - 09/12/19 0001      Observation/Other Assessments   Focus on Therapeutic Outcomes (FOTO)   84% (16% limitation)                   [facility] Adult PT Treatment/Exercise - 09/12/19 0001      Self-Care   Self-Care  Other Self-Care Comments    Other Self-Care Comments   Discussion with pt. regarding need to not over-exert her L hip musculature with repetive bending with planting tomatos, excessive performance of HEP; pt. verbalizing understanding however admitted to "overdoing it".        Knee/Hip Exercises: Stretches   Other Knee/Hip Stretches  L Manual TFL strethc 2 x 30 sec       Knee/Hip Exercises: Aerobic   Nustep  L5 x 6 min (UE/LE)       Knee/Hip Exercises: Standing   Forward Lunges  Right;Left;10 reps;2 seconds    Forward Lunges Limitations  counter support       Iontophoresis   Type of Iontophoresis  Dexamethasone    Location  L TFL/GT    Dose  97m-min, 1.058m   Time  4-6 hr patch (#4 of 6)      Manual Therapy   Manual Therapy  Soft tissue mobilization;Myofascial release    Manual therapy comments  R side lying with L LE supported on bolster    Soft tissue mobilization  STM/DTM to L TFL, L glute med, piriformis, L VL/ITB - most ttp in L piri    Myofascial Release  Manual TPR to L glute med, piri               PT Short Term Goals - 08/19/19 1754      PT SHORT TERM GOAL #1   Title  Patient will be independent with initial HEP    Status  Achieved   08/15/19       PT Long Term Goals - 09/09/19 1756      PT LONG TERM GOAL #1   Title  Patient will be independent with ongoing/advanced HEP    Status  Partially Met   09/09/19 - met for current HEP     PT LONG TERM GOAL #2   Title  Patient to demonstrate improved tissue quality and pliability with reduced pain    Status  Partially Met   09/09/19     PT LONG TERM GOAL #3   Title  Patient will demonstrate improved B proximal LE strength to >/= 4 to 4+/5 for improved stability and ease of mobility    Status  On-going      PT LONG TERM GOAL #4   Title  Patient will improve walking tolerance to >/= 3-4 miles w/o pain interference to allow resumption of normal daily activities    Status  On-going   08/26/19 - able to walk 2.5 miles w/o pain interference     PT LONG TERM GOAL #5   Title  Patient will negotiate stairs reciprocally with normal step pattern w/o limitation due to L hip pain or weakness    Status  Partially Met   09/09/19     PT LONG TERM GOAL #6   Title  Patient will be able to resume light jogging/running up to 2 miles w/o L hip pain    Status  On-going            Plan - 09/12/19 1457    Clinical Impression Statement  Rhonda [name] verbalizing that she may have overdone home exercises since last visit.  Must plant more tomato plants over the weekend however does anticipate being able to "take it easy".  Pt. continues to admit to walking 2-5 miles/day which may be contributing to her slow progress with pain resolution at lateral hip.  Focused  strengthening activities on lateral hip/LE musculature and focused MT to proximal glutes/TFL in areas of tenderness.  Pt. wishing to end session with iontophoresis patch as she notes some pain relief from previous patch thus applied patch #4/6 to L TFL/GT.  Will monitor response in coming session.    Comorbidities  h/o L knee scope x 2 for meniscal debridement and cyst removal, Tibial tendinitis, Patellar tendinitis, R Achilles tendinitis, Injury of plantaris muscle or tendon, Tibial stress fracture, Pelvic stress fracture, Metatarsal stress fracture, Hammer toe, L trochanteric bursitis, L RCR, Asthma (mild intermittent asthma without complication), GERD, BPPV, Sensorineural hearing loss, Breast cancer, Basal cell carcinoma    Rehab Potential  Good    PT Frequency  2x / week    PT Treatment/Interventions  ADLs/Self Care Home Management;Cryotherapy;Electrical Stimulation;Moist Heat;Ultrasound;Gait training;Stair training;Functional mobility training;Therapeutic activities;Therapeutic exercise;Balance training;Neuromuscular re-education;Patient/family education;Manual techniques;Passive range of motion;Dry needling;Taping;Joint Manipulations    PT Next Visit Plan  Progress proximal LE strengthening including  glute minumus and medius, L hip flexibilty/stretching, manual STM/MFR, modalities PRN    PT Home Exercise Plan  08/12/19 - standing & supine ITB stretches,  kneel hip flexor stretch, standing and sidelying glute med strengthening, yellow TB psoas march; 08/23/19 - seated yellow TB hip ABD; 08/26/19 - standing yellow TB 3-way hip SLR, squat + yellow TB hip abduction isometric; 09/04/19 - glute ball massage with ball on wall; 09/09/19 - quadruped hip extension, ext/abd & fire hydrants    Consulted and Agree with Plan of Care  Patient       Patient will benefit from skilled therapeutic intervention in order to improve the following deficits and impairments:  Abnormal gait, Decreased activity tolerance, Decreased  knowledge of precautions, Decreased mobility, Decreased strength, Difficulty walking, Increased edema, Increased fascial restricitons, Increased muscle spasms, Impaired perceived functional ability, Pain  Visit Diagnosis: Pain in left hip  Difficulty in walking, not elsewhere classified  Muscle weakness (generalized)  Other muscle spasm  Other symptoms and signs involving the musculoskeletal system     Problem List Patient Active Problem List   Diagnosis Date Noted  . GERD (gastroesophageal reflux disease) 05/07/2019  . Wrist pain, acute, left 04/23/2019  . Knee pain, left 06/15/2017  . Rotator cuff tendinitis, left 09/08/2016  . Right foot pain 04/06/2016  . Injury of plantaris muscle or tendon 10/30/2014  . Achilles tendinitis of right lower extremity 06/19/2014  . Thrombocytopenia ([place]) 01/30/2014  . Benign paroxysmal positional vertigo 11/13/2013  . Insomnia 11/13/2013  . Sensorineural hearing loss 11/13/2013  . Tinnitus 11/13/2013  . Hyperlipidemia LDL goal <130 10/24/2013  . Mild intermittent asthma without complication 48/18/5631  . Right knee pain 04/23/2013  . Patellar tendinitis 03/07/2013  . Muscle tear 11/15/2012  . Metatarsal stress fracture 08/29/2012  . Numbness and tingling of foot 03/29/2012  . Ankle pain 08/17/2011  . Chest pain in adult 08/17/2011  . TROCHANTERIC BURSITIS, LEFT 04/27/2010  . LEG PAIN, RIGHT 09/22/2009  . TIBIALIS TENDINITIS 08/12/2009  . STRESS FRACTURE, TIBIA 06/15/2009  . UNEQUAL LEG LENGTH 09/18/2008  . CARCINOMA, BASAL CELL 02/07/2008  . Malignant neoplasm of female breast ([place]) 02/07/2008  . STRESS FRACTURE OF PELVIS 08/29/2007  . HAMMER TOE, OTHER, ACQUIRED 10/13/2006  . ALLERGIC RHINITIS 10/12/2006  . Asthma 10/12/2006  . Allergic rhinitis 10/12/2006    Bess Harvest, PTA 09/12/19 6:00 PM   [facility] [address]  [place], [place], [address] Phone:  [phone]   Fax:  [phone]  Name: Rhonda [name] MRN: [id number] Date of Birth: 1950/09/26

## 2019-09-16 ENCOUNTER — Ambulatory Visit: Payer: 59 | Admitting: Physical Therapy

## 2019-09-16 ENCOUNTER — Encounter: Payer: Self-pay | Admitting: Physical Therapy

## 2019-09-16 ENCOUNTER — Other Ambulatory Visit: Payer: Self-pay

## 2019-09-16 DIAGNOSIS — R29898 Other symptoms and signs involving the musculoskeletal system: Secondary | ICD-10-CM

## 2019-09-16 DIAGNOSIS — M6281 Muscle weakness (generalized): Secondary | ICD-10-CM | POA: Diagnosis not present

## 2019-09-16 DIAGNOSIS — M25552 Pain in left hip: Secondary | ICD-10-CM

## 2019-09-16 DIAGNOSIS — R262 Difficulty in walking, not elsewhere classified: Secondary | ICD-10-CM | POA: Diagnosis not present

## 2019-09-16 DIAGNOSIS — M62838 Other muscle spasm: Secondary | ICD-10-CM

## 2019-09-16 NOTE — Therapy (Signed)
[facility] [place] [address]  [place], [place], [address] Phone: [phone]   Fax:  [phone]  Physical Therapy Treatment  Patient Details  Name: Rhonda [name] MRN: [id number] Date of Birth: 1951-02-05 Referring Provider (PT): Karlton Lemon, MD   Encounter Date: 09/16/2019  PT End of Session - 09/16/19 1707    Visit Number  11    Number of Visits  12    Date for PT Re-Evaluation  09/23/19    Authorization Type  [facility]    Progress Note Due on Visit  12   previous PN sent on visit #5 for MD f/u appt   PT Start Time  1707    PT Stop Time  1749    PT Time Calculation (min)  42 min    Activity Tolerance  Patient tolerated treatment well    Behavior During Therapy  [facility] for tasks assessed/performed       Past Medical History:  Diagnosis Date  . Achilles tendinitis of right lower extremity 06/19/2014   US showed minor changes in December 15  Most sxs seem to be enthesopathy  Note had reaction to NTG in past  Overview:  Overview:  US showed minor changes in December 15  Most sxs seem to be enthesopathy  Note had reaction to NTG in past  Last Assessment & Plan:  Recent irritation in the race appears to be a partial plantaris tear  This continues to give her some Achilles symptoms  I think a period  . ALLERGIC RHINITIS 10/12/2006   Qualifier: Diagnosis of  By: McDiarmid MD, Sherren Mocha    . Ankle pain 08/17/2011  . Asthma 10/12/2006   Qualifier: Diagnosis of  By: McDiarmid MD, Sherren Mocha    Overview:  Overview:  Qualifier: Diagnosis of  By: McDiarmid MD, Sherren Mocha   . CARCINOMA, BASAL CELL 02/07/2008   Qualifier: History of  By: Drue Flirt  MD, Merrily Brittle    Overview:  Overview:  Qualifier: History of  By: Drue Flirt  MD, Merrily Brittle   Overview:  Qualifier: History of  By: Drue Flirt  MD, Merrily Brittle   . Chest pain in adult 08/17/2011   This has worrisome features particularly with her level of exertion   . HAMMER TOE, OTHER, ACQUIRED 10/13/2006   Qualifier:  Diagnosis of  By: McDiarmid MD, Sherren Mocha    Overview:  Overview:  Qualifier: Diagnosis of  By: McDiarmid MD, Sherren Mocha   . High cholesterol   . Hyperlipidemia LDL goal <130 10/24/2013  . Injury of plantaris muscle or tendon 10/30/2014  . Insomnia 11/13/2013  . Knee pain, left 06/15/2017  . LEG PAIN, RIGHT 09/22/2009   Qualifier: Diagnosis of  By: Oneida Alar MD, KARL    . Malignant neoplasm of female breast ([place]) 02/07/2008   Qualifier: History of  By: Drue Flirt  MD, Merrily Brittle    Overview:  Overview:  Qualifier: History of  By: Drue Flirt  MD, Merrily Brittle  Overview:  Overview:  Qualifier: History of  By: Drue Flirt  MD, Merrily Brittle   . Metatarsal stress fracture 08/29/2012   This is at the distal shaft of the fifth metatarsal left foot   . Mild intermittent asthma without complication 1/32/4401  . Muscle tear 11/15/2012   Left rectus femoris; also like tendon tear   . Numbness and tingling of foot 03/29/2012  . Patellar tendinitis 03/07/2013   I suspect this occurred because she did less crosstraining before this marathon and was probably hamstring dominant   . Right foot pain  04/06/2016  . Right knee pain 04/23/2013   04/23/13  ultrasound today RT knee  revealed no significant effusion The quadriceps tendon is intact as is the patellar tendon The medial meniscus was normal The lateral meniscus had an area of calcification and a very small area of splitting along the midline to the posterior third of the joint line   . Rotator cuff tendinitis, left 09/08/2016   Overview:  Last Assessment & Plan:  Significant improvement  See OV summary  . Sensorineural hearing loss 11/13/2013  . Stress fracture of pelvis 08/29/2007   Qualifier: History of  By: Drue Flirt  MD, Merrily Brittle    . STRESS FRACTURE, TIBIA 06/15/2009   Qualifier: Diagnosis of  By: Drue Flirt  MD, Merrily Brittle    . Thrombocytopenia ([place]) 01/30/2014  . Tibialis tendinitis 08/12/2009   Qualifier: Diagnosis of  By: Oneida Alar MD, KARL    . Tinnitus 11/13/2013  . TROCHANTERIC BURSITIS, LEFT  04/27/2010   Qualifier: Diagnosis of  By: Ernestina Patches MD, Remo Lipps    . UNEQUAL LEG LENGTH 09/18/2008   Qualifier: Diagnosis of  By: Oneida Alar MD, KARL      Past Surgical History:  Procedure Laterality Date  . ABDOMINAL SURGERY    . ABDOMINAL SURGERY     for removal of abdominal mass-cancerous  . KNEE SURGERY    . MANDIBLE FRACTURE SURGERY    . NASAL SINUS SURGERY    . TONSILLECTOMY      There were no vitals filed for this visit.  Subjective Assessment - 09/16/19 1710    Subjective  Pt reports she got very discouraged starting Friday. Wasn't feeling great so did not do exercises. Worked in garden, lifting multiple 10, 20 & 40 lb bags of soil and birdseed. Saturday went walking with girlfriends for 4 miles but in "excruciating pain" the whole time. Did some exercise that night. Sore all day on Sunday but did exercises BID and walked 2 miles with her sister. Feels very discouraged because her "cardio is gone" - SOB with all walks over the weekend and difficulty keeping up with others. Today tried running 60 steps x 4 interval with 2-minute walking rest breaks in between during lunchtime walk - able to manage with minimal pain but required specific alignment/patterning of movement.    Patient Stated Goals  "get back to running w/o pain"    Currently in Pain?  Yes    Pain Score  2    1 or 2/10   Pain Location  Buttocks    Pain Orientation  Left    Pain Descriptors / Indicators  Aching;Sharp    Pain Type  Acute pain    Pain Onset  --    Pain Frequency  Intermittent         [facility] PT Assessment - 09/16/19 1707      Assessment   Next MD Visit  09/23/19                   [facility] Adult PT Treatment/Exercise - 09/16/19 1707      Knee/Hip Exercises: Aerobic   Nustep  L6 x 6 min (UE/LE)       Manual Therapy   Manual Therapy  Soft tissue mobilization;Myofascial release;Taping    Manual therapy comments  R side lying with L LE supported on bolster    Soft tissue mobilization  STM/DTM  to L TFL, L glute med, piriformis, L VL/ITB    Myofascial Release  Manual TPR to L glute med & piriformis,  Pin & stretch to L ITB    [place];Inhibit Muscle      Kinesiotix   Create Space  L ITB - 50% from ITB insertion to proximal to greater trochanter (GT)    Inhibit Muscle   L glutes - 2 30-50% strips from post lip of iliac crest crossing anteriorly over GT & lateral lip of iliac crest crossing distal to GT             PT Education - 09/16/19 1749    Education Details  Kinesiotape wearing instrucions    Person(s) Educated  Patient    Methods  Explanation;Handout    Comprehension  Verbalized understanding       PT Short Term Goals - 08/19/19 1754      PT SHORT TERM GOAL #1   Title  Patient will be independent with initial HEP    Status  Achieved   08/15/19       PT Long Term Goals - 09/09/19 1756      PT LONG TERM GOAL #1   Title  Patient will be independent with ongoing/advanced HEP    Status  Partially Met   09/09/19 - met for current HEP     PT LONG TERM GOAL #2   Title  Patient to demonstrate improved tissue quality and pliability with reduced pain    Status  Partially Met   09/09/19     PT LONG TERM GOAL #3   Title  Patient will demonstrate improved B proximal LE strength to >/= 4 to 4+/5 for improved stability and ease of mobility    Status  On-going      PT LONG TERM GOAL #4   Title  Patient will improve walking tolerance to >/= 3-4 miles w/o pain interference to allow resumption of normal daily activities    Status  On-going   08/26/19 - able to walk 2.5 miles w/o pain interference     PT LONG TERM GOAL #5   Title  Patient will negotiate stairs reciprocally with normal step pattern w/o limitation due to L hip pain or weakness    Status  Partially Met   09/09/19     PT LONG TERM GOAL #6   Title  Patient will be able to resume light jogging/running up to 2 miles w/o L hip pain    Status  On-going            Plan - 09/16/19 1749     Clinical Impression Statement  Rhonda [name] arriving to PT today expressing frustration regarding lack of progress to her expectations, noting all of her concerns regarding her activities especially exercise, walking and running tolerance over the weekend, although also halfheartedly noting progress with PT inability to walk and navigate stairs reciprocally as compared to when she started PT. When PT suggested that she try taking a day or days off from exacerbating activities (walking/running and/or exercises), she responded with "if I stop moving, I'll die". Manual therapy today targeting STM/DTM and MFR to L TFL, ITB, glutes and piriformis with multiple tender areas noted in lateral glutes/piriformis and mid ITB. Trial of kinesiotaping for ITB and glutes initiated today with patient again cautioned not to overdo walking/running if benefit noted. Pt has 1 visit remaining in [place] but has expressed desire to continue with PT, therefore will assess appropriateness for recertification on final visit.    Personal Factors and Comorbidities  Comorbidity 3+;Time since onset of injury/illness/exacerbation;Past/Current Experience;Age;Profession;Fitness    Comorbidities  h/o L knee scope x 2 for meniscal debridement and cyst removal, Tibial tendinitis, Patellar tendinitis, R Achilles tendinitis, Injury of plantaris muscle or tendon, Tibial stress fracture, Pelvic stress fracture, Metatarsal stress fracture, Hammer toe, L trochanteric bursitis, L RCR, Asthma (mild intermittent asthma without complication), GERD, BPPV, Sensorineural hearing loss, Breast cancer, Basal cell carcinoma    Examination-Activity Limitations  Locomotion Level;Sleep;Stairs;Transfers    Examination-Participation Restrictions  Community Activity;Other    Rehab Potential  Good    PT Frequency  2x / week    PT Treatment/Interventions  ADLs/Self Care Home Management;Cryotherapy;Electrical Stimulation;Moist Heat;Ultrasound;Gait training;Stair  training;Functional mobility training;Therapeutic activities;Therapeutic exercise;Balance training;Neuromuscular re-education;Patient/family education;Manual techniques;Passive range of motion;Dry needling;Taping;Joint Manipulations    PT Next Visit Plan  Goal assessment with probable recert; progress proximal LE strengthening including glute minumus and medius, L hip flexibilty/stretching, manual STM/MFR, modalities PRN    PT Home Exercise Plan  08/12/19 - standing & supine ITB stretches,  kneel hip flexor stretch, standing and sidelying glute med strengthening, yellow TB psoas march; 08/23/19 - seated yellow TB hip ABD; 08/26/19 - standing yellow TB 3-way hip SLR, squat + yellow TB hip abduction isometric; 09/04/19 - glute ball massage with ball on wall; 09/09/19 - quadruped hip extension, ext/abd & fire hydrants    Consulted and Agree with Plan of Care  Patient       Patient will benefit from skilled therapeutic intervention in order to improve the following deficits and impairments:  Abnormal gait, Decreased activity tolerance, Decreased knowledge of precautions, Decreased mobility, Decreased strength, Difficulty walking, Increased edema, Increased fascial restricitons, Increased muscle spasms, Impaired perceived functional ability, Pain  Visit Diagnosis: Pain in left hip  Difficulty in walking, not elsewhere classified  Muscle weakness (generalized)  Other muscle spasm  Other symptoms and signs involving the musculoskeletal system     Problem List Patient Active Problem List   Diagnosis Date Noted  . GERD (gastroesophageal reflux disease) 05/07/2019  . Wrist pain, acute, left 04/23/2019  . Knee pain, left 06/15/2017  . Rotator cuff tendinitis, left 09/08/2016  . Right foot pain 04/06/2016  . Injury of plantaris muscle or tendon 10/30/2014  . Achilles tendinitis of right lower extremity 06/19/2014  . Thrombocytopenia ([place]) 01/30/2014  . Benign paroxysmal positional vertigo 11/13/2013   . Insomnia 11/13/2013  . Sensorineural hearing loss 11/13/2013  . Tinnitus 11/13/2013  . Hyperlipidemia LDL goal <130 10/24/2013  . Mild intermittent asthma without complication 05/17/3233  . Right knee pain 04/23/2013  . Patellar tendinitis 03/07/2013  . Muscle tear 11/15/2012  . Metatarsal stress fracture 08/29/2012  . Numbness and tingling of foot 03/29/2012  . Ankle pain 08/17/2011  . Chest pain in adult 08/17/2011  . TROCHANTERIC BURSITIS, LEFT 04/27/2010  . LEG PAIN, RIGHT 09/22/2009  . TIBIALIS TENDINITIS 08/12/2009  . STRESS FRACTURE, TIBIA 06/15/2009  . UNEQUAL LEG LENGTH 09/18/2008  . CARCINOMA, BASAL CELL 02/07/2008  . Malignant neoplasm of female breast ([place]) 02/07/2008  . STRESS FRACTURE OF PELVIS 08/29/2007  . HAMMER TOE, OTHER, ACQUIRED 10/13/2006  . ALLERGIC RHINITIS 10/12/2006  . Asthma 10/12/2006  . Allergic rhinitis 10/12/2006    Percival Spanish, PT, MPT 09/16/2019, 6:52 PM  [facility] [address]  [place], [place], [address] Phone: [phone]   Fax:  [phone]  Name: Rhonda [name] MRN: [id number] Date of Birth: 02-17-1951

## 2019-09-16 NOTE — Patient Instructions (Signed)
   Kinesiology tape  What is kinesiology tape?  There are many brands of kinesiology tape. KTape, Rock Tape, Body Sport, Dynamic tape, to name a few.  It is an elasticized tape designed to support the body's natural healing process. This tape provides stability and support to muscles and joints without restricting motion.  It can also help decrease swelling in the area of application.  How does it work?  The tape microscopically lifts and decompresses the skin to allow for drainage of lymph (swelling) to flow away from area, reducing inflammation. The tape has the ability to help re-educate the neuromuscular system by targeting specific receptors in the skin. The presence of the tape increases the body's awareness of posture and body mechanics.  Do not use with:  . Open wounds . Skin lesions . Adhesive allergies  In some rare cases, mild/moderate skin irritation can occur. This can include redness, itchiness, or hives. If this occurs, immediately remove tape and consult your primary care physician if symptoms are severe or do not resolve within 2 days.  Safe removal of the tape:  To remove tape safely, hold nearby skin with one hand and gentle roll tape down with other hand. You can apply oil or conditioner to tape while in shower prior to removal to loosen adhesive. DO NOT swiftly rip tape off like a band-aid, as this could cause skin tears and additional skin irritation.     For questions, please contact your therapist at:  [facility] [place] [address]  [address] [place], [place], [address] Phone: [phone]   Fax:  [phone]     

## 2019-09-19 ENCOUNTER — Ambulatory Visit: Payer: 59 | Admitting: Physical Therapy

## 2019-09-19 ENCOUNTER — Other Ambulatory Visit: Payer: Self-pay

## 2019-09-19 ENCOUNTER — Encounter: Payer: Self-pay | Admitting: Physical Therapy

## 2019-09-19 DIAGNOSIS — M62838 Other muscle spasm: Secondary | ICD-10-CM | POA: Diagnosis not present

## 2019-09-19 DIAGNOSIS — M25552 Pain in left hip: Secondary | ICD-10-CM

## 2019-09-19 DIAGNOSIS — R29898 Other symptoms and signs involving the musculoskeletal system: Secondary | ICD-10-CM

## 2019-09-19 DIAGNOSIS — M6281 Muscle weakness (generalized): Secondary | ICD-10-CM

## 2019-09-19 DIAGNOSIS — R262 Difficulty in walking, not elsewhere classified: Secondary | ICD-10-CM

## 2019-09-19 NOTE — Therapy (Signed)
[facility] [address]  [place], [place], [address] Phone: [phone]   Fax:  [phone]  Physical Therapy Treatment / Recert  Patient Details  Name: Rhonda [name] MRN: [id number] Date of Birth: January 14, 1951 Referring Provider (PT): Karlton Lemon, MD   Encounter Date: 09/19/2019  PT End of Session - 09/19/19 1612    Visit Number  12    Number of Visits  24    Date for PT Re-Evaluation  10/31/19    Authorization Type  [facility]    Progress Note Due on Visit  22    PT Start Time  1612    PT Stop Time  1709    PT Time Calculation (min)  57 min    Activity Tolerance  Patient tolerated treatment well    Behavior During Therapy  [facility] for tasks assessed/performed       Past Medical History:  Diagnosis Date  . Achilles tendinitis of right lower extremity 06/19/2014   US showed minor changes in December 15  Most sxs seem to be enthesopathy  Note had reaction to NTG in past  Overview:  Overview:  US showed minor changes in December 15  Most sxs seem to be enthesopathy  Note had reaction to NTG in past  Last Assessment & Plan:  Recent irritation in the race appears to be a partial plantaris tear  This continues to give her some Achilles symptoms  I think a period  . ALLERGIC RHINITIS 10/12/2006   Qualifier: Diagnosis of  By: McDiarmid MD, Sherren Mocha    . Ankle pain 08/17/2011  . Asthma 10/12/2006   Qualifier: Diagnosis of  By: McDiarmid MD, Sherren Mocha    Overview:  Overview:  Qualifier: Diagnosis of  By: McDiarmid MD, Sherren Mocha   . CARCINOMA, BASAL CELL 02/07/2008   Qualifier: History of  By: Drue Flirt  MD, Merrily Brittle    Overview:  Overview:  Qualifier: History of  By: Drue Flirt  MD, Merrily Brittle   Overview:  Qualifier: History of  By: Drue Flirt  MD, Merrily Brittle   . Chest pain in adult 08/17/2011   This has worrisome features particularly with her level of exertion   . HAMMER TOE, OTHER, ACQUIRED 10/13/2006   Qualifier: Diagnosis of  By: McDiarmid MD, Sherren Mocha     Overview:  Overview:  Qualifier: Diagnosis of  By: McDiarmid MD, Sherren Mocha   . High cholesterol   . Hyperlipidemia LDL goal <130 10/24/2013  . Injury of plantaris muscle or tendon 10/30/2014  . Insomnia 11/13/2013  . Knee pain, left 06/15/2017  . LEG PAIN, RIGHT 09/22/2009   Qualifier: Diagnosis of  By: Oneida Alar MD, KARL    . Malignant neoplasm of female breast ([place]) 02/07/2008   Qualifier: History of  By: Drue Flirt  MD, Merrily Brittle    Overview:  Overview:  Qualifier: History of  By: Drue Flirt  MD, Merrily Brittle  Overview:  Overview:  Qualifier: History of  By: Drue Flirt  MD, Merrily Brittle   . Metatarsal stress fracture 08/29/2012   This is at the distal shaft of the fifth metatarsal left foot   . Mild intermittent asthma without complication 4/50/3888  . Muscle tear 11/15/2012   Left rectus femoris; also like tendon tear   . Numbness and tingling of foot 03/29/2012  . Patellar tendinitis 03/07/2013   I suspect this occurred because she did less crosstraining before this marathon and was probably hamstring dominant   . Right foot pain 04/06/2016  . Right knee pain 04/23/2013  04/23/13  ultrasound today RT knee  revealed no significant effusion The quadriceps tendon is intact as is the patellar tendon The medial meniscus was normal The lateral meniscus had an area of calcification and a very small area of splitting along the midline to the posterior third of the joint line   . Rotator cuff tendinitis, left 09/08/2016   Overview:  Last Assessment & Plan:  Significant improvement  See OV summary  . Sensorineural hearing loss 11/13/2013  . Stress fracture of pelvis 08/29/2007   Qualifier: History of  By: Drue Flirt  MD, Merrily Brittle    . STRESS FRACTURE, TIBIA 06/15/2009   Qualifier: Diagnosis of  By: Drue Flirt  MD, Merrily Brittle    . Thrombocytopenia ([place]) 01/30/2014  . Tibialis tendinitis 08/12/2009   Qualifier: Diagnosis of  By: Oneida Alar MD, KARL    . Tinnitus 11/13/2013  . TROCHANTERIC BURSITIS, LEFT 04/27/2010   Qualifier: Diagnosis of  By:  Ernestina Patches MD, Remo Lipps    . UNEQUAL LEG LENGTH 09/18/2008   Qualifier: Diagnosis of  By: Oneida Alar MD, KARL      Past Surgical History:  Procedure Laterality Date  . ABDOMINAL SURGERY    . ABDOMINAL SURGERY     for removal of abdominal mass-cancerous  . KNEE SURGERY    . MANDIBLE FRACTURE SURGERY    . NASAL SINUS SURGERY    . TONSILLECTOMY      There were no vitals filed for this visit.  Subjective Assessment - 09/19/19 1615    Subjective  Pt reports she "did not like the KT tape at all" - states it helped the pain in the targeted area but felt that it transfered the pain more posteriorly in her buttocks. Did not remove that tape until middle of the night this morning. Notes return of difficulty with stairs since taping, but able to walk 2 miles at lunch today w/o any issues    Limitations  Walking    How long can you walk comfortably?  2-4 miles, still unable to run    Patient Stated Goals  "get back to running w/o pain"    Currently in Pain?  Yes    Pain Score  2    1-2/10   Pain Location  Buttocks   & lateral hip   Pain Orientation  Left    Pain Descriptors / Indicators  Sore    Pain Type  Acute pain    Pain Frequency  Intermittent    Aggravating Factors   impact of foot fall from trying to run (pain goes away after attempt)    Pain Relieving Factors  pressure over tender spot while walking will often relieve pain, heating pad    Effect of Pain on Daily Activities  unable to run, intermittent difficulty with stairs         [facility] PT Assessment - 09/19/19 1612      Assessment   Medical Diagnosis  L hip pain - TFL strain    Referring Provider (PT)  Karlton Lemon, MD    Next MD Visit  09/23/19      Prior Function   Level of Independence  Independent    Vocation  Full time employment    Vocation Requirements  working from home - [employer] (full workstation setup)    Leisure  running, walking, hiking, reading, knitting/crochet, cooking, gardening      Observation/Other  Assessments   Focus on Therapeutic Outcomes (FOTO)   Hip - 84% (16% limitation)   as of 09/12/19  Strength   Right Hip Flexion  4+/5    Right Hip Extension  4/5    Right Hip External Rotation   4/5    Right Hip Internal Rotation  4+/5    Right Hip ABduction  4/5    Right Hip ADduction  4+/5    Left Hip Flexion  4+/5    Left Hip Extension  4/5    Left Hip External Rotation  4-/5   mild pain   Left Hip Internal Rotation  4-/5   pain   Left Hip ABduction  4-/5    Left Hip ADduction  4/5    Right Knee Flexion  5/5    Right Knee Extension  5/5    Left Knee Flexion  4+/5    Left Knee Extension  5/5    Right Ankle Dorsiflexion  4+/5    Left Ankle Dorsiflexion  4+/5                    [facility] Adult PT Treatment/Exercise - 09/19/19 1612      Ambulation/Gait   Stairs  Yes    Stairs Assistance  7: Independent;6: Modified independent (Device/Increase time)    Stair Management Technique  Alternating pattern;Forwards;No rails;One rail Left    Number of Stairs  14   x 2.5   Height of Stairs  7    Gait Comments  Mild R Trendelenburg on L lead when ascending with reciprocal pattern with use of rail for last 3-4 steps due to fatigue      Knee/Hip Exercises: Aerobic   Nustep  L6 x 6 min (UE/LE)       Knee/Hip Exercises: Standing   Other Standing Knee Exercises  L SLS with R hip hike from floor to level with 2" step x 10 (1 pole A for balance)      Manual Therapy   Manual Therapy  Soft tissue mobilization;Myofascial release;Taping    Manual therapy comments  R side lying with L LE supported on bolster    Soft tissue mobilization  STM/DTM to L TFL, L glute med, piriformis, L VL/ITB    Myofascial Release  Manual TPR to L glute med & piriformis, Pin & stretch to L ITB    Kinesiotex  Create Space      Kinesiotix   Create Space  50% star over area of tenderness L lateral hip               PT Short Term Goals - 08/19/19 1754      PT SHORT TERM GOAL #1   Title   Patient will be independent with initial HEP    Status  Achieved   08/15/19       PT Long Term Goals - 09/19/19 1629      PT LONG TERM GOAL #1   Title  Patient will be independent with ongoing/advanced HEP    Status  Partially Met    Target Date  10/31/19      PT LONG TERM GOAL #2   Title  Patient to demonstrate improved tissue quality and pliability with reduced pain    Status  Partially Met    Target Date  10/31/19      PT LONG TERM GOAL #3   Title  Patient will demonstrate improved B proximal LE strength to >/= 4 to 4+/5 for improved stability and ease of mobility    Status  Partially Met    Target Date  10/31/19  PT LONG TERM GOAL #4   Title  Patient will improve walking tolerance to >/= 3-4 miles w/o pain interference to allow resumption of normal daily activities    Status  Partially Met   09/19/19 - able to walk 2-3 miles with "annoying soreness" but tolerable   Target Date  10/31/19      PT LONG TERM GOAL #5   Title  Patient will negotiate stairs reciprocally with normal step pattern w/o limitation due to L hip pain or weakness    Status  Partially Met    Target Date  10/31/19      PT LONG TERM GOAL #6   Title  Patient will be able to resume light jogging/running up to 2 miles w/o L hip pain    Status  On-going    Target Date  10/31/19            Plan - 09/19/19 1709    Clinical Impression Statement  Rhonda [name] reports 70% improvement in ability to walk and climb stairs since start of PT.  She has been able to resume walking 2-3 miles 2x/day most days - still having some intermittent "annoying soreness" but states this is tolerable and often able to be resolved by holding pressure over the irritable spot while continuing to walk. She has more consistently been able to ascend stairs reciprocally, although does still require UE assist with pull on railing at times (no issues with reciprocal stair descent). Observation of stair negotiation revealing mild R  Trendelenburg on L lead when ascending with reciprocal pattern. She has attempted running "trials" (running for up to 60 steps at a time up to 4 intervals with 2 minute walking rest breaks in between) during her 2-3 mile walks, but continues to be limited by inability to tolerate the impact of her foot fall on L while running despite attempts to alter her stride. L hip strength improving but still notable weakness in hip IR, ER and abduction with pain on resisted rotation IR>ER. Pain location continues to vary throughout lateral and posterior L hip with some relief noted locally from interventions such as ionto patches and kinesiotaping but then developing pain in a new location. STG met with LTGs mostly partially met other than running goal still ongoing.  Rhonda [name] would like to continue with PT in hopes of being able to resume training for her half-marathon in October by the end of May. Given benefit noted from PT and progress toward goals, will recommend recert for additional 2x/wk x 6 weeks.    Personal Factors and Comorbidities  Comorbidity 3+;Time since onset of injury/illness/exacerbation;Past/Current Experience;Age;Profession;Fitness    Comorbidities  h/o L knee scope x 2 for meniscal debridement and cyst removal, Tibial tendinitis, Patellar tendinitis, R Achilles tendinitis, Injury of plantaris muscle or tendon, Tibial stress fracture, Pelvic stress fracture, Metatarsal stress fracture, Hammer toe, L trochanteric bursitis, L RCR, Asthma (mild intermittent asthma without complication), GERD, BPPV, Sensorineural hearing loss, Breast cancer, Basal cell carcinoma    Examination-Activity Limitations  Locomotion Level;Sleep;Stairs;Transfers    Examination-Participation Restrictions  Community Activity;Other    Rehab Potential  Good    PT Frequency  2x / week    PT Duration  6 weeks    PT Treatment/Interventions  ADLs/Self Care Home Management;Cryotherapy;Electrical Stimulation;Moist Heat;Ultrasound;Gait  training;Stair training;Functional mobility training;Therapeutic activities;Therapeutic exercise;Balance training;Neuromuscular re-education;Patient/family education;Manual techniques;Passive range of motion;Dry needling;Taping;Joint Manipulations;Iontophoresis 10m/ml Dexamethasone    PT Next Visit Plan  progress proximal LE strengthening including glute minumus and medius, L hip flexibilty/stretching,  manual STM/MFR, modalities PRN    PT Home Exercise Plan  08/12/19 - standing & supine ITB stretches,  kneel hip flexor stretch, standing and sidelying glute med strengthening, yellow TB psoas march; 08/23/19 - seated yellow TB hip ABD; 08/26/19 - standing yellow TB 3-way hip SLR, squat + yellow TB hip abduction isometric; 09/04/19 - glute ball massage with ball on wall; 09/09/19 - quadruped hip extension, ext/abd & fire hydrants    Consulted and Agree with Plan of Care  Patient       Patient will benefit from skilled therapeutic intervention in order to improve the following deficits and impairments:  Abnormal gait, Decreased activity tolerance, Decreased knowledge of precautions, Decreased mobility, Decreased strength, Difficulty walking, Increased edema, Increased fascial restricitons, Increased muscle spasms, Impaired perceived functional ability, Pain  Visit Diagnosis: Pain in left hip  Difficulty in walking, not elsewhere classified  Muscle weakness (generalized)  Other muscle spasm  Other symptoms and signs involving the musculoskeletal system     Problem List Patient Active Problem List   Diagnosis Date Noted  . GERD (gastroesophageal reflux disease) 05/07/2019  . Wrist pain, acute, left 04/23/2019  . Knee pain, left 06/15/2017  . Rotator cuff tendinitis, left 09/08/2016  . Right foot pain 04/06/2016  . Injury of plantaris muscle or tendon 10/30/2014  . Achilles tendinitis of right lower extremity 06/19/2014  . Thrombocytopenia ([place]) 01/30/2014  . Benign paroxysmal positional vertigo  11/13/2013  . Insomnia 11/13/2013  . Sensorineural hearing loss 11/13/2013  . Tinnitus 11/13/2013  . Hyperlipidemia LDL goal <130 10/24/2013  . Mild intermittent asthma without complication 79/07/8331  . Right knee pain 04/23/2013  . Patellar tendinitis 03/07/2013  . Muscle tear 11/15/2012  . Metatarsal stress fracture 08/29/2012  . Numbness and tingling of foot 03/29/2012  . Ankle pain 08/17/2011  . Chest pain in adult 08/17/2011  . TROCHANTERIC BURSITIS, LEFT 04/27/2010  . LEG PAIN, RIGHT 09/22/2009  . TIBIALIS TENDINITIS 08/12/2009  . STRESS FRACTURE, TIBIA 06/15/2009  . UNEQUAL LEG LENGTH 09/18/2008  . CARCINOMA, BASAL CELL 02/07/2008  . Malignant neoplasm of female breast ([place]) 02/07/2008  . STRESS FRACTURE OF PELVIS 08/29/2007  . HAMMER TOE, OTHER, ACQUIRED 10/13/2006  . ALLERGIC RHINITIS 10/12/2006  . Asthma 10/12/2006  . Allergic rhinitis 10/12/2006    Percival Spanish, PT, MPT 09/19/2019, 7:10 PM  [facility] [address]  [place], [place], [address] Phone: [phone]   Fax:  [phone]  Name: Rhonda [name] MRN: [id number] Date of Birth: 18-Feb-1951

## 2019-09-23 ENCOUNTER — Ambulatory Visit: Payer: 59 | Admitting: Family Medicine

## 2019-09-23 ENCOUNTER — Other Ambulatory Visit: Payer: Self-pay

## 2019-09-23 ENCOUNTER — Encounter: Payer: Self-pay | Admitting: Family Medicine

## 2019-09-23 VITALS — BP 116/68 | Ht 60.0 in | Wt 140.6 lb

## 2019-09-23 DIAGNOSIS — M25552 Pain in left hip: Secondary | ICD-10-CM

## 2019-09-23 NOTE — Progress Notes (Signed)
PCP: Loraine Leriche., MD  Subjective:   HPI: Patient is a 69 y.o. female here for left hip pain.  3/17: She was last seen on 07/24/2019 for her hip pain thought to be 2/2 proximal IT band syndrome or spasms of her TFL. Advised to do icing, home exercises and stretching, declined steroid injection at that time. Today she reports that her left hip pain has improved still having a lot of difficulty with walking upstairs or running.  She still having left hip pain with no radiation.  She tried running about 2 times since her last appointment, it was very painful and she found herself favoring her right leg when she ran, she had to do it in intervals.  She has been doing the icing but reports that this has not helped.  She reports that massages and foam rolling really helps.  She has been doing most of the exercises except one which caused her to have a lot of popping and cracking sounds in her hip. Denies any back pain, radiation, or numbness/tingling. She reports that she has not been able to train for the marathon and has had to cancel her marathon in May.  She wants to go back to her normal self.  4/21: Patient reports she's doing better than last visit. Has completed 5 visits of PT and they're slowly adding strengthening back to her rehab. Still trouble walking up steps. Can wake her up with sharp lateral left hip pain. No radiation. No numbness.  5/17: Patient continues to improve albeit slowly. She has not been able to get back to running. Doing well overall in physical therapy - better able to tolerate stairs and walking. Feels like she has to press on area of pain in right hip to help alleviate this. Doing home exercises twice a day diligently. No back pain coinciding with this. No radiation into lower extremities, numbness. Nitro previously caused headache, rash  Past Medical History:  Diagnosis Date  . Achilles tendinitis of right lower extremity 06/19/2014   US showed minor  changes in December 15  Most sxs seem to be enthesopathy  Note had reaction to NTG in past  Overview:  Overview:  US showed minor changes in December 15  Most sxs seem to be enthesopathy  Note had reaction to NTG in past  Last Assessment & Plan:  Recent irritation in the race appears to be a partial plantaris tear  This continues to give her some Achilles symptoms  I think a period  . ALLERGIC RHINITIS 10/12/2006   Qualifier: Diagnosis of  By: McDiarmid MD, Sherren Mocha    . Ankle pain 08/17/2011  . Asthma 10/12/2006   Qualifier: Diagnosis of  By: McDiarmid MD, Sherren Mocha    Overview:  Overview:  Qualifier: Diagnosis of  By: McDiarmid MD, Sherren Mocha   . CARCINOMA, BASAL CELL 02/07/2008   Qualifier: History of  By: Drue Flirt  MD, Merrily Brittle    Overview:  Overview:  Qualifier: History of  By: Drue Flirt  MD, Merrily Brittle   Overview:  Qualifier: History of  By: Drue Flirt  MD, Merrily Brittle   . Chest pain in adult 08/17/2011   This has worrisome features particularly with her level of exertion   . HAMMER TOE, OTHER, ACQUIRED 10/13/2006   Qualifier: Diagnosis of  By: McDiarmid MD, Sherren Mocha    Overview:  Overview:  Qualifier: Diagnosis of  By: McDiarmid MD, Sherren Mocha   . High cholesterol   . Hyperlipidemia LDL goal <130 10/24/2013  . Injury of plantaris muscle  or tendon 10/30/2014  . Insomnia 11/13/2013  . Knee pain, left 06/15/2017  . LEG PAIN, RIGHT 09/22/2009   Qualifier: Diagnosis of  By: Oneida Alar MD, KARL    . Malignant neoplasm of female breast ([place]) 02/07/2008   Qualifier: History of  By: Drue Flirt  MD, Merrily Brittle    Overview:  Overview:  Qualifier: History of  By: Drue Flirt  MD, Merrily Brittle  Overview:  Overview:  Qualifier: History of  By: Drue Flirt  MD, Merrily Brittle   . Metatarsal stress fracture 08/29/2012   This is at the distal shaft of the fifth metatarsal left foot   . Mild intermittent asthma without complication [id number]  . Muscle tear 11/15/2012   Left rectus femoris; also like tendon tear   . Numbness and tingling of foot 03/29/2012  . Patellar tendinitis  03/07/2013   I suspect this occurred because she did less crosstraining before this marathon and was probably hamstring dominant   . Right foot pain 04/06/2016  . Right knee pain 04/23/2013   04/23/13  ultrasound today RT knee  revealed no significant effusion The quadriceps tendon is intact as is the patellar tendon The medial meniscus was normal The lateral meniscus had an area of calcification and a very small area of splitting along the midline to the posterior third of the joint line   . Rotator cuff tendinitis, left 09/08/2016   Overview:  Last Assessment & Plan:  Significant improvement  See OV summary  . Sensorineural hearing loss 11/13/2013  . Stress fracture of pelvis 08/29/2007   Qualifier: History of  By: Drue Flirt  MD, Merrily Brittle    . STRESS FRACTURE, TIBIA 06/15/2009   Qualifier: Diagnosis of  By: Drue Flirt  MD, Merrily Brittle    . Thrombocytopenia ([place]) 01/30/2014  . Tibialis tendinitis 08/12/2009   Qualifier: Diagnosis of  By: Oneida Alar MD, KARL    . Tinnitus 11/13/2013  . TROCHANTERIC BURSITIS, LEFT 04/27/2010   Qualifier: Diagnosis of  By: Ernestina Patches MD, Remo Lipps    . UNEQUAL LEG LENGTH 09/18/2008   Qualifier: Diagnosis of  By: Oneida Alar MD, KARL      Current Outpatient Medications on File Prior to Visit  Medication Sig Dispense Refill  . albuterol (PROVENTIL HFA) 108 (90 BASE) MCG/ACT inhaler Inhale 2 puffs into the lungs every 6 (six) hours as needed for wheezing.    Marland Kitchen omeprazole (PRILOSEC) 20 MG capsule Take 1 capsule by mouth as needed.    . rosuvastatin (CRESTOR) 10 MG tablet Take by mouth.     No current facility-administered medications on file prior to visit.    Past Surgical History:  Procedure Laterality Date  . ABDOMINAL SURGERY    . ABDOMINAL SURGERY     for removal of abdominal mass-cancerous  . KNEE SURGERY    . MANDIBLE FRACTURE SURGERY    . NASAL SINUS SURGERY    . TONSILLECTOMY      Allergies  Allergen Reactions  . Acetaminophen     REACTION: mild throat swelling if takes  more than a few consecutive doses  . Aspirin     REACTION: anaphylaxis  . Nsaids     REACTION: face \\T \throat swelling, sneezing and difficulty breathing  . Pravastatin Other (See Comments)    "joint pains"    Social History   Socioeconomic History  . Marital status: Married    Spouse name: Not on file  . Number of children: Not on file  . Years of education: Not on file  . Highest education level: Not on file  Occupational History  . Not on file  Tobacco Use  . Smoking status: Never Smoker  . Smokeless tobacco: Never Used  Substance and Sexual Activity  . Alcohol use: Yes    Comment: occ glass of wine  . Drug use: No  . Sexual activity: Not on file  Other Topics Concern  . Not on file  Social History Narrative  . Not on file   Social Determinants of Health   Financial Resource Strain:   . Difficulty of Paying Living Expenses:   Food Insecurity:   . Worried About Charity fundraiser in the Last Year:   . Arboriculturist in the Last Year:   Transportation Needs:   . Film/video editor (Medical):   Marland Kitchen Lack of Transportation (Non-Medical):   Physical Activity:   . Days of Exercise per Week:   . Minutes of Exercise per Session:   Stress:   . Feeling of Stress :   Social Connections:   . Frequency of Communication with Friends and Family:   . Frequency of Social Gatherings with Friends and Family:   . Attends Religious Services:   . Active Member of Clubs or Organizations:   . Attends Archivist Meetings:   Marland Kitchen Marital Status:   Intimate Partner Violence:   . Fear of Current or Ex-Partner:   . Emotionally Abused:   Marland Kitchen Physically Abused:   . Sexually Abused:     Family History  Problem Relation Age of Onset  . COPD Mother   . Pulmonary fibrosis Father   . Parkinson's disease Father   . Diabetes Father   . Thyroid disease Sister   . Diabetes Sister   . Heart attack Neg Hx     BP 116/68   Ht 5' (1.524 m)   Wt 140 lb 9.6 oz (63.8 kg)   BMI  27.46 kg/m   Review of Systems: See HPI above.     Objective:  Physical Exam:  Gen: NAD, comfortable in exam room  Back: No gross deformity, scoliosis. No paraspinal TTP .  No midline or bony TTP. FROM without pain. Strength LEs 5/5 all muscle groups except 4/5 left hip abduction and 5-/5 right hip abduction 2+ MSRs in patellar and achilles tendons, equal bilaterally. Negative SLRs. Sensation intact to light touch bilaterally.  Left hip: No deformity. FROM with 5/5 strength except hip abduction noted above. TTP over external rotators primarily today.  No other tenderness. NVI distally. Negative logroll. Negative fabers and piriformis stretches. Negative fadir.   Assessment & Plan:  1. Left hip pain - pain primarily in external rotators of hip currently.  She is making slow progress with physical therapy and home exercises.  No evidence of radicular source of her pain to account for this.  Exam otherwise reassuring.  No evidence intraarticular hip issue either.  Discussed options - she will continue with PT, home exercises.  Icing, tylenol, topical medications if needed.  She is going to try to run on flat surface in 2 weeks.  She does not want to try injection.  If still struggling would next do ultrasound of external rotators though typically would not change management even with bursitis or partial tear.  Did not tolerate nitro for different issue in past.  F/u in 6 weeks.

## 2019-09-23 NOTE — Patient Instructions (Signed)
Continue with the physical therapy and home exercises. Tylenol, topical medications if needed. Ice over area of pain 3-4 times a day for 15 minutes at a time Consider ultrasound, prednisone dose pack, injection, MRI as options if not improving. Try to run (but on flat surface) in a couple weeks, don't run on back-to-back days. Let me know how you did with this (mychart message). Try to avoid hills, stairs when possible. Follow up with me in 6 weeks. Let me know if you need anything in the meantime.

## 2019-09-24 ENCOUNTER — Encounter: Payer: Self-pay | Admitting: Physical Therapy

## 2019-09-24 ENCOUNTER — Ambulatory Visit: Payer: 59 | Admitting: Physical Therapy

## 2019-09-24 DIAGNOSIS — R29898 Other symptoms and signs involving the musculoskeletal system: Secondary | ICD-10-CM | POA: Diagnosis not present

## 2019-09-24 DIAGNOSIS — M6281 Muscle weakness (generalized): Secondary | ICD-10-CM

## 2019-09-24 DIAGNOSIS — M62838 Other muscle spasm: Secondary | ICD-10-CM | POA: Diagnosis not present

## 2019-09-24 DIAGNOSIS — R262 Difficulty in walking, not elsewhere classified: Secondary | ICD-10-CM | POA: Diagnosis not present

## 2019-09-24 DIAGNOSIS — M25552 Pain in left hip: Secondary | ICD-10-CM | POA: Diagnosis not present

## 2019-09-24 NOTE — Patient Instructions (Signed)
    Home exercise program created by Demetric Dunnaway, PT.  For questions, please contact Eleanna Theilen via phone at [phone] or email at [email or web address]  [facility] [place] [address]  [address] [place], [place], [address] Phone: [phone]   Fax:  [phone]    

## 2019-09-24 NOTE — Therapy (Signed)
[facility] [place] [address]  [place], [place], [address] Phone: [phone]   Fax:  [phone]  Physical Therapy Treatment  Patient Details  Name: Rhonda [name] MRN: [id number] Date of Birth: 22-Dec-1950 Referring Provider (PT): Karlton Lemon, MD   Encounter Date: 09/24/2019  PT End of Session - 09/24/19 1534    Visit Number  13    Number of Visits  24    Date for PT Re-Evaluation  10/31/19    Authorization Type  [facility]    Progress Note Due on Visit  22    PT Start Time  1534    PT Stop Time  1622    PT Time Calculation (min)  48 min    Activity Tolerance  Patient tolerated treatment well    Behavior During Therapy  [facility] for tasks assessed/performed       Past Medical History:  Diagnosis Date  . Achilles tendinitis of right lower extremity 06/19/2014   US showed minor changes in December 15  Most sxs seem to be enthesopathy  Note had reaction to NTG in past  Overview:  Overview:  US showed minor changes in December 15  Most sxs seem to be enthesopathy  Note had reaction to NTG in past  Last Assessment & Plan:  Recent irritation in the race appears to be a partial plantaris tear  This continues to give her some Achilles symptoms  I think a period  . ALLERGIC RHINITIS 10/12/2006   Qualifier: Diagnosis of  By: McDiarmid MD, Sherren Mocha    . Ankle pain 08/17/2011  . Asthma 10/12/2006   Qualifier: Diagnosis of  By: McDiarmid MD, Sherren Mocha    Overview:  Overview:  Qualifier: Diagnosis of  By: McDiarmid MD, Sherren Mocha   . CARCINOMA, BASAL CELL 02/07/2008   Qualifier: History of  By: Drue Flirt  MD, Merrily Brittle    Overview:  Overview:  Qualifier: History of  By: Drue Flirt  MD, Merrily Brittle   Overview:  Qualifier: History of  By: Drue Flirt  MD, Merrily Brittle   . Chest pain in adult 08/17/2011   This has worrisome features particularly with her level of exertion   . HAMMER TOE, OTHER, ACQUIRED 10/13/2006   Qualifier: Diagnosis of  By: McDiarmid MD, Sherren Mocha    Overview:   Overview:  Qualifier: Diagnosis of  By: McDiarmid MD, Sherren Mocha   . High cholesterol   . Hyperlipidemia LDL goal <130 10/24/2013  . Injury of plantaris muscle or tendon 10/30/2014  . Insomnia 11/13/2013  . Knee pain, left 06/15/2017  . LEG PAIN, RIGHT 09/22/2009   Qualifier: Diagnosis of  By: Oneida Alar MD, KARL    . Malignant neoplasm of female breast ([place]) 02/07/2008   Qualifier: History of  By: Drue Flirt  MD, Merrily Brittle    Overview:  Overview:  Qualifier: History of  By: Drue Flirt  MD, Merrily Brittle  Overview:  Overview:  Qualifier: History of  By: Drue Flirt  MD, Merrily Brittle   . Metatarsal stress fracture 08/29/2012   This is at the distal shaft of the fifth metatarsal left foot   . Mild intermittent asthma without complication 3/61/4431  . Muscle tear 11/15/2012   Left rectus femoris; also like tendon tear   . Numbness and tingling of foot 03/29/2012  . Patellar tendinitis 03/07/2013   I suspect this occurred because she did less crosstraining before this marathon and was probably hamstring dominant   . Right foot pain 04/06/2016  . Right knee pain 04/23/2013   04/23/13  ultrasound today RT knee  revealed no significant effusion The quadriceps tendon is intact as is the patellar tendon The medial meniscus was normal The lateral meniscus had an area of calcification and a very small area of splitting along the midline to the posterior third of the joint line   . Rotator cuff tendinitis, left 09/08/2016   Overview:  Last Assessment & Plan:  Significant improvement  See OV summary  . Sensorineural hearing loss 11/13/2013  . Stress fracture of pelvis 08/29/2007   Qualifier: History of  By: Drue Flirt  MD, Merrily Brittle    . STRESS FRACTURE, TIBIA 06/15/2009   Qualifier: Diagnosis of  By: Drue Flirt  MD, Merrily Brittle    . Thrombocytopenia ([place]) 01/30/2014  . Tibialis tendinitis 08/12/2009   Qualifier: Diagnosis of  By: Oneida Alar MD, KARL    . Tinnitus 11/13/2013  . TROCHANTERIC BURSITIS, LEFT 04/27/2010   Qualifier: Diagnosis of  By: Ernestina Patches MD,  Remo Lipps    . UNEQUAL LEG LENGTH 09/18/2008   Qualifier: Diagnosis of  By: Oneida Alar MD, KARL      Past Surgical History:  Procedure Laterality Date  . ABDOMINAL SURGERY    . ABDOMINAL SURGERY     for removal of abdominal mass-cancerous  . KNEE SURGERY    . MANDIBLE FRACTURE SURGERY    . NASAL SINUS SURGERY    . TONSILLECTOMY      There were no vitals filed for this visit.  Subjective Assessment - 09/24/19 1542    Subjective  Pt reports MD wants her to continue with PT and will potentially look into diagnostic US or MRI if pain persisting as of f/u in 6 weeks She is to wait 2 weeks before attempting to run, then ease into running using intervals as need be. States she took 3 days off from her exercises and notes less of the "deep in there" hip pain but now noting more lateral glute and medial quad soreness.    Patient Stated Goals  "get back to running w/o pain"    Currently in Pain?  Yes    Pain Score  0-No pain   no pain, just soreness   Pain Location  Buttocks    Pain Orientation  Left    Pain Descriptors / Indicators  Sore    Pain Type  Acute pain    Pain Frequency  Intermittent                        [facility] Adult PT Treatment/Exercise - 09/24/19 1534      Knee/Hip Exercises: Stretches   Passive Hamstring Stretch  Left;30 seconds;2 reps    Passive Hamstring Stretch Limitations  seated hip hinge    Hip Flexor Stretch  Left;30 seconds;2 reps    Hip Flexor Stretch Limitations  1/2 kneel lunge - cues to maintain neutral pelvis and avoid fwd trunk lean    Piriformis Stretch  Left;30 seconds;2 reps    Piriformis Stretch Limitations  seated figure 4 hip hinge      Knee/Hip Exercises: Aerobic   Nustep  L6 x 6 min (UE/LE)       Knee/Hip Exercises: Standing   Wall Squat  10 reps;5 seconds    Wall Squat Limitations  + alt red TB hip ABD/ER    Other Standing Knee Exercises  B red TB side stepping & fwd/back monster walk 2 x 10 steps - 1st set with band at knees, 2nd  set with band at ankles, 3rd set (side-step  only) with band at midfoot             PT Education - 09/24/19 1620    Education Details  HEP update - red TB lateral and fwd/back monster walk; reviewed recommended frequency of HEP allowing for periods of rest and recovery    Person(s) Educated  Patient    Methods  Explanation;Demonstration;Handout    Comprehension  Verbalized understanding;Returned demonstration       PT Short Term Goals - 08/19/19 1754      PT SHORT TERM GOAL #1   Title  Patient will be independent with initial HEP    Status  Achieved   08/15/19       PT Long Term Goals - 09/19/19 1629      PT LONG TERM GOAL #1   Title  Patient will be independent with ongoing/advanced HEP    Status  Partially Met    Target Date  10/31/19      PT LONG TERM GOAL #2   Title  Patient to demonstrate improved tissue quality and pliability with reduced pain    Status  Partially Met    Target Date  10/31/19      PT LONG TERM GOAL #3   Title  Patient will demonstrate improved B proximal LE strength to >/= 4 to 4+/5 for improved stability and ease of mobility    Status  Partially Met    Target Date  10/31/19      PT LONG TERM GOAL #4   Title  Patient will improve walking tolerance to >/= 3-4 miles w/o pain interference to allow resumption of normal daily activities    Status  Partially Met   09/19/19 - able to walk 2-3 miles with "annoying soreness" but tolerable   Target Date  10/31/19      PT LONG TERM GOAL #5   Title  Patient will negotiate stairs reciprocally with normal step pattern w/o limitation due to L hip pain or weakness    Status  Partially Met    Target Date  10/31/19      PT LONG TERM GOAL #6   Title  Patient will be able to resume light jogging/running up to 2 miles w/o L hip pain    Status  On-going    Target Date  10/31/19            Plan - 09/24/19 1622    Clinical Impression Statement  [name] returning to PT with plan to f/u with MD again in 6  weeks, at which time diagnostic US or MRI may be considered if pain persisting. She is ok to continue walking and HEP but to hold off on running for 2 weeks, then ease into running using intervals as need be. She reports she took 3 days off from HEP and notes a change in her pain with lessening of the "deep in there" hip pain but now noting more lateral glute and medial quad soreness - utilized this change to reinforced need for moderation with HEP allowing for rest and recovery periods to allow muscle rebuilding. Therapeutic exercises today focusing on alternative stretching positions to address ongoing soreness/tightness, instruction in use of foam roller to address ongoing continued increased muscle tension, and strengthening for areas of continued weakness identified with latest MMT.    Personal Factors and Comorbidities  Comorbidity 3+;Time since onset of injury/illness/exacerbation;Past/Current Experience;Age;Profession;Fitness    Comorbidities  h/o L knee scope x 2 for meniscal debridement and cyst removal, Tibial tendinitis,  Patellar tendinitis, R Achilles tendinitis, Injury of plantaris muscle or tendon, Tibial stress fracture, Pelvic stress fracture, Metatarsal stress fracture, Hammer toe, L trochanteric bursitis, L RCR, Asthma (mild intermittent asthma without complication), GERD, BPPV, Sensorineural hearing loss, Breast cancer, Basal cell carcinoma    Examination-Activity Limitations  Locomotion Level;Sleep;Stairs;Transfers    Examination-Participation Restrictions  Community Activity;Other    Rehab Potential  Good    PT Frequency  2x / week    PT Duration  6 weeks    PT Treatment/Interventions  ADLs/Self Care Home Management;Cryotherapy;Electrical Stimulation;Moist Heat;Ultrasound;Gait training;Stair training;Functional mobility training;Therapeutic activities;Therapeutic exercise;Balance training;Neuromuscular re-education;Patient/family education;Manual techniques;Passive range of motion;Dry  needling;Taping;Joint Manipulations;Iontophoresis 49m/ml Dexamethasone    PT Next Visit Plan  progress proximal LE strengthening including glute minumus and medius, L hip flexibilty/stretching, manual STM/MFR, modalities PRN    PT Home Exercise Plan  08/12/19 - standing & supine ITB stretches,  kneel hip flexor stretch, standing and sidelying glute med strengthening, yellow TB psoas march; 08/23/19 - seated yellow TB hip ABD; 08/26/19 - standing yellow TB 3-way hip SLR, squat + yellow TB hip abduction isometric; 09/04/19 - glute ball massage with ball on wall; 09/09/19 - quadruped hip extension, ext/abd & fire hydrants    Consulted and Agree with Plan of Care  Patient       Patient will benefit from skilled therapeutic intervention in order to improve the following deficits and impairments:  Abnormal gait, Decreased activity tolerance, Decreased knowledge of precautions, Decreased mobility, Decreased strength, Difficulty walking, Increased edema, Increased fascial restricitons, Increased muscle spasms, Impaired perceived functional ability, Pain  Visit Diagnosis: Pain in left hip  Difficulty in walking, not elsewhere classified  Muscle weakness (generalized)  Other muscle spasm  Other symptoms and signs involving the musculoskeletal system     Problem List Patient Active Problem List   Diagnosis Date Noted  . GERD (gastroesophageal reflux disease) 05/07/2019  . Wrist pain, acute, left 04/23/2019  . Knee pain, left 06/15/2017  . Rotator cuff tendinitis, left 09/08/2016  . Right foot pain 04/06/2016  . Injury of plantaris muscle or tendon 10/30/2014  . Achilles tendinitis of right lower extremity 06/19/2014  . Thrombocytopenia ([facility] 01/30/2014  . Benign paroxysmal positional vertigo 11/13/2013  . Insomnia 11/13/2013  . Sensorineural hearing loss 11/13/2013  . Tinnitus 11/13/2013  . Hyperlipidemia LDL goal <130 10/24/2013  . Mild intermittent asthma without complication 053/00/5110 .  Right knee pain 04/23/2013  . Patellar tendinitis 03/07/2013  . Muscle tear 11/15/2012  . Metatarsal stress fracture 08/29/2012  . Numbness and tingling of foot 03/29/2012  . Ankle pain 08/17/2011  . Chest pain in adult 08/17/2011  . TROCHANTERIC BURSITIS, LEFT 04/27/2010  . LEG PAIN, RIGHT 09/22/2009  . TIBIALIS TENDINITIS 08/12/2009  . STRESS FRACTURE, TIBIA 06/15/2009  . UNEQUAL LEG LENGTH 09/18/2008  . CARCINOMA, BASAL CELL 02/07/2008  . Malignant [place] of female breast ([place] 02/07/2008  . STRESS FRACTURE OF PELVIS 08/29/2007  . HAMMER TOE, OTHER, ACQUIRED 10/13/2006  . ALLERGIC RHINITIS 10/12/2006  . Asthma 10/12/2006  . Allergic rhinitis 10/12/2006    JPercival Spanish PT, MPT 09/24/2019, 6:07 PM  [facility] [place] [address]Phone: [phone]  Fax:  [phone] Name: KZARETH RIPPETOEMRN: [id number] of Birth: 305-18-1952

## 2019-09-26 ENCOUNTER — Ambulatory Visit: Payer: 59

## 2019-09-26 ENCOUNTER — Other Ambulatory Visit: Payer: Self-pay

## 2019-09-26 DIAGNOSIS — M62838 Other muscle spasm: Secondary | ICD-10-CM | POA: Diagnosis not present

## 2019-09-26 DIAGNOSIS — M6281 Muscle weakness (generalized): Secondary | ICD-10-CM | POA: Diagnosis not present

## 2019-09-26 DIAGNOSIS — R29898 Other symptoms and signs involving the musculoskeletal system: Secondary | ICD-10-CM

## 2019-09-26 DIAGNOSIS — R262 Difficulty in walking, not elsewhere classified: Secondary | ICD-10-CM | POA: Diagnosis not present

## 2019-09-26 DIAGNOSIS — M25552 Pain in left hip: Secondary | ICD-10-CM | POA: Diagnosis not present

## 2019-09-26 NOTE — Therapy (Signed)
[facility] [address]  [place], [place], [address] Phone: [phone]   Fax:  [phone]  Physical Therapy Treatment  Patient Details  Name: Rhonda [name] MRN: [id number] Date of Birth: 1951/02/17 Referring Provider (PT): Karlton Lemon, MD   Encounter Date: 09/26/2019  PT End of Session - 09/26/19 1539    Visit Number  14    Number of Visits  24    Date for PT Re-Evaluation  10/31/19    Authorization Type  [facility]    Progress Note Due on Visit  22    PT Start Time  1532    PT Stop Time  1616    PT Time Calculation (min)  44 min    Activity Tolerance  Patient tolerated treatment well    Behavior During Therapy  [facility] for tasks assessed/performed       Past Medical History:  Diagnosis Date  . Achilles tendinitis of right lower extremity 06/19/2014   US showed minor changes in December 15  Most sxs seem to be enthesopathy  Note had reaction to NTG in past  Overview:  Overview:  US showed minor changes in December 15  Most sxs seem to be enthesopathy  Note had reaction to NTG in past  Last Assessment & Plan:  Recent irritation in the race appears to be a partial plantaris tear  This continues to give her some Achilles symptoms  I think a period  . ALLERGIC RHINITIS 10/12/2006   Qualifier: Diagnosis of  By: McDiarmid MD, Sherren Mocha    . Ankle pain 08/17/2011  . Asthma 10/12/2006   Qualifier: Diagnosis of  By: McDiarmid MD, Sherren Mocha    Overview:  Overview:  Qualifier: Diagnosis of  By: McDiarmid MD, Sherren Mocha   . CARCINOMA, BASAL CELL 02/07/2008   Qualifier: History of  By: Drue Flirt  MD, Merrily Brittle    Overview:  Overview:  Qualifier: History of  By: Drue Flirt  MD, Merrily Brittle   Overview:  Qualifier: History of  By: Drue Flirt  MD, Merrily Brittle   . Chest pain in adult 08/17/2011   This has worrisome features particularly with her level of exertion   . HAMMER TOE, OTHER, ACQUIRED 10/13/2006   Qualifier: Diagnosis of  By: McDiarmid MD, Sherren Mocha    Overview:   Overview:  Qualifier: Diagnosis of  By: McDiarmid MD, Sherren Mocha   . High cholesterol   . Hyperlipidemia LDL goal <130 10/24/2013  . Injury of plantaris muscle or tendon 10/30/2014  . Insomnia 11/13/2013  . Knee pain, left 06/15/2017  . LEG PAIN, RIGHT 09/22/2009   Qualifier: Diagnosis of  By: Oneida Alar MD, KARL    . Malignant neoplasm of female breast ([place]) 02/07/2008   Qualifier: History of  By: Drue Flirt  MD, Merrily Brittle    Overview:  Overview:  Qualifier: History of  By: Drue Flirt  MD, Merrily Brittle  Overview:  Overview:  Qualifier: History of  By: Drue Flirt  MD, Merrily Brittle   . Metatarsal stress fracture 08/29/2012   This is at the distal shaft of the fifth metatarsal left foot   . Mild intermittent asthma without complication 6/75/4492  . Muscle tear 11/15/2012   Left rectus femoris; also like tendon tear   . Numbness and tingling of foot 03/29/2012  . Patellar tendinitis 03/07/2013   I suspect this occurred because she did less crosstraining before this marathon and was probably hamstring dominant   . Right foot pain 04/06/2016  . Right knee pain 04/23/2013   04/23/13  ultrasound today RT knee  revealed no significant effusion The quadriceps tendon is intact as is the patellar tendon The medial meniscus was normal The lateral meniscus had an area of calcification and a very small area of splitting along the midline to the posterior third of the joint line   . Rotator cuff tendinitis, left 09/08/2016   Overview:  Last Assessment & Plan:  Significant improvement  See OV summary  . Sensorineural hearing loss 11/13/2013  . Stress fracture of pelvis 08/29/2007   Qualifier: History of  By: Drue Flirt  MD, Merrily Brittle    . STRESS FRACTURE, TIBIA 06/15/2009   Qualifier: Diagnosis of  By: Drue Flirt  MD, Merrily Brittle    . Thrombocytopenia ([place]) 01/30/2014  . Tibialis tendinitis 08/12/2009   Qualifier: Diagnosis of  By: Oneida Alar MD, KARL    . Tinnitus 11/13/2013  . TROCHANTERIC BURSITIS, LEFT 04/27/2010   Qualifier: Diagnosis of  By: Ernestina Patches MD,  Remo Lipps    . UNEQUAL LEG LENGTH 09/18/2008   Qualifier: Diagnosis of  By: Oneida Alar MD, KARL      Past Surgical History:  Procedure Laterality Date  . ABDOMINAL SURGERY    . ABDOMINAL SURGERY     for removal of abdominal mass-cancerous  . KNEE SURGERY    . MANDIBLE FRACTURE SURGERY    . NASAL SINUS SURGERY    . TONSILLECTOMY      There were no vitals filed for this visit.  Subjective Assessment - 09/26/19 1535    Subjective  Pt. noting she walked 3 miles in morning this morning and 2 miles over lunch.  Notes, "I think I've overdone it".    Patient Stated Goals  "get back to running w/o pain"    Currently in Pain?  No/denies    Pain Score  0-No pain   up to a 2-3/10 pain in L lateral hip into L HS   Pain Location  Buttocks    Pain Orientation  Left;Lateral    Pain Descriptors / Indicators  Sore    Pain Type  Acute pain    Pain Radiating Towards  to L lateral hip into L proximal lateral thigh, L proximal HS    Pain Onset  More than a month ago    Pain Frequency  Intermittent    Multiple Pain Sites  No                        [facility] Adult PT Treatment/Exercise - 09/26/19 0001      Knee/Hip Exercises: Stretches   Passive Hamstring Stretch  Left;30 seconds;1 rep    Passive Hamstring Stretch Limitations  supine with strap     Hip Flexor Stretch  Left;30 seconds;1 rep    Hip Flexor Stretch Limitations  mod thomas position with strap     Piriformis Stretch  Left;30 seconds;2 reps    Piriformis Stretch Limitations  KTOS       Knee/Hip Exercises: Aerobic   Nustep  L6 x 6 min (UE/LE)       Knee/Hip Exercises: Standing   Lateral Step Up  Left;10 reps;Step Height: 6";Hand Hold: 0    Forward Step Up  Left;10 reps;Hand Hold: 0    Step Down  Left;10 reps;Step Height: 4";Hand Hold: 2    Step Down Limitations  L hip hike      Knee/Hip Exercises: Supine   Bridges  Both;15 reps    Bridges Limitations  3" hold       Manual Therapy  Manual Therapy  Soft tissue  mobilization;Myofascial release    Manual therapy comments  R side lying with L LE supported on bolster    Soft tissue mobilization  DTM to L proximal HS, TFL, L glute med                PT Short Term Goals - 08/19/19 1754      PT SHORT TERM GOAL #1   Title  Patient will be independent with initial HEP    Status  Achieved   08/15/19       PT Long Term Goals - 09/19/19 1629      PT LONG TERM GOAL #1   Title  Patient will be independent with ongoing/advanced HEP    Status  Partially Met    Target Date  10/31/19      PT LONG TERM GOAL #2   Title  Patient to demonstrate improved tissue quality and pliability with reduced pain    Status  Partially Met    Target Date  10/31/19      PT LONG TERM GOAL #3   Title  Patient will demonstrate improved B proximal LE strength to >/= 4 to 4+/5 for improved stability and ease of mobility    Status  Partially Met    Target Date  10/31/19      PT LONG TERM GOAL #4   Title  Patient will improve walking tolerance to >/= 3-4 miles w/o pain interference to allow resumption of normal daily activities    Status  Partially Met   09/19/19 - able to walk 2-3 miles with "annoying soreness" but tolerable   Target Date  10/31/19      PT LONG TERM GOAL #5   Title  Patient will negotiate stairs reciprocally with normal step pattern w/o limitation due to L hip pain or weakness    Status  Partially Met    Target Date  10/31/19      PT LONG TERM GOAL #6   Title  Patient will be able to resume light jogging/running up to 2 miles w/o L hip pain    Status  On-going    Target Date  10/31/19            Plan - 09/26/19 1540    Clinical Impression Statement  Rhonda [name] reporting she walked a total of 5 miles today and felt she had to push through some L hip pain in the last two miles.  Did caution patient to avoid pushing through painful activities however pt. did report adherence to avoiding jogging per MD instruction.  Tolerated progression of LE  strengthening activities with focus on glute max, Medius strengthening.  reviewed ball self-glute massage on wall.  Pt. leaving session noting she was pain free and appears to be tolerating advancement of standing LE strengthening well.    Comorbidities  h/o L knee scope x 2 for meniscal debridement and cyst removal, Tibial tendinitis, Patellar tendinitis, R Achilles tendinitis, Injury of plantaris muscle or tendon, Tibial stress fracture, Pelvic stress fracture, Metatarsal stress fracture, Hammer toe, L trochanteric bursitis, L RCR, Asthma (mild intermittent asthma without complication), GERD, BPPV, Sensorineural hearing loss, Breast cancer, Basal cell carcinoma    Rehab Potential  Good    PT Frequency  2x / week    PT Treatment/Interventions  ADLs/Self Care Home Management;Cryotherapy;Electrical Stimulation;Moist Heat;Ultrasound;Gait training;Stair training;Functional mobility training;Therapeutic activities;Therapeutic exercise;Balance training;Neuromuscular re-education;Patient/family education;Manual techniques;Passive range of motion;Dry needling;Taping;Joint Manipulations;Iontophoresis 33m/ml Dexamethasone    PT Next Visit  Plan  progress proximal LE strengthening including glute minumus and medius, L hip flexibilty/stretching, manual STM/MFR, modalities PRN    PT Home Exercise Plan  08/12/19 - standing & supine ITB stretches,  kneel hip flexor stretch, standing and sidelying glute med strengthening, yellow TB psoas march; 08/23/19 - seated yellow TB hip ABD; 08/26/19 - standing yellow TB 3-way hip SLR, squat + yellow TB hip abduction isometric; 09/04/19 - glute ball massage with ball on wall; 09/09/19 - quadruped hip extension, ext/abd & fire hydrants    Consulted and Agree with Plan of Care  Patient       Patient will benefit from skilled therapeutic intervention in order to improve the following deficits and impairments:  Abnormal gait, Decreased activity tolerance, Decreased knowledge of precautions,  Decreased mobility, Decreased strength, Difficulty walking, Increased edema, Increased fascial restricitons, Increased muscle spasms, Impaired perceived functional ability, Pain  Visit Diagnosis: Pain in left hip  Difficulty in walking, not elsewhere classified  Muscle weakness (generalized)  Other muscle spasm  Other symptoms and signs involving the musculoskeletal system     Problem List Patient Active Problem List   Diagnosis Date Noted  . GERD (gastroesophageal reflux disease) 05/07/2019  . Wrist pain, acute, left 04/23/2019  . Knee pain, left 06/15/2017  . Rotator cuff tendinitis, left 09/08/2016  . Right foot pain 04/06/2016  . Injury of plantaris muscle or tendon 10/30/2014  . Achilles tendinitis of right lower extremity 06/19/2014  . Thrombocytopenia ([place]) 01/30/2014  . Benign paroxysmal positional vertigo 11/13/2013  . Insomnia 11/13/2013  . Sensorineural hearing loss 11/13/2013  . Tinnitus 11/13/2013  . Hyperlipidemia LDL goal <130 10/24/2013  . Mild intermittent asthma without complication 75/02/2584  . Right knee pain 04/23/2013  . Patellar tendinitis 03/07/2013  . Muscle tear 11/15/2012  . Metatarsal stress fracture 08/29/2012  . Numbness and tingling of foot 03/29/2012  . Ankle pain 08/17/2011  . Chest pain in adult 08/17/2011  . TROCHANTERIC BURSITIS, LEFT 04/27/2010  . LEG PAIN, RIGHT 09/22/2009  . TIBIALIS TENDINITIS 08/12/2009  . STRESS FRACTURE, TIBIA 06/15/2009  . UNEQUAL LEG LENGTH 09/18/2008  . CARCINOMA, BASAL CELL 02/07/2008  . Malignant neoplasm of female breast ([place]) 02/07/2008  . STRESS FRACTURE OF PELVIS 08/29/2007  . HAMMER TOE, OTHER, ACQUIRED 10/13/2006  . ALLERGIC RHINITIS 10/12/2006  . Asthma 10/12/2006  . Allergic rhinitis 10/12/2006    Bess Harvest, PTA 09/26/19 4:36 PM    [facility] [address]  [place], [place], [address] Phone: [phone]   Fax:   [phone]  Name: Rhonda [name] MRN: [id number] Date of Birth: 1951/01/19

## 2019-09-30 ENCOUNTER — Ambulatory Visit: Payer: 59

## 2019-09-30 ENCOUNTER — Other Ambulatory Visit: Payer: Self-pay

## 2019-09-30 DIAGNOSIS — M62838 Other muscle spasm: Secondary | ICD-10-CM

## 2019-09-30 DIAGNOSIS — R29898 Other symptoms and signs involving the musculoskeletal system: Secondary | ICD-10-CM | POA: Diagnosis not present

## 2019-09-30 DIAGNOSIS — M25552 Pain in left hip: Secondary | ICD-10-CM

## 2019-09-30 DIAGNOSIS — M6281 Muscle weakness (generalized): Secondary | ICD-10-CM | POA: Diagnosis not present

## 2019-09-30 DIAGNOSIS — R262 Difficulty in walking, not elsewhere classified: Secondary | ICD-10-CM | POA: Diagnosis not present

## 2019-09-30 NOTE — Therapy (Signed)
[facility] [address]  [place], [place], [address] Phone: [phone]   Fax:  [phone]  Physical Therapy Treatment  Patient Details  Name: Rhonda [name] MRN: [id number] Date of Birth: 1951/03/01 Referring Maddock Finigan (PT): Karlton Lemon, MD   Encounter Date: 09/30/2019  PT End of Session - 09/30/19 1633    Visit Number  15    Number of Visits  24    Date for PT Re-Evaluation  10/31/19    Authorization Type  [facility]    Progress Note Due on Visit  22    PT Start Time  1618    PT Stop Time  1700    PT Time Calculation (min)  42 min    Activity Tolerance  Patient tolerated treatment well    Behavior During Therapy  [facility] for tasks assessed/performed       Past Medical History:  Diagnosis Date  . Achilles tendinitis of right lower extremity 06/19/2014   US showed minor changes in December 15  Most sxs seem to be enthesopathy  Note had reaction to NTG in past  Overview:  Overview:  US showed minor changes in December 15  Most sxs seem to be enthesopathy  Note had reaction to NTG in past  Last Assessment & Plan:  Recent irritation in the race appears to be a partial plantaris tear  This continues to give her some Achilles symptoms  I think a period  . ALLERGIC RHINITIS 10/12/2006   Qualifier: Diagnosis of  By: McDiarmid MD, Sherren Mocha    . Ankle pain 08/17/2011  . Asthma 10/12/2006   Qualifier: Diagnosis of  By: McDiarmid MD, Sherren Mocha    Overview:  Overview:  Qualifier: Diagnosis of  By: McDiarmid MD, Sherren Mocha   . CARCINOMA, BASAL CELL 02/07/2008   Qualifier: History of  By: Drue Flirt  MD, Merrily Brittle    Overview:  Overview:  Qualifier: History of  By: Drue Flirt  MD, Merrily Brittle   Overview:  Qualifier: History of  By: Drue Flirt  MD, Merrily Brittle   . Chest pain in adult 08/17/2011   This has worrisome features particularly with her level of exertion   . HAMMER TOE, OTHER, ACQUIRED 10/13/2006   Qualifier: Diagnosis of  By: McDiarmid MD, Sherren Mocha    Overview:   Overview:  Qualifier: Diagnosis of  By: McDiarmid MD, Sherren Mocha   . High cholesterol   . Hyperlipidemia LDL goal <130 10/24/2013  . Injury of plantaris muscle or tendon 10/30/2014  . Insomnia 11/13/2013  . Knee pain, left 06/15/2017  . LEG PAIN, RIGHT 09/22/2009   Qualifier: Diagnosis of  By: Oneida Alar MD, KARL    . Malignant neoplasm of female breast ([place]) 02/07/2008   Qualifier: History of  By: Drue Flirt  MD, Merrily Brittle    Overview:  Overview:  Qualifier: History of  By: Drue Flirt  MD, Merrily Brittle  Overview:  Overview:  Qualifier: History of  By: Drue Flirt  MD, Merrily Brittle   . Metatarsal stress fracture 08/29/2012   This is at the distal shaft of the fifth metatarsal left foot   . Mild intermittent asthma without complication 1/96/2229  . Muscle tear 11/15/2012   Left rectus femoris; also like tendon tear   . Numbness and tingling of foot 03/29/2012  . Patellar tendinitis 03/07/2013   I suspect this occurred because she did less crosstraining before this marathon and was probably hamstring dominant   . Right foot pain 04/06/2016  . Right knee pain 04/23/2013   04/23/13  ultrasound today RT knee  revealed no significant effusion The quadriceps tendon is intact as is the patellar tendon The medial meniscus was normal The lateral meniscus had an area of calcification and a very small area of splitting along the midline to the posterior third of the joint line   . Rotator cuff tendinitis, left 09/08/2016   Overview:  Last Assessment & Plan:  Significant improvement  See OV summary  . Sensorineural hearing loss 11/13/2013  . Stress fracture of pelvis 08/29/2007   Qualifier: History of  By: Drue Flirt  MD, Merrily Brittle    . STRESS FRACTURE, TIBIA 06/15/2009   Qualifier: Diagnosis of  By: Drue Flirt  MD, Merrily Brittle    . Thrombocytopenia ([place]) 01/30/2014  . Tibialis tendinitis 08/12/2009   Qualifier: Diagnosis of  By: Oneida Alar MD, KARL    . Tinnitus 11/13/2013  . TROCHANTERIC BURSITIS, LEFT 04/27/2010   Qualifier: Diagnosis of  By: Ernestina Patches MD,  Remo Lipps    . UNEQUAL LEG LENGTH 09/18/2008   Qualifier: Diagnosis of  By: Oneida Alar MD, KARL      Past Surgical History:  Procedure Laterality Date  . ABDOMINAL SURGERY    . ABDOMINAL SURGERY     for removal of abdominal mass-cancerous  . KNEE SURGERY    . MANDIBLE FRACTURE SURGERY    . NASAL SINUS SURGERY    . TONSILLECTOMY      There were no vitals filed for this visit.  Subjective Assessment - 09/30/19 1626    Subjective  Pt. asking if she can use road bike.  Doing well today.  Pt. noting she spent most of the day outside working in garden on Saturday.  Had increased pain yesterday.    Patient Stated Goals  "get back to running w/o pain"    Currently in Pain?  Yes    Pain Score  0-No pain   pain up to 2/10 at worst   Pain Location  Buttocks    Pain Orientation  Left    Pain Descriptors / Indicators  Sore    Pain Type  Acute pain    Pain Onset  More than a month ago    Pain Frequency  Intermittent                        [facility] Adult PT Treatment/Exercise - 09/30/19 0001      Ambulation/Gait   Stairs  Yes    Stairs Assistance  5: Supervision    Stairs Assistance Details (indicate cue type and reason)  cues provided for slow eccentric descending with 1 rail use; pt. able to ascend stairs x 14 noting decreased L hip pain     Number of Stairs  14    Height of Stairs  7      Self-Care   Self-Care  Other Self-Care Comments    Other Self-Care Comments   Pt. requesting to ride road bike and wondering if this activity would be ok; encouraged pt. to try this activity and stop if it causes pain       Knee/Hip Exercises: Stretches   Passive Hamstring Stretch  Left;30 seconds;1 rep    Passive Hamstring Stretch Limitations  supine with strap     Hip Flexor Stretch  Left;30 seconds;2 reps    Hip Flexor Stretch Limitations  mod thomas position with strap     ITB Stretch  Left;1 rep;30 seconds    ITB Stretch Limitations  supine with strap  Knee/Hip Exercises:  Aerobic   Nustep  L6 x 6 min (UE/LE)       Knee/Hip Exercises: Standing   Hip Abduction  Right;Left;10 reps;Knee straight;Stengthening   Heavy cues to slow pace    Abduction Limitations  red looped TB at ankles; 1 ski poles     Hip Extension  Right;Left;10 reps;Knee straight;Stengthening    Extension Limitations  red looped TB at ankles; 2 ski poles     Step Down  Left;10 reps;Step Height: 4";Hand Hold: 2;Right    Step Down Limitations  B hip hike on 4" step    Other Standing Knee Exercises  B side stepping with red looped TB at ankles 2 x 30 ft    Heavy cues to slow pace      Knee/Hip Exercises: Prone   Hip Extension  Right;Left;10 reps;Strengthening   Heavy cues to slow pace    Hip Extension Limitations  quadruped bent knee hip extension    Other [place] Exercises  Quadruped fire hydrants 10 x 3 sec   Heavy cues to slow pace               PT Short Term Goals - 08/19/19 1754      PT SHORT TERM GOAL #1   Title  Patient will be independent with initial HEP    Status  Achieved   08/15/19       PT Long Term Goals - 09/19/19 1629      PT LONG TERM GOAL #1   Title  Patient will be independent with ongoing/advanced HEP    Status  Partially Met    Target Date  10/31/19      PT LONG TERM GOAL #2   Title  Patient to demonstrate improved tissue quality and pliability with reduced pain    Status  Partially Met    Target Date  10/31/19      PT LONG TERM GOAL #3   Title  Patient will demonstrate improved B proximal LE strength to >/= 4 to 4+/5 for improved stability and ease of mobility    Status  Partially Met    Target Date  10/31/19      PT LONG TERM GOAL #4   Title  Patient will improve walking tolerance to >/= 3-4 miles w/o pain interference to allow resumption of normal daily activities    Status  Partially Met   09/19/19 - able to walk 2-3 miles with "annoying soreness" but tolerable   Target Date  10/31/19      PT LONG TERM GOAL #5   Title  Patient will  negotiate stairs reciprocally with normal step pattern w/o limitation due to L hip pain or weakness    Status  Partially Met    Target Date  10/31/19      PT LONG TERM GOAL #6   Title  Patient will be able to resume light jogging/running up to 2 miles w/o L hip pain    Status  On-going    Target Date  10/31/19            Plan - 09/30/19 1631    Clinical Impression Statement  Rhonda [name] reporting she felt fine next day after last session however worked in garden for a few hours on Saturday and had some L hip soreness Sunday.  Feeling fine today.  Was able to continue progression of standing proximal hip strengthening activities today with typical report of increased L hip soreness 1/10 which resolved with sitting  rest.  Was able to navigate ascending/descending stairs reciprocally with 1 rail use with reduce L lateral hip pain.  Pt. did require heaving cueing for slow controlled pacing with quadruped "fire hydrant" and extension kickbacks.  Ended visit with pt. requesting to start riding road bike and encouraged to try this activity and stop if this causes pain.  Pt. verbalizing understanding.    Comorbidities  h/o L knee scope x 2 for meniscal debridement and cyst removal, Tibial tendinitis, Patellar tendinitis, R Achilles tendinitis, Injury of plantaris muscle or tendon, Tibial stress fracture, Pelvic stress fracture, Metatarsal stress fracture, Hammer toe, L trochanteric bursitis, L RCR, Asthma (mild intermittent asthma without complication), GERD, BPPV, Sensorineural hearing loss, Breast cancer, Basal cell carcinoma    Rehab Potential  Good    PT Frequency  2x / week    PT Treatment/Interventions  ADLs/Self Care Home Management;Cryotherapy;Electrical Stimulation;Moist Heat;Ultrasound;Gait training;Stair training;Functional mobility training;Therapeutic activities;Therapeutic exercise;Balance training;Neuromuscular re-education;Patient/family education;Manual techniques;Passive range of motion;Dry  needling;Taping;Joint Manipulations;Iontophoresis '4mg'$ /ml Dexamethasone       Patient will benefit from skilled therapeutic intervention in order to improve the following deficits and impairments:  Abnormal gait, Decreased activity tolerance, Decreased knowledge of precautions, Decreased mobility, Decreased strength, Difficulty walking, Increased edema, Increased fascial restricitons, Increased muscle spasms, Impaired perceived functional ability, Pain  Visit Diagnosis: Pain in left hip  Difficulty in walking, not elsewhere classified  Muscle weakness (generalized)  Other muscle spasm  Other symptoms and signs involving the musculoskeletal system     Problem List Patient Active Problem List   Diagnosis Date Noted  . GERD (gastroesophageal reflux disease) 05/07/2019  . Wrist pain, acute, left 04/23/2019  . Knee pain, left 06/15/2017  . Rotator cuff tendinitis, left 09/08/2016  . Right foot pain 04/06/2016  . Injury of plantaris muscle or tendon 10/30/2014  . Achilles tendinitis of right lower extremity 06/19/2014  . Thrombocytopenia ([place]) 01/30/2014  . Benign paroxysmal positional vertigo 11/13/2013  . Insomnia 11/13/2013  . Sensorineural hearing loss 11/13/2013  . Tinnitus 11/13/2013  . Hyperlipidemia LDL goal <130 10/24/2013  . Mild intermittent asthma without complication 16/38/4536  . Right knee pain 04/23/2013  . Patellar tendinitis 03/07/2013  . Muscle tear 11/15/2012  . Metatarsal stress fracture 08/29/2012  . Numbness and tingling of foot 03/29/2012  . Ankle pain 08/17/2011  . Chest pain in adult 08/17/2011  . TROCHANTERIC BURSITIS, LEFT 04/27/2010  . LEG PAIN, RIGHT 09/22/2009  . TIBIALIS TENDINITIS 08/12/2009  . STRESS FRACTURE, TIBIA 06/15/2009  . UNEQUAL LEG LENGTH 09/18/2008  . CARCINOMA, BASAL CELL 02/07/2008  . Malignant neoplasm of female breast ([place]) 02/07/2008  . STRESS FRACTURE OF PELVIS 08/29/2007  . HAMMER TOE, OTHER, ACQUIRED 10/13/2006  .  ALLERGIC RHINITIS 10/12/2006  . Asthma 10/12/2006  . Allergic rhinitis 10/12/2006    Bess Harvest, PTA 09/30/19 6:07 PM   [facility] [address]  [place], [place], [address] Phone: [phone]   Fax:  [phone]  Name: Rhonda [name] MRN: [id number] Date of Birth: 1951/01/09

## 2019-10-03 ENCOUNTER — Encounter: Payer: Self-pay | Admitting: Physical Therapy

## 2019-10-03 ENCOUNTER — Ambulatory Visit: Payer: 59 | Admitting: Physical Therapy

## 2019-10-03 ENCOUNTER — Other Ambulatory Visit: Payer: Self-pay

## 2019-10-03 DIAGNOSIS — M6281 Muscle weakness (generalized): Secondary | ICD-10-CM | POA: Diagnosis not present

## 2019-10-03 DIAGNOSIS — M25552 Pain in left hip: Secondary | ICD-10-CM | POA: Diagnosis not present

## 2019-10-03 DIAGNOSIS — R262 Difficulty in walking, not elsewhere classified: Secondary | ICD-10-CM | POA: Diagnosis not present

## 2019-10-03 DIAGNOSIS — R29898 Other symptoms and signs involving the musculoskeletal system: Secondary | ICD-10-CM

## 2019-10-03 DIAGNOSIS — M62838 Other muscle spasm: Secondary | ICD-10-CM | POA: Diagnosis not present

## 2019-10-03 NOTE — Therapy (Signed)
[facility] [address]  [place], [place], [address] Phone: [phone]   Fax:  [phone]  Physical Therapy Treatment  Patient Details  Name: Rhonda [name] MRN: [id number] Date of Birth: 03/01/1951 Referring Provider (PT): Karlton Lemon, MD   Encounter Date: 10/03/2019  PT End of Session - 10/03/19 1620    Visit Number  16    Number of Visits  24    Date for PT Re-Evaluation  10/31/19    Authorization Type  [facility]    Progress Note Due on Visit  22    PT Start Time  1620    PT Stop Time  1720    PT Time Calculation (min)  60 min    Activity Tolerance  Patient tolerated treatment well    Behavior During Therapy  [facility] for tasks assessed/performed       Past Medical History:  Diagnosis Date  . Achilles tendinitis of right lower extremity 06/19/2014   US showed minor changes in December 15  Most sxs seem to be enthesopathy  Note had reaction to NTG in past  Overview:  Overview:  US showed minor changes in December 15  Most sxs seem to be enthesopathy  Note had reaction to NTG in past  Last Assessment & Plan:  Recent irritation in the race appears to be a partial plantaris tear  This continues to give her some Achilles symptoms  I think a period  . ALLERGIC RHINITIS 10/12/2006   Qualifier: Diagnosis of  By: McDiarmid MD, Sherren Mocha    . Ankle pain 08/17/2011  . Asthma 10/12/2006   Qualifier: Diagnosis of  By: McDiarmid MD, Sherren Mocha    Overview:  Overview:  Qualifier: Diagnosis of  By: McDiarmid MD, Sherren Mocha   . CARCINOMA, BASAL CELL 02/07/2008   Qualifier: History of  By: Drue Flirt  MD, Merrily Brittle    Overview:  Overview:  Qualifier: History of  By: Drue Flirt  MD, Merrily Brittle   Overview:  Qualifier: History of  By: Drue Flirt  MD, Merrily Brittle   . Chest pain in adult 08/17/2011   This has worrisome features particularly with her level of exertion   . HAMMER TOE, OTHER, ACQUIRED 10/13/2006   Qualifier: Diagnosis of  By: McDiarmid MD, Sherren Mocha    Overview:   Overview:  Qualifier: Diagnosis of  By: McDiarmid MD, Sherren Mocha   . High cholesterol   . Hyperlipidemia LDL goal <130 10/24/2013  . Injury of plantaris muscle or tendon 10/30/2014  . Insomnia 11/13/2013  . Knee pain, left 06/15/2017  . LEG PAIN, RIGHT 09/22/2009   Qualifier: Diagnosis of  By: Oneida Alar MD, KARL    . Malignant neoplasm of female breast ([place]) 02/07/2008   Qualifier: History of  By: Drue Flirt  MD, Merrily Brittle    Overview:  Overview:  Qualifier: History of  By: Drue Flirt  MD, Merrily Brittle  Overview:  Overview:  Qualifier: History of  By: Drue Flirt  MD, Merrily Brittle   . Metatarsal stress fracture 08/29/2012   This is at the distal shaft of the fifth metatarsal left foot   . Mild intermittent asthma without complication 9/93/5701  . Muscle tear 11/15/2012   Left rectus femoris; also like tendon tear   . Numbness and tingling of foot 03/29/2012  . Patellar tendinitis 03/07/2013   I suspect this occurred because she did less crosstraining before this marathon and was probably hamstring dominant   . Right foot pain 04/06/2016  . Right knee pain 04/23/2013   04/23/13  ultrasound today RT knee  revealed no significant effusion The quadriceps tendon is intact as is the patellar tendon The medial meniscus was normal The lateral meniscus had an area of calcification and a very small area of splitting along the midline to the posterior third of the joint line   . Rotator cuff tendinitis, left 09/08/2016   Overview:  Last Assessment & Plan:  Significant improvement  See OV summary  . Sensorineural hearing loss 11/13/2013  . Stress fracture of pelvis 08/29/2007   Qualifier: History of  By: Drue Flirt  MD, Merrily Brittle    . STRESS FRACTURE, TIBIA 06/15/2009   Qualifier: Diagnosis of  By: Drue Flirt  MD, Merrily Brittle    . Thrombocytopenia ([place]) 01/30/2014  . Tibialis tendinitis 08/12/2009   Qualifier: Diagnosis of  By: Oneida Alar MD, KARL    . Tinnitus 11/13/2013  . TROCHANTERIC BURSITIS, LEFT 04/27/2010   Qualifier: Diagnosis of  By: Ernestina Patches MD,  Remo Lipps    . UNEQUAL LEG LENGTH 09/18/2008   Qualifier: Diagnosis of  By: Oneida Alar MD, KARL      Past Surgical History:  Procedure Laterality Date  . ABDOMINAL SURGERY    . ABDOMINAL SURGERY     for removal of abdominal mass-cancerous  . KNEE SURGERY    . MANDIBLE FRACTURE SURGERY    . NASAL SINUS SURGERY    . TONSILLECTOMY      There were no vitals filed for this visit.  Subjective Assessment - 10/03/19 1623    Subjective  Pt reports she did not do her morning exercises today but has walked 2.5 miles this morning and again at lunch. She plans to try riding a bike over the weekend with Dr. Ericka Pontiff approval (but "no [place]"). MD wants her to try running on Monday, then report back to him with how she does.    Patient Stated Goals  "get back to running w/o pain"    Currently in Pain?  Yes    Pain Score  1     Pain Location  Buttocks    Pain Orientation  Left    Pain Descriptors / Indicators  Sore    Pain Type  Acute pain    Pain Frequency  Intermittent                        [facility] Adult PT Treatment/Exercise - 10/03/19 1620      Knee/Hip Exercises: Stretches   Sports administrator  Left;30 seconds;2 reps    Sports administrator Limitations  prone RF with strap    ITB Stretch  Left;30 seconds;2 reps    ITB Stretch Limitations  manual by PT - supine cross body    Other Knee/Hip Stretches  L hip adductor stretch x 30 sec - manual by PT - supine      Knee/Hip Exercises: Aerobic   Nustep  L6 x 6 min (UE/LE)       Modalities   Modalities  Electrical Stimulation;Moist Heat      Moist Heat Therapy   Number Minutes Moist Heat  15 Minutes    Moist Heat Location  Other (comment)   L anterior/medial/lateral thigh     Electrical Stimulation   Electrical Stimulation Location  L anterior/medial/lateral thigh - adductors to ITB    Electrical Stimulation Action  IFC    Electrical Stimulation Parameters  80-150 Hz, intensity to pt tolerance x 15'    Electrical Stimulation  Goals  Pain;Tone      Manual  Therapy   Manual Therapy  Soft tissue mobilization;Myofascial release;Passive ROM    Manual therapy comments  supine & R side lying with L LE supported on bolster    Soft tissue mobilization  STM/DTM to L ITB, lateral quads & HS, hip flexors and distal hip adductors    Myofascial Release  manual TPR to L distal VL and lateral HS; pin & stretch to L ITB & hip adductors    Passive ROM  manual stretching as above               PT Short Term Goals - 08/19/19 1754      PT SHORT TERM GOAL #1   Title  Patient will be independent with initial HEP    Status  Achieved   08/15/19       PT Long Term Goals - 09/19/19 1629      PT LONG TERM GOAL #1   Title  Patient will be independent with ongoing/advanced HEP    Status  Partially Met    Target Date  10/31/19      PT LONG TERM GOAL #2   Title  Patient to demonstrate improved tissue quality and pliability with reduced pain    Status  Partially Met    Target Date  10/31/19      PT LONG TERM GOAL #3   Title  Patient will demonstrate improved B proximal LE strength to >/= 4 to 4+/5 for improved stability and ease of mobility    Status  Partially Met    Target Date  10/31/19      PT LONG TERM GOAL #4   Title  Patient will improve walking tolerance to >/= 3-4 miles w/o pain interference to allow resumption of normal daily activities    Status  Partially Met   09/19/19 - able to walk 2-3 miles with "annoying soreness" but tolerable   Target Date  10/31/19      PT LONG TERM GOAL #5   Title  Patient will negotiate stairs reciprocally with normal step pattern w/o limitation due to L hip pain or weakness    Status  Partially Met    Target Date  10/31/19      PT LONG TERM GOAL #6   Title  Patient will be able to resume light jogging/running up to 2 miles w/o L hip pain    Status  On-going    Target Date  10/31/19            Plan - 10/03/19 1628    Clinical Impression Statement  [name] reports  skipped her morning exercises today due to two 2.5-mile walks in the morning and at lunch to avoid flaring up as she did last week with similar walking frequency and reports only mild soreness currently. She indicates plan to start riding a bike over the weekend and resume running with MD approval as of Monday. Her biggest complaint today is ongoing tightness which now seems to have migrated more into her L ITB, lateral HS and quads - taut bands and TPs identified mostly mid and distal in theses areas with greatest restriction in lateral quads. Manual therapy addressing this revealing continued significant ttp with limited relief achieved, therefore session concluded with trial of estim and moist heat to promote further muscle relaxation.    Personal Factors and Comorbidities  Comorbidity 3+;Time since onset of injury/illness/exacerbation;Past/Current Experience;Age;Profession;Fitness    Comorbidities  h/o L knee scope x 2 for meniscal debridement and cyst removal, Tibial  tendinitis, Patellar tendinitis, R Achilles tendinitis, Injury of plantaris muscle or tendon, Tibial stress fracture, Pelvic stress fracture, Metatarsal stress fracture, Hammer toe, L trochanteric bursitis, L RCR, Asthma (mild intermittent asthma without complication), GERD, BPPV, Sensorineural hearing loss, Breast cancer, Basal cell carcinoma    Examination-Activity Limitations  Locomotion Level;Sleep;Stairs;Transfers    Examination-Participation Restrictions  Community Activity;Other    Rehab Potential  Good    PT Frequency  2x / week    PT Duration  6 weeks    PT Treatment/Interventions  ADLs/Self Care Home Management;Cryotherapy;Electrical Stimulation;Moist Heat;Ultrasound;Gait training;Stair training;Functional mobility training;Therapeutic activities;Therapeutic exercise;Balance training;Neuromuscular re-education;Patient/family education;Manual techniques;Passive range of motion;Dry needling;Taping;Joint Manipulations;Iontophoresis  '4mg'$ /ml Dexamethasone    PT Next Visit Plan  progress proximal LE strengthening including glute minumus and medius, L hip flexibilty/stretching, manual STM/MFR, modalities PRN    PT Home Exercise Plan  08/12/19 - standing & supine ITB stretches,  kneel hip flexor stretch, standing and sidelying glute med strengthening, yellow TB psoas march; 08/23/19 - seated yellow TB hip ABD; 08/26/19 - standing yellow TB 3-way hip SLR, squat + yellow TB hip abduction isometric; 09/04/19 - glute ball massage with ball on wall; 09/09/19 - quadruped hip extension, ext/abd & fire hydrants       Patient will benefit from skilled therapeutic intervention in order to improve the following deficits and impairments:  Abnormal gait, Decreased activity tolerance, Decreased knowledge of precautions, Decreased mobility, Decreased strength, Difficulty walking, Increased edema, Increased fascial restricitons, Increased muscle spasms, Impaired perceived functional ability, Pain  Visit Diagnosis: Pain in left hip  Difficulty in walking, not elsewhere classified  Muscle weakness (generalized)  Other muscle spasm  Other symptoms and signs involving the musculoskeletal system     Problem List Patient Active Problem List   Diagnosis Date Noted  . GERD (gastroesophageal reflux disease) 05/07/2019  . Wrist pain, acute, left 04/23/2019  . Knee pain, left 06/15/2017  . Rotator cuff tendinitis, left 09/08/2016  . Right foot pain 04/06/2016  . Injury of plantaris muscle or tendon 10/30/2014  . Achilles tendinitis of right lower extremity 06/19/2014  . Thrombocytopenia ([place]) 01/30/2014  . Benign paroxysmal positional vertigo 11/13/2013  . Insomnia 11/13/2013  . Sensorineural hearing loss 11/13/2013  . Tinnitus 11/13/2013  . Hyperlipidemia LDL goal <130 10/24/2013  . Mild intermittent asthma without complication 58/34/6219  . Right knee pain 04/23/2013  . Patellar tendinitis 03/07/2013  . Muscle tear 11/15/2012  .  Metatarsal stress fracture 08/29/2012  . Numbness and tingling of foot 03/29/2012  . Ankle pain 08/17/2011  . Chest pain in adult 08/17/2011  . TROCHANTERIC BURSITIS, LEFT 04/27/2010  . LEG PAIN, RIGHT 09/22/2009  . TIBIALIS TENDINITIS 08/12/2009  . STRESS FRACTURE, TIBIA 06/15/2009  . UNEQUAL LEG LENGTH 09/18/2008  . CARCINOMA, BASAL CELL 02/07/2008  . Malignant neoplasm of female breast ([place]) 02/07/2008  . STRESS FRACTURE OF PELVIS 08/29/2007  . HAMMER TOE, OTHER, ACQUIRED 10/13/2006  . ALLERGIC RHINITIS 10/12/2006  . Asthma 10/12/2006  . Allergic rhinitis 10/12/2006    Percival Spanish, PT, MPT 10/03/2019, 7:32 PM  [facility] [address]  [place], [place], [address] Phone: [phone]   Fax:  [phone]  Name: FELIPE CABELL MRN: [id number] Date of Birth: June 28, 1950

## 2019-10-08 ENCOUNTER — Ambulatory Visit: Payer: 59 | Attending: Family Medicine | Admitting: Physical Therapy

## 2019-10-08 ENCOUNTER — Other Ambulatory Visit: Payer: Self-pay

## 2019-10-08 ENCOUNTER — Encounter: Payer: Self-pay | Admitting: Physical Therapy

## 2019-10-08 DIAGNOSIS — M62838 Other muscle spasm: Secondary | ICD-10-CM | POA: Insufficient documentation

## 2019-10-08 DIAGNOSIS — M6281 Muscle weakness (generalized): Secondary | ICD-10-CM | POA: Diagnosis not present

## 2019-10-08 DIAGNOSIS — R29898 Other symptoms and signs involving the musculoskeletal system: Secondary | ICD-10-CM | POA: Insufficient documentation

## 2019-10-08 DIAGNOSIS — M25552 Pain in left hip: Secondary | ICD-10-CM | POA: Diagnosis not present

## 2019-10-08 DIAGNOSIS — R262 Difficulty in walking, not elsewhere classified: Secondary | ICD-10-CM | POA: Insufficient documentation

## 2019-10-08 NOTE — Therapy (Signed)
[facility] [address]  [place], [place], [address] Phone: [phone]   Fax:  [phone]  Physical Therapy Treatment  Patient Details  Name: Rhonda [name] MRN: [id number] Date of Birth: Jul 10, 1950 Referring Provider (PT): Karlton Lemon, MD   Encounter Date: 10/08/2019  PT End of Session - 10/08/19 1611    Visit Number  17    Number of Visits  24    Date for PT Re-Evaluation  10/31/19    Authorization Type  [facility]    Progress Note Due on Visit  22    PT Start Time  1611    PT Stop Time  1700    PT Time Calculation (min)  49 min    Activity Tolerance  Patient tolerated treatment well    Behavior During Therapy  [facility] for tasks assessed/performed       Past Medical History:  Diagnosis Date  . Achilles tendinitis of right lower extremity 06/19/2014   US showed minor changes in December 15  Most sxs seem to be enthesopathy  Note had reaction to NTG in past  Overview:  Overview:  US showed minor changes in December 15  Most sxs seem to be enthesopathy  Note had reaction to NTG in past  Last Assessment & Plan:  Recent irritation in the race appears to be a partial plantaris tear  This continues to give her some Achilles symptoms  I think a period  . ALLERGIC RHINITIS 10/12/2006   Qualifier: Diagnosis of  By: McDiarmid MD, Sherren Mocha    . Ankle pain 08/17/2011  . Asthma 10/12/2006   Qualifier: Diagnosis of  By: McDiarmid MD, Sherren Mocha    Overview:  Overview:  Qualifier: Diagnosis of  By: McDiarmid MD, Sherren Mocha   . CARCINOMA, BASAL CELL 02/07/2008   Qualifier: History of  By: Drue Flirt  MD, Merrily Brittle    Overview:  Overview:  Qualifier: History of  By: Drue Flirt  MD, Merrily Brittle   Overview:  Qualifier: History of  By: Drue Flirt  MD, Merrily Brittle   . Chest pain in adult 08/17/2011   This has worrisome features particularly with her level of exertion   . HAMMER TOE, OTHER, ACQUIRED 10/13/2006   Qualifier: Diagnosis of  By: McDiarmid MD, Sherren Mocha    Overview:   Overview:  Qualifier: Diagnosis of  By: McDiarmid MD, Sherren Mocha   . High cholesterol   . Hyperlipidemia LDL goal <130 10/24/2013  . Injury of plantaris muscle or tendon 10/30/2014  . Insomnia 11/13/2013  . Knee pain, left 06/15/2017  . LEG PAIN, RIGHT 09/22/2009   Qualifier: Diagnosis of  By: Oneida Alar MD, KARL    . Malignant neoplasm of female breast ([place]) 02/07/2008   Qualifier: History of  By: Drue Flirt  MD, Merrily Brittle    Overview:  Overview:  Qualifier: History of  By: Drue Flirt  MD, Merrily Brittle  Overview:  Overview:  Qualifier: History of  By: Drue Flirt  MD, Merrily Brittle   . Metatarsal stress fracture 08/29/2012   This is at the distal shaft of the fifth metatarsal left foot   . Mild intermittent asthma without complication 0/05/7492  . Muscle tear 11/15/2012   Left rectus femoris; also like tendon tear   . Numbness and tingling of foot 03/29/2012  . Patellar tendinitis 03/07/2013   I suspect this occurred because she did less crosstraining before this marathon and was probably hamstring dominant   . Right foot pain 04/06/2016  . Right knee pain 04/23/2013   04/23/13  ultrasound today RT knee  revealed no significant effusion The quadriceps tendon is intact as is the patellar tendon The medial meniscus was normal The lateral meniscus had an area of calcification and a very small area of splitting along the midline to the posterior third of the joint line   . Rotator cuff tendinitis, left 09/08/2016   Overview:  Last Assessment & Plan:  Significant improvement  See OV summary  . Sensorineural hearing loss 11/13/2013  . Stress fracture of pelvis 08/29/2007   Qualifier: History of  By: Drue Flirt  MD, Merrily Brittle    . STRESS FRACTURE, TIBIA 06/15/2009   Qualifier: Diagnosis of  By: Drue Flirt  MD, Merrily Brittle    . Thrombocytopenia ([place]) 01/30/2014  . Tibialis tendinitis 08/12/2009   Qualifier: Diagnosis of  By: Oneida Alar MD, KARL    . Tinnitus 11/13/2013  . TROCHANTERIC BURSITIS, LEFT 04/27/2010   Qualifier: Diagnosis of  By: Ernestina Patches MD,  Remo Lipps    . UNEQUAL LEG LENGTH 09/18/2008   Qualifier: Diagnosis of  By: Oneida Alar MD, KARL      Past Surgical History:  Procedure Laterality Date  . ABDOMINAL SURGERY    . ABDOMINAL SURGERY     for removal of abdominal mass-cancerous  . KNEE SURGERY    . MANDIBLE FRACTURE SURGERY    . NASAL SINUS SURGERY    . TONSILLECTOMY      There were no vitals filed for this visit.  Subjective Assessment - 10/08/19 1620    Subjective  Pt reports she tried a 1-mile run yesterday followed by a 1.5-mile walk - has some discomfort and did not think she would have made 5 miles, but probably could have done 3 miles. Noted increasing soreness as the day progressed and again today after periods of inactivity but then subsides as she gets moving again.    Patient Stated Goals  "get back to running w/o pain"    Currently in Pain?  Yes    Pain Score  --   2-3/10   Pain Location  Buttocks   & anterior upper thigh ("moves around")   Pain Orientation  Left    Pain Descriptors / Indicators  Discomfort    Pain Frequency  Intermittent         [facility] PT Assessment - 10/08/19 1611      Assessment   Medical Diagnosis  L hip pain - TFL strain    Referring Provider (PT)  Karlton Lemon, MD    Next MD Visit  11/04/19                    [facility] Adult PT Treatment/Exercise - 10/08/19 1611      Exercises   Exercises  Knee/Hip      Knee/Hip Exercises: Stretches   Hip Flexor Stretch  Left;30 seconds;2 reps    Hip Flexor Stretch Limitations  mod thomas with strap     ITB Stretch  Left;30 seconds;2 reps    ITB Stretch Limitations  supine with strap     Piriformis Stretch  Left;30 seconds;2 reps    Piriformis Stretch Limitations  supine KTOS & figure 4 to chest    Other Knee/Hip Stretches  L longsitting ITB/piriformis stretch x 30 sec      Knee/Hip Exercises: Aerobic   Nustep  L5 x 6 min (UE/LE)       Knee/Hip Exercises: Standing   Other Standing Knee Exercises  B side stepping & fwd/back  monster walk with red looped TB at ankles  2 x 30 ft       Knee/Hip Exercises: Supine   Bridges with Ball Squeeze  Both;10 reps;Strengthening;Right;Left   + alt knee extension   Bridges with Clamshell  Both;10 reps;Strengthening   red TB B hip ABD/ER   Other Supine Knee/Hip Exercises  Alt psoas march with looped yellow TB at midfoot with heels propped on bolster x10    Other Supine Knee/Hip Exercises  Bridge march with red TB at knees x10      Manual Therapy   Manual Therapy  Soft tissue mobilization;Myofascial release    Manual therapy comments  supine    Soft tissue mobilization  STM/DTM to L ITB, lateral quads, hip flexors    Myofascial Release  manual TPR to L iliopsoas; pin & stretch to L ITB & lateral quads               PT Short Term Goals - 08/19/19 1754      PT SHORT TERM GOAL #1   Title  Patient will be independent with initial HEP    Status  Achieved   08/15/19       PT Long Term Goals - 09/19/19 1629      PT LONG TERM GOAL #1   Title  Patient will be independent with ongoing/advanced HEP    Status  Partially Met    Target Date  10/31/19      PT LONG TERM GOAL #2   Title  Patient to demonstrate improved tissue quality and pliability with reduced pain    Status  Partially Met    Target Date  10/31/19      PT LONG TERM GOAL #3   Title  Patient will demonstrate improved B proximal LE strength to >/= 4 to 4+/5 for improved stability and ease of mobility    Status  Partially Met    Target Date  10/31/19      PT LONG TERM GOAL #4   Title  Patient will improve walking tolerance to >/= 3-4 miles w/o pain interference to allow resumption of normal daily activities    Status  Partially Met   09/19/19 - able to walk 2-3 miles with "annoying soreness" but tolerable   Target Date  10/31/19      PT LONG TERM GOAL #5   Title  Patient will negotiate stairs reciprocally with normal step pattern w/o limitation due to L hip pain or weakness    Status  Partially Met     Target Date  10/31/19      PT LONG TERM GOAL #6   Title  Patient will be able to resume light jogging/running up to 2 miles w/o L hip pain    Status  On-going    Target Date  10/31/19            Plan - 10/08/19 1627    Clinical Impression Statement  [name] reports she initiated running again yesterday with a 1-mile run. Some pain/discomfort noted while running but worsening muscle soreness as the day progressed and again today although pain today more upon initiation of mobility then subsiding as she gets moving, consistent with DOMS. Guided patient through stretches and strengthening exercises to promote reduced pain and normalization of muscle tension with good tolerance and no increased pain reported. Encouraged pt to keep up with home stretches and take it easy on strengthening exercises as she continues to ease back into running, allowing for rest/recovery days.    Personal Factors and Comorbidities  Comorbidity 3+;Time since onset of injury/illness/exacerbation;Past/Current Experience;Age;Profession;Fitness    Comorbidities  h/o L knee scope x 2 for meniscal debridement and cyst removal, Tibial tendinitis, Patellar tendinitis, R Achilles tendinitis, Injury of plantaris muscle or tendon, Tibial stress fracture, Pelvic stress fracture, Metatarsal stress fracture, Hammer toe, L trochanteric bursitis, L RCR, Asthma (mild intermittent asthma without complication), GERD, BPPV, Sensorineural hearing loss, Breast cancer, Basal cell carcinoma    Examination-Activity Limitations  Locomotion Level;Sleep;Stairs;Transfers    Examination-Participation Restrictions  Community Activity;Other    Rehab Potential  Good    PT Frequency  2x / week    PT Duration  6 weeks    PT Treatment/Interventions  ADLs/Self Care Home Management;Cryotherapy;Electrical Stimulation;Moist Heat;Ultrasound;Gait training;Stair training;Functional mobility training;Therapeutic activities;Therapeutic exercise;Balance  training;Neuromuscular re-education;Patient/family education;Manual techniques;Passive range of motion;Dry needling;Taping;Joint Manipulations;Iontophoresis '4mg'$ /ml Dexamethasone    PT Next Visit Plan  progress proximal LE strengthening including glute minumus and medius, L hip flexibilty/stretching, manual STM/MFR, modalities PRN    PT Home Exercise Plan  08/12/19 - standing & supine ITB stretches,  kneel hip flexor stretch, standing and sidelying glute med strengthening, yellow TB psoas march; 08/23/19 - seated yellow TB hip ABD; 08/26/19 - standing yellow TB 3-way hip SLR, squat + yellow TB hip abduction isometric; 09/04/19 - glute ball massage with ball on wall; 09/09/19 - quadruped hip extension, ext/abd & fire hydrants       Patient will benefit from skilled therapeutic intervention in order to improve the following deficits and impairments:  Abnormal gait, Decreased activity tolerance, Decreased knowledge of precautions, Decreased mobility, Decreased strength, Difficulty walking, Increased edema, Increased fascial restricitons, Increased muscle spasms, Impaired perceived functional ability, Pain  Visit Diagnosis: Pain in left hip  Difficulty in walking, not elsewhere classified  Muscle weakness (generalized)  Other muscle spasm  Other symptoms and signs involving the musculoskeletal system     Problem List Patient Active Problem List   Diagnosis Date Noted  . GERD (gastroesophageal reflux disease) 05/07/2019  . Wrist pain, acute, left 04/23/2019  . Knee pain, left 06/15/2017  . Rotator cuff tendinitis, left 09/08/2016  . Right foot pain 04/06/2016  . Injury of plantaris muscle or tendon 10/30/2014  . Achilles tendinitis of right lower extremity 06/19/2014  . Thrombocytopenia ([place]) 01/30/2014  . Benign paroxysmal positional vertigo 11/13/2013  . Insomnia 11/13/2013  . Sensorineural hearing loss 11/13/2013  . Tinnitus 11/13/2013  . Hyperlipidemia LDL goal <130 10/24/2013  . Mild  intermittent asthma without complication 81/44/8185  . Right knee pain 04/23/2013  . Patellar tendinitis 03/07/2013  . Muscle tear 11/15/2012  . Metatarsal stress fracture 08/29/2012  . Numbness and tingling of foot 03/29/2012  . Ankle pain 08/17/2011  . Chest pain in adult 08/17/2011  . TROCHANTERIC BURSITIS, LEFT 04/27/2010  . LEG PAIN, RIGHT 09/22/2009  . TIBIALIS TENDINITIS 08/12/2009  . STRESS FRACTURE, TIBIA 06/15/2009  . UNEQUAL LEG LENGTH 09/18/2008  . CARCINOMA, BASAL CELL 02/07/2008  . Malignant neoplasm of female breast ([place]) 02/07/2008  . STRESS FRACTURE OF PELVIS 08/29/2007  . HAMMER TOE, OTHER, ACQUIRED 10/13/2006  . ALLERGIC RHINITIS 10/12/2006  . Asthma 10/12/2006  . Allergic rhinitis 10/12/2006    Percival Spanish, PT, MPT 10/08/2019, 7:12 PM  [facility] [address]  [place], [place], [address] Phone: [phone]   Fax:  [phone]  Name: GRACEMARIE SKEET MRN: [id number] Date of Birth: [birth year]

## 2019-10-09 ENCOUNTER — Ambulatory Visit: Payer: 59 | Admitting: Family Medicine

## 2019-10-10 ENCOUNTER — Other Ambulatory Visit: Payer: Self-pay

## 2019-10-10 ENCOUNTER — Ambulatory Visit: Payer: 59

## 2019-10-10 DIAGNOSIS — M62838 Other muscle spasm: Secondary | ICD-10-CM

## 2019-10-10 DIAGNOSIS — M6281 Muscle weakness (generalized): Secondary | ICD-10-CM | POA: Diagnosis not present

## 2019-10-10 DIAGNOSIS — M25552 Pain in left hip: Secondary | ICD-10-CM

## 2019-10-10 DIAGNOSIS — R29898 Other symptoms and signs involving the musculoskeletal system: Secondary | ICD-10-CM

## 2019-10-10 DIAGNOSIS — R262 Difficulty in walking, not elsewhere classified: Secondary | ICD-10-CM | POA: Diagnosis not present

## 2019-10-10 NOTE — Therapy (Signed)
[facility] [address]  [place], [place], [address] Phone: [phone]   Fax:  [phone]  Physical Therapy Treatment  Patient Details  Name: Rhonda [name] MRN: [id number] Date of Birth: [birth year] Referring Provider (PT): Karlton Lemon, MD   Encounter Date: 10/10/2019  PT End of Session - 10/10/19 1633    Visit Number  18    Number of Visits  24    Date for PT Re-Evaluation  10/31/19    Authorization Type  [facility]    Progress Note Due on Visit  22    PT Start Time  1625    PT Stop Time  1706    PT Time Calculation (min)  41 min    Activity Tolerance  Patient tolerated treatment well    Behavior During Therapy  [facility] for tasks assessed/performed       Past Medical History:  Diagnosis Date  . Achilles tendinitis of right lower extremity 06/19/2014   US showed minor changes in December 15  Most sxs seem to be enthesopathy  Note had reaction to NTG in past  Overview:  Overview:  US showed minor changes in December 15  Most sxs seem to be enthesopathy  Note had reaction to NTG in past  Last Assessment & Plan:  Recent irritation in the race appears to be a partial plantaris tear  This continues to give her some Achilles symptoms  I think a period  . ALLERGIC RHINITIS 10/12/2006   Qualifier: Diagnosis of  By: McDiarmid MD, Sherren Mocha    . Ankle pain 08/17/2011  . Asthma 10/12/2006   Qualifier: Diagnosis of  By: McDiarmid MD, Sherren Mocha    Overview:  Overview:  Qualifier: Diagnosis of  By: McDiarmid MD, Sherren Mocha   . CARCINOMA, BASAL CELL 02/07/2008   Qualifier: History of  By: Drue Flirt  MD, Merrily Brittle    Overview:  Overview:  Qualifier: History of  By: Drue Flirt  MD, Merrily Brittle   Overview:  Qualifier: History of  By: Drue Flirt  MD, Merrily Brittle   . Chest pain in adult 08/17/2011   This has worrisome features particularly with her level of exertion   . HAMMER TOE, OTHER, ACQUIRED 10/13/2006   Qualifier: Diagnosis of  By: McDiarmid MD, Sherren Mocha    Overview:   Overview:  Qualifier: Diagnosis of  By: McDiarmid MD, Sherren Mocha   . High cholesterol   . Hyperlipidemia LDL goal <130 10/24/2013  . Injury of plantaris muscle or tendon 10/30/2014  . Insomnia 11/13/2013  . Knee pain, left 06/15/2017  . LEG PAIN, RIGHT 09/22/2009   Qualifier: Diagnosis of  By: Oneida Alar MD, KARL    . Malignant neoplasm of female breast ([place]) 02/07/2008   Qualifier: History of  By: Drue Flirt  MD, Merrily Brittle    Overview:  Overview:  Qualifier: History of  By: Drue Flirt  MD, Merrily Brittle  Overview:  Overview:  Qualifier: History of  By: Drue Flirt  MD, Merrily Brittle   . Metatarsal stress fracture 08/29/2012   This is at the distal shaft of the fifth metatarsal left foot   . Mild intermittent asthma without complication 11/12/8673  . Muscle tear 11/15/2012   Left rectus femoris; also like tendon tear   . Numbness and tingling of foot 03/29/2012  . Patellar tendinitis 03/07/2013   I suspect this occurred because she did less crosstraining before this marathon and was probably hamstring dominant   . Right foot pain 04/06/2016  . Right knee pain 04/23/2013   04/23/13  ultrasound today RT knee  revealed no significant effusion The quadriceps tendon is intact as is the patellar tendon The medial meniscus was normal The lateral meniscus had an area of calcification and a very small area of splitting along the midline to the posterior third of the joint line   . Rotator cuff tendinitis, left 09/08/2016   Overview:  Last Assessment & Plan:  Significant improvement  See OV summary  . Sensorineural hearing loss 11/13/2013  . Stress fracture of pelvis 08/29/2007   Qualifier: History of  By: Drue Flirt  MD, Merrily Brittle    . STRESS FRACTURE, TIBIA 06/15/2009   Qualifier: Diagnosis of  By: Drue Flirt  MD, Merrily Brittle    . Thrombocytopenia ([place]) 01/30/2014  . Tibialis tendinitis 08/12/2009   Qualifier: Diagnosis of  By: Oneida Alar MD, KARL    . Tinnitus 11/13/2013  . TROCHANTERIC BURSITIS, LEFT 04/27/2010   Qualifier: Diagnosis of  By: Ernestina Patches MD,  Remo Lipps    . UNEQUAL LEG LENGTH 09/18/2008   Qualifier: Diagnosis of  By: Oneida Alar MD, KARL      Past Surgical History:  Procedure Laterality Date  . ABDOMINAL SURGERY    . ABDOMINAL SURGERY     for removal of abdominal mass-cancerous  . KNEE SURGERY    . MANDIBLE FRACTURE SURGERY    . NASAL SINUS SURGERY    . TONSILLECTOMY      There were no vitals filed for this visit.  Subjective Assessment - 10/10/19 1631    Subjective  Pt. reporting she ran 1 mile at an "11 min mile pace" - had some soreness.    Patient Stated Goals  "get back to running w/o pain"    Currently in Pain?  Yes    Pain Score  2     Pain Location  Buttocks    Pain Orientation  Left;Lateral    Pain Descriptors / Indicators  Sore    Pain Type  Acute pain    Aggravating Factors   climbing stairs normall ascending                        [facility] Adult PT Treatment/Exercise - 10/10/19 0001      Knee/Hip Exercises: Stretches   Passive Hamstring Stretch  Left;30 seconds;1 rep    Passive Hamstring Stretch Limitations  Manual with therapist     Hip Flexor Stretch  Left;30 seconds;2 reps    Hip Flexor Stretch Limitations  manual mod thomas position     ITB Stretch  Left;30 seconds;2 reps    ITB Stretch Limitations  Manual with therapist     Piriformis Stretch  Left;30 seconds;2 reps    Piriformis Stretch Limitations  Manual with therapist       Knee/Hip Exercises: Aerobic   Recumbent Bike  Lvl 2, 6 min       Knee/Hip Exercises: Standing   Other Standing Knee Exercises  B side stepping & fwd/back monster walk with green looped TB at ankles 2 x 20 ft       Knee/Hip Exercises: Supine   Bridges with Clamshell  Both;10 reps;Strengthening   sustained march with red looped TB around knees      Knee/Hip Exercises: Prone   Straight Leg Raises  Right;Left;10 reps;Strengthening   3" hold    Straight Leg Raises Limitations  quadruped hip extension + abduction      Manual Therapy   Manual Therapy  Soft  tissue mobilization    Soft tissue mobilization  Strumming/DTM  to L VL, lateral HS, glute medius, L TFL, L piriformis                PT Short Term Goals - 08/19/19 1754      PT SHORT TERM GOAL #1   Title  Patient will be independent with initial HEP    Status  Achieved   08/15/19       PT Long Term Goals - 09/19/19 1629      PT LONG TERM GOAL #1   Title  Patient will be independent with ongoing/advanced HEP    Status  Partially Met    Target Date  10/31/19      PT LONG TERM GOAL #2   Title  Patient to demonstrate improved tissue quality and pliability with reduced pain    Status  Partially Met    Target Date  10/31/19      PT LONG TERM GOAL #3   Title  Patient will demonstrate improved B proximal LE strength to >/= 4 to 4+/5 for improved stability and ease of mobility    Status  Partially Met    Target Date  10/31/19      PT LONG TERM GOAL #4   Title  Patient will improve walking tolerance to >/= 3-4 miles w/o pain interference to allow resumption of normal daily activities    Status  Partially Met   09/19/19 - able to walk 2-3 miles with "annoying soreness" but tolerable   Target Date  10/31/19      PT LONG TERM GOAL #5   Title  Patient will negotiate stairs reciprocally with normal step pattern w/o limitation due to L hip pain or weakness    Status  Partially Met    Target Date  10/31/19      PT LONG TERM GOAL #6   Title  Patient will be able to resume light jogging/running up to 2 miles w/o L hip pain    Status  On-going    Target Date  10/31/19            Plan - 10/10/19 1634    Clinical Impression Statement  Pt. reporting she ran for 1 mile yesterday at "11 min mile pace" with complaint of 2-3/10 soreness at L proximal thigh.  Pt. noting muscular soreness today which did not limit treatment session.  Pt. verbalizing frustration that she is still experiencing pain with running/jogging and encouraged by therapist to continue to adhere to jog distance  restrictions set forth by MD along with allowing proper rest/recovery as pt. with tendency to overtrain. Pt. verbalized understanding.  Pt. tolerated all proximal glute and LE strengthening well today.  Ended visit with pt. reporting she was pain free.    Comorbidities  h/o L knee scope x 2 for meniscal debridement and cyst removal, Tibial tendinitis, Patellar tendinitis, R Achilles tendinitis, Injury of plantaris muscle or tendon, Tibial stress fracture, Pelvic stress fracture, Metatarsal stress fracture, Hammer toe, L trochanteric bursitis, L RCR, Asthma (mild intermittent asthma without complication), GERD, BPPV, Sensorineural hearing loss, Breast cancer, Basal cell carcinoma    Rehab Potential  Good    PT Frequency  2x / week    PT Treatment/Interventions  ADLs/Self Care Home Management;Cryotherapy;Electrical Stimulation;Moist Heat;Ultrasound;Gait training;Stair training;Functional mobility training;Therapeutic activities;Therapeutic exercise;Balance training;Neuromuscular re-education;Patient/family education;Manual techniques;Passive range of motion;Dry needling;Taping;Joint Manipulations;Iontophoresis '4mg'$ /ml Dexamethasone    PT Next Visit Plan  progress proximal LE strengthening including glute minumus and medius, L hip flexibilty/stretching, manual STM/MFR, modalities PRN  PT Home Exercise Plan  08/12/19 - standing & supine ITB stretches,  kneel hip flexor stretch, standing and sidelying glute med strengthening, yellow TB psoas march; 08/23/19 - seated yellow TB hip ABD; 08/26/19 - standing yellow TB 3-way hip SLR, squat + yellow TB hip abduction isometric; 09/04/19 - glute ball massage with ball on wall; 09/09/19 - quadruped hip extension, ext/abd & fire hydrants    Consulted and Agree with Plan of Care  Patient       Patient will benefit from skilled therapeutic intervention in order to improve the following deficits and impairments:  Abnormal gait, Decreased activity tolerance, Decreased knowledge  of precautions, Decreased mobility, Decreased strength, Difficulty walking, Increased edema, Increased fascial restricitons, Increased muscle spasms, Impaired perceived functional ability, Pain  Visit Diagnosis: Pain in left hip  Difficulty in walking, not elsewhere classified  Muscle weakness (generalized)  Other muscle spasm  Other symptoms and signs involving the musculoskeletal system     Problem List Patient Active Problem List   Diagnosis Date Noted  . GERD (gastroesophageal reflux disease) 05/07/2019  . Wrist pain, acute, left 04/23/2019  . Knee pain, left 06/15/2017  . Rotator cuff tendinitis, left 09/08/2016  . Right foot pain 04/06/2016  . Injury of plantaris muscle or tendon 10/30/2014  . Achilles tendinitis of right lower extremity 06/19/2014  . Thrombocytopenia ([place]) 01/30/2014  . Benign paroxysmal positional vertigo 11/13/2013  . Insomnia 11/13/2013  . Sensorineural hearing loss 11/13/2013  . Tinnitus 11/13/2013  . Hyperlipidemia LDL goal <130 10/24/2013  . Mild intermittent asthma without complication 53/74/8270  . Right knee pain 04/23/2013  . Patellar tendinitis 03/07/2013  . Muscle tear 11/15/2012  . Metatarsal stress fracture 08/29/2012  . Numbness and tingling of foot 03/29/2012  . Ankle pain 08/17/2011  . Chest pain in adult 08/17/2011  . TROCHANTERIC BURSITIS, LEFT 04/27/2010  . LEG PAIN, RIGHT 09/22/2009  . TIBIALIS TENDINITIS 08/12/2009  . STRESS FRACTURE, TIBIA 06/15/2009  . UNEQUAL LEG LENGTH 09/18/2008  . CARCINOMA, BASAL CELL 02/07/2008  . Malignant neoplasm of female breast ([place]) 02/07/2008  . STRESS FRACTURE OF PELVIS 08/29/2007  . HAMMER TOE, OTHER, ACQUIRED 10/13/2006  . ALLERGIC RHINITIS 10/12/2006  . Asthma 10/12/2006  . Allergic rhinitis 10/12/2006    Bess Harvest, PTA 10/10/19 9:27 PM   [facility] [address]  [place], [place], [address] Phone: [phone]    Fax:  [phone]  Name: Rhonda [name] MRN: [id number] Date of Birth: Dec 06, 1950

## 2019-10-14 ENCOUNTER — Ambulatory Visit: Payer: 59 | Admitting: Physical Therapy

## 2019-10-17 ENCOUNTER — Other Ambulatory Visit: Payer: Self-pay

## 2019-10-17 ENCOUNTER — Ambulatory Visit: Payer: 59

## 2019-10-17 DIAGNOSIS — R29898 Other symptoms and signs involving the musculoskeletal system: Secondary | ICD-10-CM

## 2019-10-17 DIAGNOSIS — M62838 Other muscle spasm: Secondary | ICD-10-CM

## 2019-10-17 DIAGNOSIS — R262 Difficulty in walking, not elsewhere classified: Secondary | ICD-10-CM

## 2019-10-17 DIAGNOSIS — M6281 Muscle weakness (generalized): Secondary | ICD-10-CM

## 2019-10-17 DIAGNOSIS — M25552 Pain in left hip: Secondary | ICD-10-CM | POA: Diagnosis not present

## 2019-10-17 NOTE — Therapy (Signed)
[facility] [address]  [place], [place], [address] Phone: [phone]   Fax:  [phone]  Physical Therapy Treatment  Patient Details  Name: Rhonda [name] MRN: [id number] Date of Birth: 1951/02/24 Referring Provider (PT): Karlton Lemon, MD   Encounter Date: 10/17/2019   PT End of Session - 10/17/19 1533    Visit Number 19    Number of Visits 24    Date for PT Re-Evaluation 10/31/19    Authorization Type [facility]    Progress Note Due on Visit 22    PT Start Time 1529    PT Stop Time 1614    PT Time Calculation (min) 45 min    Activity Tolerance Patient tolerated treatment well    Behavior During Therapy [facility] for tasks assessed/performed           Past Medical History:  Diagnosis Date  . Achilles tendinitis of right lower extremity 06/19/2014   US showed minor changes in December 15  Most sxs seem to be enthesopathy  Note had reaction to NTG in past  Overview:  Overview:  US showed minor changes in December 15  Most sxs seem to be enthesopathy  Note had reaction to NTG in past  Last Assessment & Plan:  Recent irritation in the race appears to be a partial plantaris tear  This continues to give her some Achilles symptoms  I think a period  . ALLERGIC RHINITIS 10/12/2006   Qualifier: Diagnosis of  By: McDiarmid MD, Sherren Mocha    . Ankle pain 08/17/2011  . Asthma 10/12/2006   Qualifier: Diagnosis of  By: McDiarmid MD, Sherren Mocha    Overview:  Overview:  Qualifier: Diagnosis of  By: McDiarmid MD, Sherren Mocha   . CARCINOMA, BASAL CELL 02/07/2008   Qualifier: History of  By: Drue Flirt  MD, Merrily Brittle    Overview:  Overview:  Qualifier: History of  By: Drue Flirt  MD, Merrily Brittle   Overview:  Qualifier: History of  By: Drue Flirt  MD, Merrily Brittle   . Chest pain in adult 08/17/2011   This has worrisome features particularly with her level of exertion   . HAMMER TOE, OTHER, ACQUIRED 10/13/2006   Qualifier: Diagnosis of  By: McDiarmid MD, Sherren Mocha    Overview:   Overview:  Qualifier: Diagnosis of  By: McDiarmid MD, Sherren Mocha   . High cholesterol   . Hyperlipidemia LDL goal <130 10/24/2013  . Injury of plantaris muscle or tendon 10/30/2014  . Insomnia 11/13/2013  . Knee pain, left 06/15/2017  . LEG PAIN, RIGHT 09/22/2009   Qualifier: Diagnosis of  By: Oneida Alar MD, KARL    . Malignant neoplasm of female breast ([place]) 02/07/2008   Qualifier: History of  By: Drue Flirt  MD, Merrily Brittle    Overview:  Overview:  Qualifier: History of  By: Drue Flirt  MD, Merrily Brittle  Overview:  Overview:  Qualifier: History of  By: Drue Flirt  MD, Merrily Brittle   . Metatarsal stress fracture 08/29/2012   This is at the distal shaft of the fifth metatarsal left foot   . Mild intermittent asthma without complication 05/09/7508  . Muscle tear 11/15/2012   Left rectus femoris; also like tendon tear   . Numbness and tingling of foot 03/29/2012  . Patellar tendinitis 03/07/2013   I suspect this occurred because she did less crosstraining before this marathon and was probably hamstring dominant   . Right foot pain 04/06/2016  . Right knee pain 04/23/2013   04/23/13  ultrasound today RT knee  revealed no significant effusion The quadriceps tendon is intact as is the patellar tendon The medial meniscus was normal The lateral meniscus had an area of calcification and a very small area of splitting along the midline to the posterior third of the joint line   . Rotator cuff tendinitis, left 09/08/2016   Overview:  Last Assessment & Plan:  Significant improvement  See OV summary  . Sensorineural hearing loss 11/13/2013  . Stress fracture of pelvis 08/29/2007   Qualifier: History of  By: Drue Flirt  MD, Merrily Brittle    . STRESS FRACTURE, TIBIA 06/15/2009   Qualifier: Diagnosis of  By: Drue Flirt  MD, Merrily Brittle    . Thrombocytopenia ([place]) 01/30/2014  . Tibialis tendinitis 08/12/2009   Qualifier: Diagnosis of  By: Oneida Alar MD, KARL    . Tinnitus 11/13/2013  . TROCHANTERIC BURSITIS, LEFT 04/27/2010   Qualifier: Diagnosis of  By: Ernestina Patches MD,  Remo Lipps    . UNEQUAL LEG LENGTH 09/18/2008   Qualifier: Diagnosis of  By: Oneida Alar MD, KARL      Past Surgical History:  Procedure Laterality Date  . ABDOMINAL SURGERY    . ABDOMINAL SURGERY     for removal of abdominal mass-cancerous  . KNEE SURGERY    . MANDIBLE FRACTURE SURGERY    . NASAL SINUS SURGERY    . TONSILLECTOMY      There were no vitals filed for this visit.   Subjective Assessment - 10/17/19 1531    Subjective Pt. reports race on Saturday went well and she came in 2nd place for her age group.  notes L lateral hip pain rising to 3-4/10 during race.  Very sore in L LE on Sunday.    Patient Stated Goals "get back to running w/o pain"    Currently in Pain? No/denies    Pain Score 0-No pain   up to 3-4/10 during 5k race on Saturday.   Pain Location Buttocks    Pain Orientation Left;Lateral    Pain Descriptors / Indicators Sore    Pain Type Acute pain    Pain Onset More than a month ago    Pain Frequency Intermittent    Aggravating Factors  climbing stairs and running up hills    Multiple Pain Sites No              [facility] PT Assessment - 10/17/19 0001      Assessment   Medical Diagnosis L hip pain - TFL strain    Referring Provider (PT) Karlton Lemon, MD    Next MD Visit 11/04/19                         [facility] Adult PT Treatment/Exercise - 10/17/19 0001      Knee/Hip Exercises: Stretches   Passive Hamstring Stretch Left;30 seconds;1 rep    Passive Hamstring Stretch Limitations strap     Hip Flexor Stretch Left;30 seconds;2 reps    Hip Flexor Stretch Limitations half lunge stretch on foam pad and mod thomas pos    ITB Stretch Left;30 seconds;2 reps    ITB Stretch Limitations strap    Piriformis Stretch Left;30 seconds;2 reps    Piriformis Stretch Limitations strap      Knee/Hip Exercises: Aerobic   Nustep L5 x 6 min (UE/LE)       Knee/Hip Exercises: Machines for Strengthening   Cybex Leg Press 20# L single leg leg press x 20 reps        Knee/Hip Exercises: Standing  Other Standing Knee Exercises B side stepping & fwd/back monster walk with green looped TB at ankles 2 x 20 ft       Knee/Hip Exercises: Supine   Bridges Both;10 reps;Right;Left;Strengthening    Bridges Limitations + 1 LAQ each LE     Bridges with Clamshell Both;10 reps;Strengthening   Sustained bridge + alteranting clam shell with red TB   Other Supine Knee/Hip Exercises Hooklying alternating Dead Bug x 10 reps       Knee/Hip Exercises: Sidelying   Clams B clam shell in R sidelying with red TB x 12 reps       Knee/Hip Exercises: Prone   Other Prone Exercises Quadruped sustained "Bird Dog row" with 5# dumbbell row x 10 reps                   PT Education - 10/17/19 1628    Education Details HEP update: green TB issued to pt. for monster walk and side stepping    Person(s) Educated Patient    Methods Explanation;Verbal cues;Handout    Comprehension Verbalized understanding;Verbal cues required            PT Short Term Goals - 08/19/19 1754      PT SHORT TERM GOAL #1   Title Patient will be independent with initial HEP    Status Achieved   08/15/19            PT Long Term Goals - 09/19/19 1629      PT LONG TERM GOAL #1   Title Patient will be independent with ongoing/advanced HEP    Status Partially Met    Target Date 10/31/19      PT LONG TERM GOAL #2   Title Patient to demonstrate improved tissue quality and pliability with reduced pain    Status Partially Met    Target Date 10/31/19      PT LONG TERM GOAL #3   Title Patient will demonstrate improved B proximal LE strength to >/= 4 to 4+/5 for improved stability and ease of mobility    Status Partially Met    Target Date 10/31/19      PT LONG TERM GOAL #4   Title Patient will improve walking tolerance to >/= 3-4 miles w/o pain interference to allow resumption of normal daily activities    Status Partially Met   09/19/19 - able to walk 2-3 miles with "annoying soreness" but  tolerable   Target Date 10/31/19      PT LONG TERM GOAL #5   Title Patient will negotiate stairs reciprocally with normal step pattern w/o limitation due to L hip pain or weakness    Status Partially Met    Target Date 10/31/19      PT LONG TERM GOAL #6   Title Patient will be able to resume light jogging/running up to 2 miles w/o L hip pain    Status On-going    Target Date 10/31/19                 Plan - 10/17/19 1535    Clinical Impression Statement Rhonda [name] noting, "I think I may be turning a corner".  Had 5k race on Saturday with 3-4/10 pain while running and a few days of L LE soreness afterward which she feels she has recovered from.  Noting 90% improvement in walking pain since starting therapy. Pt. noting 70% improvement in running/jogging pain since starting therapy.  Notes 75% improvement in L hip pain while climbing  stairs.  Progressed strengthening activities with continued focus on lateral glutes which was tolerated well.  Deferred MT as pt. denying tenderness in L LE.  Did require intermittent cueing for slower pacing with a few activities with good carryover.  Issued green looped TB to pt. for clam shell and monster walk, side stepping as pt. noting these activities have become easy at home.    Comorbidities h/o L knee scope x 2 for meniscal debridement and cyst removal, Tibial tendinitis, Patellar tendinitis, R Achilles tendinitis, Injury of plantaris muscle or tendon, Tibial stress fracture, Pelvic stress fracture, Metatarsal stress fracture, Hammer toe, L trochanteric bursitis, L RCR, Asthma (mild intermittent asthma without complication), GERD, BPPV, Sensorineural hearing loss, Breast cancer, Basal cell carcinoma    Rehab Potential Good    PT Frequency 2x / week    PT Treatment/Interventions ADLs/Self Care Home Management;Cryotherapy;Electrical Stimulation;Moist Heat;Ultrasound;Gait training;Stair training;Functional mobility training;Therapeutic activities;Therapeutic  exercise;Balance training;Neuromuscular re-education;Patient/family education;Manual techniques;Passive range of motion;Dry needling;Taping;Joint Manipulations;Iontophoresis '4mg'$ /ml Dexamethasone    PT Next Visit Plan progress proximal LE strengthening including glute minumus and medius, L hip flexibilty/stretching, manual STM/MFR, modalities PRN    PT Home Exercise Plan 08/12/19 - standing & supine ITB stretches,  kneel hip flexor stretch, standing and sidelying glute med strengthening, yellow TB psoas march; 08/23/19 - seated yellow TB hip ABD; 08/26/19 - standing yellow TB 3-way hip SLR, squat + yellow TB hip abduction isometric; 09/04/19 - glute ball massage with ball on wall; 09/09/19 - quadruped hip extension, ext/abd & fire hydrants    Consulted and Agree with Plan of Care Patient           Patient will benefit from skilled therapeutic intervention in order to improve the following deficits and impairments:  Abnormal gait, Decreased activity tolerance, Decreased knowledge of precautions, Decreased mobility, Decreased strength, Difficulty walking, Increased edema, Increased fascial restricitons, Increased muscle spasms, Impaired perceived functional ability, Pain  Visit Diagnosis: Pain in left hip  Difficulty in walking, not elsewhere classified  Muscle weakness (generalized)  Other muscle spasm  Other symptoms and signs involving the musculoskeletal system     Problem List Patient Active Problem List   Diagnosis Date Noted  . GERD (gastroesophageal reflux disease) 05/07/2019  . Wrist pain, acute, left 04/23/2019  . Knee pain, left 06/15/2017  . Rotator cuff tendinitis, left 09/08/2016  . Right foot pain 04/06/2016  . Injury of plantaris muscle or tendon 10/30/2014  . Achilles tendinitis of right lower extremity 06/19/2014  . Thrombocytopenia ([place]) 01/30/2014  . Benign paroxysmal positional vertigo 11/13/2013  . Insomnia 11/13/2013  . Sensorineural hearing loss 11/13/2013  .  Tinnitus 11/13/2013  . Hyperlipidemia LDL goal <130 10/24/2013  . Mild intermittent asthma without complication 26/41/5830  . Right knee pain 04/23/2013  . Patellar tendinitis 03/07/2013  . Muscle tear 11/15/2012  . Metatarsal stress fracture 08/29/2012  . Numbness and tingling of foot 03/29/2012  . Ankle pain 08/17/2011  . Chest pain in adult 08/17/2011  . TROCHANTERIC BURSITIS, LEFT 04/27/2010  . LEG PAIN, RIGHT 09/22/2009  . TIBIALIS TENDINITIS 08/12/2009  . STRESS FRACTURE, TIBIA 06/15/2009  . UNEQUAL LEG LENGTH 09/18/2008  . CARCINOMA, BASAL CELL 02/07/2008  . Malignant neoplasm of female breast ([place]) 02/07/2008  . STRESS FRACTURE OF PELVIS 08/29/2007  . HAMMER TOE, OTHER, ACQUIRED 10/13/2006  . ALLERGIC RHINITIS 10/12/2006  . Asthma 10/12/2006  . Allergic rhinitis 10/12/2006    Bess Harvest, PTA 10/17/19 4:29 PM   [facility] [address]  [place], [place], [address] Phone: [phone]   Fax:  [phone]  Name: Rhonda [name] MRN: [id number] Date of Birth: June 18, 1950

## 2019-10-22 ENCOUNTER — Other Ambulatory Visit: Payer: Self-pay

## 2019-10-22 ENCOUNTER — Ambulatory Visit: Payer: 59

## 2019-10-22 DIAGNOSIS — R262 Difficulty in walking, not elsewhere classified: Secondary | ICD-10-CM

## 2019-10-22 DIAGNOSIS — M6281 Muscle weakness (generalized): Secondary | ICD-10-CM | POA: Diagnosis not present

## 2019-10-22 DIAGNOSIS — M62838 Other muscle spasm: Secondary | ICD-10-CM | POA: Diagnosis not present

## 2019-10-22 DIAGNOSIS — R29898 Other symptoms and signs involving the musculoskeletal system: Secondary | ICD-10-CM | POA: Diagnosis not present

## 2019-10-22 DIAGNOSIS — M25552 Pain in left hip: Secondary | ICD-10-CM | POA: Diagnosis not present

## 2019-10-22 NOTE — Therapy (Signed)
[facility] [place] [address]  [address] [place], [place], [address] Phone: [phone]   Fax:  [phone]  Physical Therapy Treatment  Patient Details  Rhonda [name]: Rhonda [name] MRN: [id number] Rhonda [name]: 04/12/1951 Referring Provider (PT): Shane Hudnall, MD   Encounter Rhonda: 10/22/2019   PT End of Session - 10/22/19 1618    Visit Number 20    Number of Visits 24    Rhonda for PT Re-Evaluation 10/31/19    Authorization Type [facility]    Progress Note Due on Visit 22    PT Start Time 1618    PT Stop Time 1718    PT Time Calculation (min) 60 min    Activity Tolerance Patient tolerated treatment well    Behavior During Therapy WFL for tasks assessed/performed           Past Medical History:  Diagnosis Rhonda  . Achilles tendinitis of right lower extremity 06/19/2014   US showed minor changes in December 15  Most sxs seem to be enthesopathy  Note had reaction to NTG in past  Overview:  Overview:  US showed minor changes in December 15  Most sxs seem to be enthesopathy  Note had reaction to NTG in past  Last Assessment & Plan:  Recent irritation in the race appears to be a partial plantaris tear  This continues to give her some Achilles symptoms  I think a period  . ALLERGIC RHINITIS 10/12/2006   Qualifier: Diagnosis of  By: McDiarmid MD, Todd    . Ankle pain 08/17/2011  . Asthma 10/12/2006   Qualifier: Diagnosis of  By: McDiarmid MD, Todd    Overview:  Overview:  Qualifier: Diagnosis of  By: McDiarmid MD, Todd   . CARCINOMA, BASAL CELL 02/07/2008   Qualifier: History of  By: Bolden  MD, Taineisha    Overview:  Overview:  Qualifier: History of  By: Bolden  MD, Taineisha   Overview:  Qualifier: History of  By: Bolden  MD, Taineisha   . Chest pain in adult 08/17/2011   This has worrisome features particularly with her level of exertion   . HAMMER TOE, OTHER, ACQUIRED 10/13/2006   Qualifier: Diagnosis of  By: McDiarmid MD, Todd    Overview:   Overview:  Qualifier: Diagnosis of  By: McDiarmid MD, Todd   . High cholesterol   . Hyperlipidemia LDL goal <130 10/24/2013  . Injury of plantaris muscle or tendon 10/30/2014  . Insomnia 11/13/2013  . Knee pain, left 06/15/2017  . LEG PAIN, RIGHT 09/22/2009   Qualifier: Diagnosis of  By: FIELDS MD, KARL    . Malignant neoplasm of female breast (HCC) 02/07/2008   Qualifier: History of  By: Bolden  MD, Taineisha    Overview:  Overview:  Qualifier: History of  By: Bolden  MD, Taineisha  Overview:  Overview:  Qualifier: History of  By: Bolden  MD, Taineisha   . Metatarsal stress fracture 08/29/2012   This is at the distal shaft of the fifth metatarsal left foot   . Mild intermittent asthma without complication 10/24/2013  . Muscle tear 11/15/2012   Left rectus femoris; also like tendon tear   . Numbness and tingling of foot 03/29/2012  . Patellar tendinitis 03/07/2013   I suspect this occurred because she did less crosstraining before this marathon and was probably hamstring dominant   . Right foot pain 04/06/2016  . Right knee pain 04/23/2013   04/23/13  ultrasound today RT knee    revealed no significant effusion The quadriceps tendon is intact as is the patellar tendon The medial meniscus was normal The lateral meniscus had an area of calcification and a very small area of splitting along the midline to the posterior third of the joint line   . Rotator cuff tendinitis, left 09/08/2016   Overview:  Last Assessment & Plan:  Significant improvement  See OV summary  . Sensorineural hearing loss 11/13/2013  . Stress fracture of pelvis 08/29/2007   Qualifier: History of  By: Drue Flirt  MD, Merrily Brittle    . STRESS FRACTURE, TIBIA 06/15/2009   Qualifier: Diagnosis of  By: Drue Flirt  MD, Merrily Brittle    . Thrombocytopenia ([place]) 01/30/2014  . Tibialis tendinitis 08/12/2009   Qualifier: Diagnosis of  By: Oneida Alar MD, KARL    . Tinnitus 11/13/2013  . TROCHANTERIC BURSITIS, LEFT 04/27/2010   Qualifier: Diagnosis of  By: Ernestina Patches MD,  Remo Lipps    . UNEQUAL LEG LENGTH 09/18/2008   Qualifier: Diagnosis of  By: Oneida Alar MD, KARL      Past Surgical History:  Procedure Laterality Rhonda  . ABDOMINAL SURGERY    . ABDOMINAL SURGERY     for removal of abdominal mass-cancerous  . KNEE SURGERY    . MANDIBLE FRACTURE SURGERY    . NASAL SINUS SURGERY    . TONSILLECTOMY      There were no vitals filed for this visit.   Subjective Assessment - 10/22/19 1619    Subjective Pt reports having a meeting all day and felt the stiffness in her hip because of sitting. She "tried to run Saturday", but her hip didn't like the impact with a 2/10 pain but stopped at 1.5 miles of running and walking at 2.5 miles. She was sore the next day.    Patient Stated Goals "get back to running w/o pain"    Currently in Pain? No/denies    Pain Onset More than a month ago                             [facility] Adult PT Treatment/Exercise - 10/22/19 0001      Knee/Hip Exercises: Stretches   Hip Flexor Stretch Left;2 reps;30 seconds    Hip Flexor Stretch Limitations focused on posterior pelvic tilt to target higher hip flexors with less lunge      Knee/Hip Exercises: Aerobic   Tread Mill Running analysis   L hip anterior rotation/hike with mild L medial heel whip   Nustep L5 x 6 min (UE/LE)       Knee/Hip Exercises: Standing   Abduction Limitations CKC on step, reported inferolateral L glute pain "like my hip is going to give out" - discont'd exercise due to pt inability to hold her weight on LLE    Lateral Step Up Right;Left;1 set;5 reps;Hand Hold: 1    Lateral Step Up Limitations unable to maintain L hip CKC and pain, discont'd    SLS with opposite hip ext, able to fire R glute and subsequently cramped x10"    Other Standing Knee Exercises B side stepping & fwd/back monster walk with green looped TB at ankles 2 x 20 ft       Knee/Hip Exercises: Prone   Hip Extension AROM;Strengthening;Right;Left;1 set;5 reps    Hip Extension  Limitations knee bent, HS cramping with any thigh lift off mat and inability to maintain neutral hip ext: therefore, added manual downward pressure to heel and cued to draw navel to  spine, push hip down/PPT, squeeze glute and very gently press up into PT applied R      Manual Therapy   Manual Therapy Soft tissue mobilization    Soft tissue mobilization STM/XFM L TFL                    PT Short Term Goals - 08/19/19 1754      PT SHORT TERM GOAL #1   Title Patient will be independent with initial HEP    Status Achieved   08/15/19            PT Long Term Goals - 09/19/19 1629      PT LONG TERM GOAL #1   Title Patient will be independent with ongoing/advanced HEP    Status Partially Met    Target Rhonda 10/31/19      PT LONG TERM GOAL #2   Title Patient to demonstrate improved tissue quality and pliability with reduced pain    Status Partially Met    Target Rhonda 10/31/19      PT LONG TERM GOAL #3   Title Patient will demonstrate improved B proximal LE strength to >/= 4 to 4+/5 for improved stability and ease of mobility    Status Partially Met    Target Rhonda 10/31/19      PT LONG TERM GOAL #4   Title Patient will improve walking tolerance to >/= 3-4 miles w/o pain interference to allow resumption of normal daily activities    Status Partially Met   09/19/19 - able to walk 2-3 miles with "annoying soreness" but tolerable   Target Rhonda 10/31/19      PT LONG TERM GOAL #5   Title Patient will negotiate stairs reciprocally with normal step pattern w/o limitation due to L hip pain or weakness    Status Partially Met    Target Rhonda 10/31/19      PT LONG TERM GOAL #6   Title Patient will be able to resume light jogging/running up to 2 miles w/o L hip pain    Status On-going    Target Rhonda 10/31/19                 Plan - 10/22/19 1618    Clinical Impression Statement Rhonda [name] presents with continued low grade stiffness and irritability of L hip. She expressed  frustration that she still feels pain in her L TFL/hip joint every time she lands while running. She only ran once since last visit for 1.5 miles and has cut back. Pt is overdoing exercises and advised to back off and focus on "finding glutes". Provided handout for knee bent prone hip extension modified to focus on at home to fire B glutes. Pt is quad dominant and HS dominant with very weak glutes (3-/5 glute max in prone, 3+/5 hip abd in S/L) and is unable to support the weight of her body in her L hip with dynamic strengthening. Discontinued standing CKC hip abduction for now and any hip hiking. Pt advised to perform static strengthening first before attempting any dynamic movements, secondary to L hip weakness.    Comorbidities h/o L knee scope x 2 for meniscal debridement and cyst removal, Tibial tendinitis, Patellar tendinitis, R Achilles tendinitis, Injury of plantaris muscle or tendon, Tibial stress fracture, Pelvic stress fracture, Metatarsal stress fracture, Hammer toe, L trochanteric bursitis, L RCR, Asthma (mild intermittent asthma without complication), GERD, BPPV, Sensorineural hearing loss, Breast cancer, Basal cell carcinoma    Rehab Potential Good      PT Frequency 2x / week    PT Treatment/Interventions ADLs/Self Care Home Management;Cryotherapy;Electrical Stimulation;Moist Heat;Ultrasound;Gait training;Stair training;Functional mobility training;Therapeutic activities;Therapeutic exercise;Balance training;Neuromuscular re-education;Patient/family education;Manual techniques;Passive range of motion;Dry needling;Taping;Joint Manipulations;Iontophoresis 50m/ml Dexamethasone    PT Next Visit Plan progress proximal LE strengthening including glute minumus and medius, L hip flexibilty/stretching, manual STM/MFR, modalities PRN    PT Home Exercise Plan 08/12/19 - standing & supine ITB stretches,  kneel hip flexor stretch, standing and sidelying glute med strengthening, yellow TB psoas march; 08/23/19 -  seated yellow TB hip ABD; 08/26/19 - standing yellow TB 3-way hip SLR, squat + yellow TB hip abduction isometric; 09/04/19 - glute ball massage with ball on wall; 09/09/19 - quadruped hip extension, ext/abd & fire hydrants    Consulted and Agree with Plan of Care Patient           Patient will benefit from skilled therapeutic intervention in order to improve the following deficits and impairments:  Abnormal gait, Decreased activity tolerance, Decreased knowledge of precautions, Decreased mobility, Decreased strength, Difficulty walking, Increased edema, Increased fascial restricitons, Increased muscle spasms, Impaired perceived functional ability, Pain  Visit Diagnosis: Pain in left hip  Difficulty in walking, not elsewhere classified  Muscle weakness (generalized)  Other muscle spasm  Other symptoms and signs involving the musculoskeletal system     Problem List Patient Active Problem List   Diagnosis Rhonda Noted  . GERD (gastroesophageal reflux disease) 05/07/2019  . Wrist pain, acute, left 04/23/2019  . Knee pain, left 06/15/2017  . Rotator cuff tendinitis, left 09/08/2016  . Right foot pain 04/06/2016  . Injury of plantaris muscle or tendon 10/30/2014  . Achilles tendinitis of right lower extremity 06/19/2014  . Thrombocytopenia ([place] 01/30/2014  . Benign paroxysmal positional vertigo 11/13/2013  . Insomnia 11/13/2013  . Sensorineural hearing loss 11/13/2013  . Tinnitus 11/13/2013  . Hyperlipidemia LDL goal <130 10/24/2013  . Mild intermittent asthma without complication [id number] . Right knee pain 04/23/2013  . Patellar tendinitis 03/07/2013  . Muscle tear 11/15/2012  . Metatarsal stress fracture 08/29/2012  . Numbness and tingling of foot 03/29/2012  . Ankle pain 08/17/2011  . Chest pain in adult 08/17/2011  . TROCHANTERIC BURSITIS, LEFT 04/27/2010  . LEG PAIN, RIGHT 09/22/2009  . TIBIALIS TENDINITIS 08/12/2009  . STRESS FRACTURE, TIBIA 06/15/2009  . UNEQUAL LEG  LENGTH 09/18/2008  . CARCINOMA, BASAL CELL 02/07/2008  . Mal[place] 02/07/2008  . STRESS FRACTURE OF PELVIS 08/29/2007  . HAMMER TOE, OTHER, ACQUIRED 10/13/2006  . ALLERGIC RHINITIS 10/12/2006  . Asthma 10/12/2006  . Allergic rhinitis 10/12/2006    Rhonda [name] PT, DPT 10/22/2019, 5:40 PM  [facility] [place] [address]Phone: [phone]  Fax:  [phone] Rhonda [name]: Rhonda CASHATTMRN: [id number] of [name]: 302-Jun-1952

## 2019-10-24 ENCOUNTER — Ambulatory Visit: Payer: 59 | Admitting: Physical Therapy

## 2019-10-24 ENCOUNTER — Other Ambulatory Visit: Payer: Self-pay

## 2019-10-24 ENCOUNTER — Encounter: Payer: Self-pay | Admitting: Physical Therapy

## 2019-10-24 DIAGNOSIS — M62838 Other muscle spasm: Secondary | ICD-10-CM

## 2019-10-24 DIAGNOSIS — M25552 Pain in left hip: Secondary | ICD-10-CM

## 2019-10-24 DIAGNOSIS — M6281 Muscle weakness (generalized): Secondary | ICD-10-CM | POA: Diagnosis not present

## 2019-10-24 DIAGNOSIS — R262 Difficulty in walking, not elsewhere classified: Secondary | ICD-10-CM | POA: Diagnosis not present

## 2019-10-24 DIAGNOSIS — R29898 Other symptoms and signs involving the musculoskeletal system: Secondary | ICD-10-CM | POA: Diagnosis not present

## 2019-10-24 NOTE — Therapy (Signed)
[facility] [address]  [place], [place], [address] Phone: [phone]   Fax:  [phone]  Physical Therapy Treatment  Patient Details  Name: Rhonda [name] MRN: [id number] Date of Birth: October 10, 1950 Referring Provider (PT): Karlton Lemon, MD   Encounter Date: 10/24/2019   PT End of Session - 10/24/19 1620    Visit Number 21    Number of Visits 24    Date for PT Re-Evaluation 10/31/19    Authorization Type [facility]    Progress Note Due on Visit 22    PT Start Time 1620    PT Stop Time 1700    PT Time Calculation (min) 40 min    Activity Tolerance Patient tolerated treatment well    Behavior During Therapy [facility] for tasks assessed/performed           Past Medical History:  Diagnosis Date  . Achilles tendinitis of right lower extremity 06/19/2014   US showed minor changes in December 15  Most sxs seem to be enthesopathy  Note had reaction to NTG in past  Overview:  Overview:  US showed minor changes in December 15  Most sxs seem to be enthesopathy  Note had reaction to NTG in past  Last Assessment & Plan:  Recent irritation in the race appears to be a partial plantaris tear  This continues to give her some Achilles symptoms  I think a period  . ALLERGIC RHINITIS 10/12/2006   Qualifier: Diagnosis of  By: McDiarmid MD, Sherren Mocha    . Ankle pain 08/17/2011  . Asthma 10/12/2006   Qualifier: Diagnosis of  By: McDiarmid MD, Sherren Mocha    Overview:  Overview:  Qualifier: Diagnosis of  By: McDiarmid MD, Sherren Mocha   . CARCINOMA, BASAL CELL 02/07/2008   Qualifier: History of  By: Drue Flirt  MD, Merrily Brittle    Overview:  Overview:  Qualifier: History of  By: Drue Flirt  MD, Merrily Brittle   Overview:  Qualifier: History of  By: Drue Flirt  MD, Merrily Brittle   . Chest pain in adult 08/17/2011   This has worrisome features particularly with her level of exertion   . HAMMER TOE, OTHER, ACQUIRED 10/13/2006   Qualifier: Diagnosis of  By: McDiarmid MD, Sherren Mocha    Overview:   Overview:  Qualifier: Diagnosis of  By: McDiarmid MD, Sherren Mocha   . High cholesterol   . Hyperlipidemia LDL goal <130 10/24/2013  . Injury of plantaris muscle or tendon 10/30/2014  . Insomnia 11/13/2013  . Knee pain, left 06/15/2017  . LEG PAIN, RIGHT 09/22/2009   Qualifier: Diagnosis of  By: Oneida Alar MD, KARL    . Malignant neoplasm of female breast ([place]) 02/07/2008   Qualifier: History of  By: Drue Flirt  MD, Merrily Brittle    Overview:  Overview:  Qualifier: History of  By: Drue Flirt  MD, Merrily Brittle  Overview:  Overview:  Qualifier: History of  By: Drue Flirt  MD, Merrily Brittle   . Metatarsal stress fracture 08/29/2012   This is at the distal shaft of the fifth metatarsal left foot   . Mild intermittent asthma without complication 7/53/0051  . Muscle tear 11/15/2012   Left rectus femoris; also like tendon tear   . Numbness and tingling of foot 03/29/2012  . Patellar tendinitis 03/07/2013   I suspect this occurred because she did less crosstraining before this marathon and was probably hamstring dominant   . Right foot pain 04/06/2016  . Right knee pain 04/23/2013   04/23/13  ultrasound today RT knee  revealed no significant effusion The quadriceps tendon is intact as is the patellar tendon The medial meniscus was normal The lateral meniscus had an area of calcification and a very small area of splitting along the midline to the posterior third of the joint line   . Rotator cuff tendinitis, left 09/08/2016   Overview:  Last Assessment & Plan:  Significant improvement  See OV summary  . Sensorineural hearing loss 11/13/2013  . Stress fracture of pelvis 08/29/2007   Qualifier: History of  By: Drue Flirt  MD, Merrily Brittle    . STRESS FRACTURE, TIBIA 06/15/2009   Qualifier: Diagnosis of  By: Drue Flirt  MD, Merrily Brittle    . Thrombocytopenia ([place]) 01/30/2014  . Tibialis tendinitis 08/12/2009   Qualifier: Diagnosis of  By: Oneida Alar MD, KARL    . Tinnitus 11/13/2013  . TROCHANTERIC BURSITIS, LEFT 04/27/2010   Qualifier: Diagnosis of  By: Ernestina Patches MD,  Remo Lipps    . UNEQUAL LEG LENGTH 09/18/2008   Qualifier: Diagnosis of  By: Oneida Alar MD, KARL      Past Surgical History:  Procedure Laterality Date  . ABDOMINAL SURGERY    . ABDOMINAL SURGERY     for removal of abdominal mass-cancerous  . KNEE SURGERY    . MANDIBLE FRACTURE SURGERY    . NASAL SINUS SURGERY    . TONSILLECTOMY      There were no vitals filed for this visit.   Subjective Assessment - 10/24/19 1623    Subjective Pt reports she has not been running recently - has been very busy with work and has been frustrated/not motivated. Work demands have caused her to have to sit more than usual. Also has had to reduce her exercise frequency due to work demands. Requesting review of recent exercises today.    Patient Stated Goals "get back to running w/o pain"    Currently in Pain? Yes    Pain Score 1     Pain Location Buttocks    Pain Orientation Left;Posterior    Pain Descriptors / Indicators Sore    Pain Type Acute pain    Pain Frequency Intermittent                             [facility] Adult PT Treatment/Exercise - 10/24/19 1620      Knee/Hip Exercises: Aerobic   Nustep L5 x 6 min (UE/LE)       Knee/Hip Exercises: Standing   Hip Abduction Right;Left;10 reps;Stengthening;Knee bent    Abduction Limitations Fitter (1 black) - increased effort required on L; 2 pole A     Hip Extension Right;Left;15 reps;Stengthening;Knee bent    Extension Limitations Fitter (1 black); 2 pole A    SLS with Vectors R/L on 2" step - hip hike to neutral focusing on level pelvis + 3-way hip kicker x 10; 2 pole assist    Walking with Sports Cord Resisted gait - fwd 2 x 90 ft, R/L 2 x 30 ft      Knee/Hip Exercises: Supine   Single Leg Bridge Left   unable w/o HS cramping   Other Supine Knee/Hip Exercises B straight leg bridge (ankles on peanut ball) 10 x 3" - cues for abd bracing & post pelvic tilt as well as to avoid digging heels into peanut ball to avoid HS activation    Other  Supine Knee/Hip Exercises Straight leg bridge (ankles on peanut ball) + alt SLR 5 x 3" - cues for abd bracing &  post pelvic tilt as well as to avoid digging heels into peanut ball to avoid HS activation      Knee/Hip Exercises: Prone   Hip Extension AROM;Strengthening;Right;Left;1 set;5 reps                    PT Short Term Goals - 08/19/19 1754      PT SHORT TERM GOAL #1   Title Patient will be independent with initial HEP    Status Achieved   08/15/19            PT Long Term Goals - 09/19/19 1629      PT LONG TERM GOAL #1   Title Patient will be independent with ongoing/advanced HEP    Status Partially Met    Target Date 10/31/19      PT LONG TERM GOAL #2   Title Patient to demonstrate improved tissue quality and pliability with reduced pain    Status Partially Met    Target Date 10/31/19      PT LONG TERM GOAL #3   Title Patient will demonstrate improved B proximal LE strength to >/= 4 to 4+/5 for improved stability and ease of mobility    Status Partially Met    Target Date 10/31/19      PT LONG TERM GOAL #4   Title Patient will improve walking tolerance to >/= 3-4 miles w/o pain interference to allow resumption of normal daily activities    Status Partially Met   09/19/19 - able to walk 2-3 miles with "annoying soreness" but tolerable   Target Date 10/31/19      PT LONG TERM GOAL #5   Title Patient will negotiate stairs reciprocally with normal step pattern w/o limitation due to L hip pain or weakness    Status Partially Met    Target Date 10/31/19      PT LONG TERM GOAL #6   Title Patient will be able to resume light jogging/running up to 2 miles w/o L hip pain    Status On-going    Target Date 10/31/19                 Plan - 10/24/19 1700    Clinical Impression Statement [name] continues to express frustration with ongoing pain. She has cut back slightly on running and HEP due to increased work demands, however same work demands have caused to  have to sit for extended periods of time. She continues to demonstrate HS dominance with attempts at glute activation leading to ongoing c/o of HS "cramping" and requires frequent cues/redirection to better isolate glute activation including review of most recent HEP update for prone bent knee hip extension. Better tolerance noted for standing [facility] glute activation with pt able to maintain level pelvis in SLS with unresisted 3-way hip motion of opposite leg but cautioned pt to avoid attempting to replicate all of today's therapy session exercises, focusing on exercises recommended last session.    Personal Factors and Comorbidities Comorbidity 3+;Time since onset of injury/illness/exacerbation;Past/Current Experience;Age;Profession;Fitness    Comorbidities h/o L knee scope x 2 for meniscal debridement and cyst removal, Tibial tendinitis, Patellar tendinitis, R Achilles tendinitis, Injury of plantaris muscle or tendon, Tibial stress fracture, Pelvic stress fracture, Metatarsal stress fracture, Hammer toe, L trochanteric bursitis, L RCR, Asthma (mild intermittent asthma without complication), GERD, BPPV, Sensorineural hearing loss, Breast cancer, Basal cell carcinoma    Examination-Activity Limitations Locomotion Level;Sleep;Stairs;Transfers    Examination-Participation Restrictions Community Activity;Other    Rehab Potential  Good    PT Frequency 2x / week    PT Duration 6 weeks    PT Treatment/Interventions ADLs/Self Care Home Management;Cryotherapy;Electrical Stimulation;Moist Heat;Ultrasound;Gait training;Stair training;Functional mobility training;Therapeutic activities;Therapeutic exercise;Balance training;Neuromuscular re-education;Patient/family education;Manual techniques;Passive range of motion;Dry needling;Taping;Joint Manipulations;Iontophoresis 26m/ml Dexamethasone    PT Next Visit Plan progress proximal LE strengthening including glute minumus and medius, L hip flexibilty/stretching, manual  STM/MFR, modalities PRN    PT Home Exercise Plan 08/12/19 - standing & supine ITB stretches,  kneel hip flexor stretch, standing and sidelying glute med strengthening, yellow TB psoas march; 08/23/19 - seated yellow TB hip ABD; 08/26/19 - standing yellow TB 3-way hip SLR, squat + yellow TB hip abduction isometric; 09/04/19 - glute ball massage with ball on wall; 09/09/19 - quadruped hip extension, ext/abd & fire hydrants    Consulted and Agree with Plan of Care Patient           Patient will benefit from skilled therapeutic intervention in order to improve the following deficits and impairments:  Abnormal gait, Decreased activity tolerance, Decreased knowledge of precautions, Decreased mobility, Decreased strength, Difficulty walking, Increased edema, Increased fascial restricitons, Increased muscle spasms, Impaired perceived functional ability, Pain  Visit Diagnosis: Pain in left hip  Difficulty in walking, not elsewhere classified  Muscle weakness (generalized)  Other muscle spasm  Other symptoms and signs involving the musculoskeletal system     Problem List Patient Active Problem List   Diagnosis Date Noted  . GERD (gastroesophageal reflux disease) 05/07/2019  . Wrist pain, acute, left 04/23/2019  . Knee pain, left 06/15/2017  . Rotator cuff tendinitis, left 09/08/2016  . Right foot pain 04/06/2016  . Injury of plantaris muscle or tendon 10/30/2014  . Achilles tendinitis of right lower extremity 06/19/2014  . Thrombocytopenia ([place] 01/30/2014  . Benign paroxysmal positional vertigo 11/13/2013  . Insomnia 11/13/2013  . Sensorineural hearing loss 11/13/2013  . Tinnitus 11/13/2013  . Hyperlipidemia LDL goal <130 10/24/2013  . Mild intermittent asthma without complication 063/87/5643 . Right knee pain 04/23/2013  . Patellar tendinitis 03/07/2013  . Muscle tear 11/15/2012  . Metatarsal stress fracture 08/29/2012  . Numbness and tingling of foot 03/29/2012  . Ankle pain  08/17/2011  . Chest pain in adult 08/17/2011  . TROCHANTERIC BURSITIS, LEFT 04/27/2010  . LEG PAIN, RIGHT 09/22/2009  . TIBIALIS TENDINITIS 08/12/2009  . STRESS FRACTURE, TIBIA 06/15/2009  . UNEQUAL LEG LENGTH 09/18/2008  . CARCINOMA, BASAL CELL 02/07/2008  . Malignant ne[place] of female breast ([place] 02/07/2008  . STRESS FRACTURE OF PELVIS 08/29/2007  . HAMMER TOE, OTHER, ACQUIRED 10/13/2006  . ALLERGIC RHINITIS 10/12/2006  . Asthma 10/12/2006  . Allergic rhinitis 10/12/2006    JPercival Spanish PT, MPT 10/24/2019, 7:36 PM  [facility] [place] [address]Phone: [phone]  Fax:  [phone] Name: KPAMMIE CHIRINOMRN: [id number] of Birth: [birth year]

## 2019-10-28 ENCOUNTER — Other Ambulatory Visit: Payer: Self-pay

## 2019-10-28 ENCOUNTER — Ambulatory Visit: Payer: 59 | Admitting: Physical Therapy

## 2019-10-28 ENCOUNTER — Encounter: Payer: Self-pay | Admitting: Physical Therapy

## 2019-10-28 DIAGNOSIS — M6281 Muscle weakness (generalized): Secondary | ICD-10-CM | POA: Diagnosis not present

## 2019-10-28 DIAGNOSIS — M25552 Pain in left hip: Secondary | ICD-10-CM | POA: Diagnosis not present

## 2019-10-28 DIAGNOSIS — M62838 Other muscle spasm: Secondary | ICD-10-CM

## 2019-10-28 DIAGNOSIS — R262 Difficulty in walking, not elsewhere classified: Secondary | ICD-10-CM

## 2019-10-28 DIAGNOSIS — R29898 Other symptoms and signs involving the musculoskeletal system: Secondary | ICD-10-CM

## 2019-10-28 NOTE — Therapy (Signed)
[facility] [place] [address]  [place], [place], [address] Phone: [phone]   Fax:  [phone]  Physical Therapy Treatment  Patient Details  Name: Rhonda [name] MRN: [id number] Date of Birth: February 23, 1951 Referring Provider (PT): Karlton Lemon, MD   Encounter Date: 10/28/2019   PT End of Session - 10/28/19 1622    Visit Number 22    Number of Visits 24    Date for PT Re-Evaluation 10/31/19    Authorization Type [facility]    Progress Note Due on Visit 22    PT Start Time 1622    PT Stop Time 1712    PT Time Calculation (min) 50 min    Activity Tolerance Patient tolerated treatment well    Behavior During Therapy [facility] for tasks assessed/performed           Past Medical History:  Diagnosis Date  . Achilles tendinitis of right lower extremity 06/19/2014   US showed minor changes in December 15  Most sxs seem to be enthesopathy  Note had reaction to NTG in past  Overview:  Overview:  US showed minor changes in December 15  Most sxs seem to be enthesopathy  Note had reaction to NTG in past  Last Assessment & Plan:  Recent irritation in the race appears to be a partial plantaris tear  This continues to give her some Achilles symptoms  I think a period  . ALLERGIC RHINITIS 10/12/2006   Qualifier: Diagnosis of  By: McDiarmid MD, Sherren Mocha    . Ankle pain 08/17/2011  . Asthma 10/12/2006   Qualifier: Diagnosis of  By: McDiarmid MD, Sherren Mocha    Overview:  Overview:  Qualifier: Diagnosis of  By: McDiarmid MD, Sherren Mocha   . CARCINOMA, BASAL CELL 02/07/2008   Qualifier: History of  By: Drue Flirt  MD, Merrily Brittle    Overview:  Overview:  Qualifier: History of  By: Drue Flirt  MD, Merrily Brittle   Overview:  Qualifier: History of  By: Drue Flirt  MD, Merrily Brittle   . Chest pain in adult 08/17/2011   This has worrisome features particularly with her level of exertion   . HAMMER TOE, OTHER, ACQUIRED 10/13/2006   Qualifier: Diagnosis of  By: McDiarmid MD, Sherren Mocha    Overview:   Overview:  Qualifier: Diagnosis of  By: McDiarmid MD, Sherren Mocha   . High cholesterol   . Hyperlipidemia LDL goal <130 10/24/2013  . Injury of plantaris muscle or tendon 10/30/2014  . Insomnia 11/13/2013  . Knee pain, left 06/15/2017  . LEG PAIN, RIGHT 09/22/2009   Qualifier: Diagnosis of  By: Oneida Alar MD, KARL    . Malignant neoplasm of female breast ([place]) 02/07/2008   Qualifier: History of  By: Drue Flirt  MD, Merrily Brittle    Overview:  Overview:  Qualifier: History of  By: Drue Flirt  MD, Merrily Brittle  Overview:  Overview:  Qualifier: History of  By: Drue Flirt  MD, Merrily Brittle   . Metatarsal stress fracture 08/29/2012   This is at the distal shaft of the fifth metatarsal left foot   . Mild intermittent asthma without complication 2/99/2426  . Muscle tear 11/15/2012   Left rectus femoris; also like tendon tear   . Numbness and tingling of foot 03/29/2012  . Patellar tendinitis 03/07/2013   I suspect this occurred because she did less crosstraining before this marathon and was probably hamstring dominant   . Right foot pain 04/06/2016  . Right knee pain 04/23/2013   04/23/13  ultrasound today RT knee  revealed no significant effusion The quadriceps tendon is intact as is the patellar tendon The medial meniscus was normal The lateral meniscus had an area of calcification and a very small area of splitting along the midline to the posterior third of the joint line   . Rotator cuff tendinitis, left 09/08/2016   Overview:  Last Assessment & Plan:  Significant improvement  See OV summary  . Sensorineural hearing loss 11/13/2013  . Stress fracture of pelvis 08/29/2007   Qualifier: History of  By: Drue Flirt  MD, Merrily Brittle    . STRESS FRACTURE, TIBIA 06/15/2009   Qualifier: Diagnosis of  By: Drue Flirt  MD, Merrily Brittle    . Thrombocytopenia ([place]) 01/30/2014  . Tibialis tendinitis 08/12/2009   Qualifier: Diagnosis of  By: Oneida Alar MD, KARL    . Tinnitus 11/13/2013  . TROCHANTERIC BURSITIS, LEFT 04/27/2010   Qualifier: Diagnosis of  By: Ernestina Patches MD,  Remo Lipps    . UNEQUAL LEG LENGTH 09/18/2008   Qualifier: Diagnosis of  By: Oneida Alar MD, KARL      Past Surgical History:  Procedure Laterality Date  . ABDOMINAL SURGERY    . ABDOMINAL SURGERY     for removal of abdominal mass-cancerous  . KNEE SURGERY    . MANDIBLE FRACTURE SURGERY    . NASAL SINUS SURGERY    . TONSILLECTOMY      There were no vitals filed for this visit.   Subjective Assessment - 10/28/19 1626    Subjective Pt reports she had a rough weekend. Walked 3 miles on Sat morning, then immediately crawled in the car to drive to [facility] for the weekend. Was uncomfortable for most of Sat afternoon and Sunday. Did not do exercises because she didn't feel like it. Feels like "hips are getting awfully tight".    Patient Stated Goals "get back to running w/o pain"    Currently in Pain? Yes    Pain Score 2    1-2/10   Pain Location Hip    Pain Orientation Left;Lateral    Pain Descriptors / Indicators Other (Comment)   "annoying"   Pain Type Acute pain    Pain Onset More than a month ago    Pain Frequency Intermittent              [facility] PT Assessment - 10/28/19 1622      Assessment   Medical Diagnosis L hip pain - TFL strain    Referring Provider (PT) Karlton Lemon, MD    Next MD Visit 11/04/19      Strength   Right Hip Flexion 4+/5    Right Hip Extension 4/5    Right Hip External Rotation  4/5    Right Hip Internal Rotation 4+/5    Right Hip ABduction 4/5    Right Hip ADduction 4+/5    Left Hip Flexion 4+/5    Left Hip Extension 4/5    Left Hip External Rotation 4/5    Left Hip Internal Rotation 4+/5    Left Hip ABduction 4/5    Left Hip ADduction 4/5    Right Knee Flexion 5/5    Right Knee Extension 5/5    Left Knee Flexion 5/5    Left Knee Extension 5/5                         [facility] Adult PT Treatment/Exercise - 10/28/19 1622      Knee/Hip Exercises: Aerobic   Nustep L4 x 6 min (UE/LE)  PT Education -  10/28/19 1710    Education Details HEP review & update in preparation for transition to HEP at end of current POC    Person(s) Educated Patient    Methods Explanation;Demonstration;Verbal cues;Handout    Comprehension Verbalized understanding;Returned demonstration;Verbal cues required;Need further instruction            PT Short Term Goals - 08/19/19 1754      PT SHORT TERM GOAL #1   Title Patient will be independent with initial HEP    Status Achieved   08/15/19            PT Long Term Goals - 10/28/19 1711      PT LONG TERM GOAL #1   Title Patient will be independent with ongoing/advanced HEP    Status Partially Met    Target Date 10/31/19      PT LONG TERM GOAL #2   Title Patient to demonstrate improved tissue quality and pliability with reduced pain    Status Partially Met    Target Date 10/31/19      PT LONG TERM GOAL #3   Title Patient will demonstrate improved B proximal LE strength to >/= 4 to 4+/5 for improved stability and ease of mobility    Status Achieved   10/28/19     PT LONG TERM GOAL #4   Title Patient will improve walking tolerance to >/= 3-4 miles w/o pain interference to allow resumption of normal daily activities    Status Partially Met   09/19/19 - able to walk 2-3 miles with "annoying soreness" but tolerable   Target Date 10/31/19      PT LONG TERM GOAL #5   Title Patient will negotiate stairs reciprocally with normal step pattern w/o limitation due to L hip pain or weakness    Status Partially Met    Target Date 10/31/19      PT LONG TERM GOAL #6   Title Patient will be able to resume light jogging/running up to 2 miles w/o L hip pain    Status On-going    Target Date 10/31/19                 Plan - 10/28/19 1712    Clinical Impression Statement [name] is expressing increased frustration with ongoing low-grade stiffness and irritability of L hip with discomfort seeming to fluctuate in location t/o anterior and lateral hip. Her overall  B LE strength has improved significantly since start of PT with current MMT 4/5 to 5/5, however she continues to struggle with functional glute activation during walking/running activities - she is able to better recruit glutes when focused on abdominal activation and pelvic tilt in standing and to a lesser degree in supine but demonstrates limited carryover with other changes of position such as seated and prone. Majority of today's session focusing on review and condensing HEP with emphasis on regular stretching while allowing for rest days from strengthening and running activities. Anticipate transition to HEP upon next visit but may consider 30-day hold to allow for return to PT if needed.    Personal Factors and Comorbidities Comorbidity 3+;Time since onset of injury/illness/exacerbation;Past/Current Experience;Age;Profession;Fitness    Comorbidities h/o L knee scope x 2 for meniscal debridement and cyst removal, Tibial tendinitis, Patellar tendinitis, R Achilles tendinitis, Injury of plantaris muscle or tendon, Tibial stress fracture, Pelvic stress fracture, Metatarsal stress fracture, Hammer toe, L trochanteric bursitis, L RCR, Asthma (mild intermittent asthma without complication), GERD, BPPV, Sensorineural hearing loss, Breast cancer,  Basal cell carcinoma    Examination-Activity Limitations Locomotion Level;Sleep;Stairs;Transfers    Examination-Participation Restrictions Community Activity;Other    Rehab Potential Good    PT Frequency 2x / week    PT Duration 6 weeks    PT Treatment/Interventions ADLs/Self Care Home Management;Cryotherapy;Electrical Stimulation;Moist Heat;Ultrasound;Gait training;Stair training;Functional mobility training;Therapeutic activities;Therapeutic exercise;Balance training;Neuromuscular re-education;Patient/family education;Manual techniques;Passive range of motion;Dry needling;Taping;Joint Manipulations;Iontophoresis '4mg'$ /ml Dexamethasone    PT Next Visit Plan discharge  assessment, transition to HEP, potential 30-day hold    PT Home Exercise Plan 10/28/19 - Refer to pt instrucions    Consulted and Agree with Plan of Care Patient           Patient will benefit from skilled therapeutic intervention in order to improve the following deficits and impairments:  Abnormal gait, Decreased activity tolerance, Decreased knowledge of precautions, Decreased mobility, Decreased strength, Difficulty walking, Increased edema, Increased fascial restricitons, Increased muscle spasms, Impaired perceived functional ability, Pain  Visit Diagnosis: Pain in left hip  Difficulty in walking, not elsewhere classified  Muscle weakness (generalized)  Other muscle spasm  Other symptoms and signs involving the musculoskeletal system     Problem List Patient Active Problem List   Diagnosis Date Noted  . GERD (gastroesophageal reflux disease) 05/07/2019  . Wrist pain, acute, left 04/23/2019  . Knee pain, left 06/15/2017  . Rotator cuff tendinitis, left 09/08/2016  . Right foot pain 04/06/2016  . Injury of plantaris muscle or tendon 10/30/2014  . Achilles tendinitis of right lower extremity 06/19/2014  . Thrombocytopenia ([place]) 01/30/2014  . Benign paroxysmal positional vertigo 11/13/2013  . Insomnia 11/13/2013  . Sensorineural hearing loss 11/13/2013  . Tinnitus 11/13/2013  . Hyperlipidemia LDL goal <130 10/24/2013  . Mild intermittent asthma without complication 02/58/5277  . Right knee pain 04/23/2013  . Patellar tendinitis 03/07/2013  . Muscle tear 11/15/2012  . Metatarsal stress fracture 08/29/2012  . Numbness and tingling of foot 03/29/2012  . Ankle pain 08/17/2011  . Chest pain in adult 08/17/2011  . TROCHANTERIC BURSITIS, LEFT 04/27/2010  . LEG PAIN, RIGHT 09/22/2009  . TIBIALIS TENDINITIS 08/12/2009  . STRESS FRACTURE, TIBIA 06/15/2009  . UNEQUAL LEG LENGTH 09/18/2008  . CARCINOMA, BASAL CELL 02/07/2008  . Malignant neoplasm of female breast ([place])  02/07/2008  . STRESS FRACTURE OF PELVIS 08/29/2007  . HAMMER TOE, OTHER, ACQUIRED 10/13/2006  . ALLERGIC RHINITIS 10/12/2006  . Asthma 10/12/2006  . Allergic rhinitis 10/12/2006    Percival Spanish, PT, MPT 10/28/2019, 7:38 PM  [facility] [address]  [place], [place], [address] Phone: [phone]   Fax:  [phone]  Name: SHI GROSE MRN: [id number] Date of Birth: 1950/11/20

## 2019-10-28 NOTE — Patient Instructions (Signed)
    Home exercise program created by Abryana Lykens, PT.  For questions, please contact Vaniya Augspurger via phone at [phone] or email at [email or web address]  [facility] [place] [address]  [address] [place], [place], [address] Phone: [phone]   Fax:  [phone]    

## 2019-10-31 ENCOUNTER — Other Ambulatory Visit: Payer: Self-pay

## 2019-10-31 ENCOUNTER — Encounter: Payer: Self-pay | Admitting: Physical Therapy

## 2019-10-31 ENCOUNTER — Ambulatory Visit: Payer: 59 | Admitting: Physical Therapy

## 2019-10-31 DIAGNOSIS — M6281 Muscle weakness (generalized): Secondary | ICD-10-CM | POA: Diagnosis not present

## 2019-10-31 DIAGNOSIS — R262 Difficulty in walking, not elsewhere classified: Secondary | ICD-10-CM

## 2019-10-31 DIAGNOSIS — R29898 Other symptoms and signs involving the musculoskeletal system: Secondary | ICD-10-CM | POA: Diagnosis not present

## 2019-10-31 DIAGNOSIS — M25552 Pain in left hip: Secondary | ICD-10-CM

## 2019-10-31 DIAGNOSIS — M62838 Other muscle spasm: Secondary | ICD-10-CM

## 2019-10-31 NOTE — Therapy (Addendum)
[facility] [address]  [place], [place], [address] Phone: [phone]   Fax:  [phone]  Physical Therapy Treatment / Discharge Summary  Patient Details  Name: Rhonda [name] MRN: [id number] Date of Birth: 06-Aug-1950 Referring Provider (PT): Karlton Lemon, MD   Encounter Date: 10/31/2019   PT End of Session - 10/31/19 1617    Visit Number 23    Number of Visits 24    Date for PT Re-Evaluation 10/31/19    Authorization Type [facility]    PT Start Time 1617    PT Stop Time 1652    PT Time Calculation (min) 35 min    Activity Tolerance Patient tolerated treatment well    Behavior During Therapy [facility] for tasks assessed/performed           Past Medical History:  Diagnosis Date  . Achilles tendinitis of right lower extremity 06/19/2014   US showed minor changes in December 15  Most sxs seem to be enthesopathy  Note had reaction to NTG in past  Overview:  Overview:  US showed minor changes in December 15  Most sxs seem to be enthesopathy  Note had reaction to NTG in past  Last Assessment & Plan:  Recent irritation in the race appears to be a partial plantaris tear  This continues to give her some Achilles symptoms  I think a period  . ALLERGIC RHINITIS 10/12/2006   Qualifier: Diagnosis of  By: McDiarmid MD, Sherren Mocha    . Ankle pain 08/17/2011  . Asthma 10/12/2006   Qualifier: Diagnosis of  By: McDiarmid MD, Sherren Mocha    Overview:  Overview:  Qualifier: Diagnosis of  By: McDiarmid MD, Sherren Mocha   . CARCINOMA, BASAL CELL 02/07/2008   Qualifier: History of  By: Drue Flirt  MD, Merrily Brittle    Overview:  Overview:  Qualifier: History of  By: Drue Flirt  MD, Merrily Brittle   Overview:  Qualifier: History of  By: Drue Flirt  MD, Merrily Brittle   . Chest pain in adult 08/17/2011   This has worrisome features particularly with her level of exertion   . HAMMER TOE, OTHER, ACQUIRED 10/13/2006   Qualifier: Diagnosis of  By: McDiarmid MD, Sherren Mocha    Overview:  Overview:   Qualifier: Diagnosis of  By: McDiarmid MD, Sherren Mocha   . High cholesterol   . Hyperlipidemia LDL goal <130 10/24/2013  . Injury of plantaris muscle or tendon 10/30/2014  . Insomnia 11/13/2013  . Knee pain, left 06/15/2017  . LEG PAIN, RIGHT 09/22/2009   Qualifier: Diagnosis of  By: Oneida Alar MD, KARL    . Malignant neoplasm of female breast ([place]) 02/07/2008   Qualifier: History of  By: Drue Flirt  MD, Merrily Brittle    Overview:  Overview:  Qualifier: History of  By: Drue Flirt  MD, Merrily Brittle  Overview:  Overview:  Qualifier: History of  By: Drue Flirt  MD, Merrily Brittle   . Metatarsal stress fracture 08/29/2012   This is at the distal shaft of the fifth metatarsal left foot   . Mild intermittent asthma without complication 4/56/2563  . Muscle tear 11/15/2012   Left rectus femoris; also like tendon tear   . Numbness and tingling of foot 03/29/2012  . Patellar tendinitis 03/07/2013   I suspect this occurred because she did less crosstraining before this marathon and was probably hamstring dominant   . Right foot pain 04/06/2016  . Right knee pain 04/23/2013   04/23/13  ultrasound today RT knee  revealed no significant effusion The quadriceps  tendon is intact as is the patellar tendon The medial meniscus was normal The lateral meniscus had an area of calcification and a very small area of splitting along the midline to the posterior third of the joint line   . Rotator cuff tendinitis, left 09/08/2016   Overview:  Last Assessment & Plan:  Significant improvement  See OV summary  . Sensorineural hearing loss 11/13/2013  . Stress fracture of pelvis 08/29/2007   Qualifier: History of  By: Drue Flirt  MD, Merrily Brittle    . STRESS FRACTURE, TIBIA 06/15/2009   Qualifier: Diagnosis of  By: Drue Flirt  MD, Merrily Brittle    . Thrombocytopenia ([place]) 01/30/2014  . Tibialis tendinitis 08/12/2009   Qualifier: Diagnosis of  By: Oneida Alar MD, KARL    . Tinnitus 11/13/2013  . TROCHANTERIC BURSITIS, LEFT 04/27/2010   Qualifier: Diagnosis of  By: Ernestina Patches MD, Remo Lipps    .  UNEQUAL LEG LENGTH 09/18/2008   Qualifier: Diagnosis of  By: Oneida Alar MD, KARL      Past Surgical History:  Procedure Laterality Date  . ABDOMINAL SURGERY    . ABDOMINAL SURGERY     for removal of abdominal mass-cancerous  . KNEE SURGERY    . MANDIBLE FRACTURE SURGERY    . NASAL SINUS SURGERY    . TONSILLECTOMY      There were no vitals filed for this visit.   Subjective Assessment - 10/31/19 1619    Subjective Pt having a bad day as her cat died yesterday. Reports some soreness after walking 2.5 miles at lunch today.    How long can you walk comfortably? able to walk 3 miles easily    Patient Stated Goals "get back to running w/o pain"    Currently in Pain? No/denies              [facility] PT Assessment - 10/31/19 1617      Assessment   Medical Diagnosis L hip pain - TFL strain    Referring Provider (PT) Karlton Lemon, MD    Next MD Visit 11/04/19      Observation/Other Assessments   Focus on Therapeutic Outcomes (FOTO)  Hip - 75% (25% limitation)      Strength   Right Hip Flexion 4+/5    Right Hip Extension 4/5    Right Hip External Rotation  4/5    Right Hip Internal Rotation 4+/5    Right Hip ABduction 4/5    Right Hip ADduction 4+/5    Left Hip Flexion 4+/5    Left Hip Extension 4/5    Left Hip External Rotation 4/5    Left Hip Internal Rotation 4+/5    Left Hip ABduction 4/5    Left Hip ADduction 4/5    Right Knee Flexion 5/5    Right Knee Extension 5/5    Left Knee Flexion 5/5    Left Knee Extension 5/5                                   PT Short Term Goals - 08/19/19 1754      PT SHORT TERM GOAL #1   Title Patient will be independent with initial HEP    Status Achieved   08/15/19            PT Long Term Goals - 10/31/19 1622      PT LONG TERM GOAL #1   Title Patient will be independent with ongoing/advanced HEP  Status Achieved   10/31/19     PT LONG TERM GOAL #2   Title Patient to demonstrate improved tissue quality  and pliability with reduced pain    Status Partially Met      PT LONG TERM GOAL #3   Title Patient will demonstrate improved B proximal LE strength to >/= 4 to 4+/5 for improved stability and ease of mobility    Status Achieved   10/28/19     PT LONG TERM GOAL #4   Title Patient will improve walking tolerance to >/= 3-4 miles w/o pain interference to allow resumption of normal daily activities    Status Achieved   10/31/19     PT LONG TERM GOAL #5   Title Patient will negotiate stairs reciprocally with normal step pattern w/o limitation due to L hip pain or weakness    Status Partially Met   10/31/19 - still demonsttrates slight pelvic obliquity with pain in L glutes on ascent     PT LONG TERM GOAL #6   Title Patient will be able to resume light jogging/running up to 2 miles w/o L hip pain    Status Not Met   10/31/19 - pt has not been running recently, but last time she tried she was able to run 1.5 miles during a 5K with manageable hip pain                Plan - 10/31/19 1652    Clinical Impression Statement Rhonda [name] notes improvement with PT but continues to express frustration that her pain is not resolved after 12 weeks. She currently denies pain but did note some soreness after walking 2.5 miles at lunch today. She has not attempted running since running 1.5 miles of a recent 5K earlier this month. Pain remains localized to L gluteus medius/minimus with increased muscle tension and ttp persisting - she has had some relief with manual STM/DTM by PT but has been unable to replicate this with self-STM using a tennis ball on the wall as instructed and is not interested in trying DN as suggested by PT. She has demonstrated improvement in proximal LE strength since start of PT but still with mild weakness present in hip extension, abd/adduction and ER (L>R), although better ability to recruit muscles than earlier in POC. LTGs mostly met or partially met with only running goal not met. She would  like to try continuing with her HEP for another 4 weeks and if no further improvement noted, she plans to request MRI. Patient cautioned to continue to balance exercise/activity with appropriate rest days to avoid further overuse injury. She will remain on hold for 30-days in the event that she feels the need to return to PT.    Personal Factors and Comorbidities Comorbidity 3+;Time since onset of injury/illness/exacerbation;Past/Current Experience;Age;Profession;Fitness    Comorbidities h/o L knee scope x 2 for meniscal debridement and cyst removal, Tibial tendinitis, Patellar tendinitis, R Achilles tendinitis, Injury of plantaris muscle or tendon, Tibial stress fracture, Pelvic stress fracture, Metatarsal stress fracture, Hammer toe, L trochanteric bursitis, L RCR, Asthma (mild intermittent asthma without complication), GERD, BPPV, Sensorineural hearing loss, Breast cancer, Basal cell carcinoma    Examination-Activity Limitations Locomotion Level;Sleep;Stairs;Transfers    Examination-Participation Restrictions Community Activity;Other    PT Treatment/Interventions ADLs/Self Care Home Management;Cryotherapy;Electrical Stimulation;Moist Heat;Ultrasound;Gait training;Stair training;Functional mobility training;Therapeutic activities;Therapeutic exercise;Balance training;Neuromuscular re-education;Patient/family education;Manual techniques;Passive range of motion;Dry needling;Taping;Joint Manipulations;Iontophoresis 15m/ml Dexamethasone    PT Next Visit Plan transition to HEP with 30-day hold  PT Home Exercise Plan 10/28/19 - Refer to pt instrucions    Consulted and Agree with Plan of Care Patient           Patient will benefit from skilled therapeutic intervention in order to improve the following deficits and impairments:  Abnormal gait, Decreased activity tolerance, Decreased knowledge of precautions, Decreased mobility, Decreased strength, Difficulty walking, Increased edema, Increased fascial  restricitons, Increased muscle spasms, Impaired perceived functional ability, Pain  Visit Diagnosis: Pain in left hip  Difficulty in walking, not elsewhere classified  Muscle weakness (generalized)  Other muscle spasm  Other symptoms and signs involving the musculoskeletal system     Problem List Patient Active Problem List   Diagnosis Date Noted  . GERD (gastroesophageal reflux disease) 05/07/2019  . Wrist pain, acute, left 04/23/2019  . Knee pain, left 06/15/2017  . Rotator cuff tendinitis, left 09/08/2016  . Right foot pain 04/06/2016  . Injury of plantaris muscle or tendon 10/30/2014  . Achilles tendinitis of right lower extremity 06/19/2014  . Thrombocytopenia ([place]) 01/30/2014  . Benign paroxysmal positional vertigo 11/13/2013  . Insomnia 11/13/2013  . Sensorineural hearing loss 11/13/2013  . Tinnitus 11/13/2013  . Hyperlipidemia LDL goal <130 10/24/2013  . Mild intermittent asthma without complication 46/65/9935  . Right knee pain 04/23/2013  . Patellar tendinitis 03/07/2013  . Muscle tear 11/15/2012  . Metatarsal stress fracture 08/29/2012  . Numbness and tingling of foot 03/29/2012  . Ankle pain 08/17/2011  . Chest pain in adult 08/17/2011  . TROCHANTERIC BURSITIS, LEFT 04/27/2010  . LEG PAIN, RIGHT 09/22/2009  . TIBIALIS TENDINITIS 08/12/2009  . STRESS FRACTURE, TIBIA 06/15/2009  . UNEQUAL LEG LENGTH 09/18/2008  . CARCINOMA, BASAL CELL 02/07/2008  . Malignant neoplasm of female breast ([place]) 02/07/2008  . STRESS FRACTURE OF PELVIS 08/29/2007  . HAMMER TOE, OTHER, ACQUIRED 10/13/2006  . ALLERGIC RHINITIS 10/12/2006  . Asthma 10/12/2006  . Allergic rhinitis 10/12/2006    Percival Spanish, PT, MPT 10/31/2019, 8:38 PM  [facility] [address]  [place], [place], [address] Phone: [phone]   Fax:  [phone]  Name: Rhonda [name] MRN: [id number] Date of Birth: 05/31/1950   PHYSICAL  THERAPY DISCHARGE SUMMARY  Visits from Start of Care: 23  Current functional level related to goals / functional outcomes:   Refer to above clinical impression for status as of last visit on 10/31/2019. Patient was placed on hold for 30 days and has not needed to return to PT, therefore will proceed with discharge from PT for this episode.   Remaining deficits:   As above.   Education / Equipment:   HEP  Plan: Patient agrees to discharge.  Patient goals were partially met. Patient is being discharged due to being pleased with the current functional level.  ?????     Percival Spanish, PT, MPT 12/17/19, 9:34 AM  [facility] [place], [place], [address] Phone: [phone]   Fax:  [phone]

## 2019-11-01 ENCOUNTER — Encounter: Payer: Self-pay | Admitting: Emergency Medicine

## 2019-11-01 ENCOUNTER — Ambulatory Visit: Payer: 59 | Admitting: Emergency Medicine

## 2019-11-01 DIAGNOSIS — J4520 Mild intermittent asthma, uncomplicated: Secondary | ICD-10-CM | POA: Diagnosis not present

## 2019-11-01 MED ORDER — ALBUTEROL SULFATE HFA 108 (90 BASE) MCG/ACT IN AERS: 2.0000 | INHALATION_SPRAY | Freq: Four times a day (QID) | RESPIRATORY_TRACT | 2 refills | Status: DC | PRN

## 2019-11-01 MED FILL — ALBUTEROL SULFATE HFA 108 (: 108 (90 BAS | 25 days supply | Qty: 9 | Fill #0

## 2019-11-01 NOTE — Patient Instructions (Signed)
We will refill your albuterol today.  Please keep available to use 2 puffs if needed for shortness of breath, chest tightness. You can also use 2 puffs prior to exercise. Use your omeprazole as needed for reflux Consider starting your over-the-counter allergy medications during the allergy season Follow with Dr Lamonte Sakai in 6 months or sooner if you have any problems

## 2019-11-01 NOTE — Progress Notes (Signed)
Subjective:    Patient ID: Rhonda [name], female    DOB: 23-Jul-1950, 69 y.o.   MRN: [id number]  Asthma There is no cough, shortness of breath or wheezing. Pertinent negatives include no ear pain, fever, headaches, postnasal drip, rhinorrhea, sneezing, sore throat or trouble swallowing. Her past medical history is significant for asthma.     ROV 05/07/2019 --follow-up visit for pleasant 69 year old woman with a history of mild asthma and chronic upper airway irritation with cough.  There is an exercise-induced component to her airflow obstruction and she has used albuterol successfully prior to exercise.  She has been treated for GERD with Prilosec which she uses prn, not currently on any rhinitis therapy.  She has albuterol which she has not required at all recently. She had started training to run again but had trouble doing so due to L broken wrist.   ROV 11/01/19 --69 year old woman who follows today for mild intermittent asthma and chronic upper irritation with cough.  She does have some evidence for exertional bronchospasm and has benefited from using albuterol before exercise.  She has had a very stressful few days due to family events, has noticed some chest tightness, discomfort especially when she is anxious or upset. Overall she is doing well - hasn't used any albuterol since last time. Her exercise is still limited by her hip injury - she is completing physical therapy. Still using omeprazole prn - rare use.    Review of Systems  Constitutional: Negative for fever and unexpected weight change.  HENT: Negative for congestion, dental problem, ear pain, nosebleeds, postnasal drip, rhinorrhea, sinus pressure, sneezing, sore throat and trouble swallowing.   Eyes: Negative for redness and itching.  Respiratory: Positive for chest tightness. Negative for cough, shortness of breath and wheezing.   Cardiovascular: Negative for palpitations and leg swelling.  Gastrointestinal: Negative for  nausea and vomiting.  Genitourinary: Negative for dysuria.  Musculoskeletal: Negative for joint swelling.  Skin: Negative for rash.  Neurological: Negative for headaches.  Hematological: Does not bruise/bleed easily.  Psychiatric/Behavioral: Negative for dysphoric mood. The patient is not nervous/anxious.        Objective:   Physical Exam Vitals:   11/01/19 1038  BP: 118/70  Pulse: 75  Temp: 98.3 F (36.8 C)  TempSrc: Oral  SpO2: 98%  Weight: 139 lb 6.4 oz (63.2 kg)  Height: 5' (1.524 m)    Gen: Pleasant, well-nourished, in no distress,  normal affect  ENT: No lesions,  mouth clear,  oropharynx clear, no postnasal drip  Neck: No JVD, no stridor  Lungs: No use of accessory muscles, no dullness to percussion, clear without rales or rhonchi  Cardiovascular: RRR, heart sounds normal, no murmur or gallops, no peripheral edema  Musculoskeletal: No deformities, no cyanosis or clubbing  Neuro: alert, non focal  Skin: Warm, no lesions or rash      Assessment & Plan:  Mild intermittent asthma without complication Well-controlled at this time.  No exacerbations.  No albuterol use.  We will refill this for her so she can have it available.  GERD and allergies are well controlled.  We did discuss the fact that both can influence asthma control if they flare.  She will treat if needed.  We will refill your albuterol today.  Please keep available to use 2 puffs if needed for shortness of breath, chest tightness. You can also use 2 puffs prior to exercise. Use your omeprazole as needed for reflux Consider starting your over-the-counter allergy medications during  the allergy season Follow with Dr Lamonte Sakai in 6 months or sooner if you have any problems  Baltazar Apo, MD, PhD 11/01/2019, 10:54 AM [facility] [phone] or if no answer [phone]

## 2019-11-01 NOTE — Assessment & Plan Note (Signed)
Well-controlled at this time.  No exacerbations.  No albuterol use.  We will refill this for her so she can have it available.  GERD and allergies are well controlled.  We did discuss the fact that both can influence asthma control if they flare.  She will treat if needed.  We will refill your albuterol today.  Please keep available to use 2 puffs if needed for shortness of breath, chest tightness. You can also use 2 puffs prior to exercise. Use your omeprazole as needed for reflux Consider starting your over-the-counter allergy medications during the allergy season Follow with Dr Lamonte Sakai in 6 months or sooner if you have any problems

## 2019-11-04 ENCOUNTER — Encounter: Payer: Self-pay | Admitting: Family Medicine

## 2019-11-04 ENCOUNTER — Other Ambulatory Visit: Payer: Self-pay

## 2019-11-04 ENCOUNTER — Ambulatory Visit: Payer: 59 | Admitting: Family Medicine

## 2019-11-04 VITALS — BP 138/84 | Ht 60.0 in | Wt 138.8 lb

## 2019-11-04 DIAGNOSIS — M25552 Pain in left hip: Secondary | ICD-10-CM

## 2019-11-04 HISTORY — DX: Pain in left hip: M25.552

## 2019-11-04 NOTE — Assessment & Plan Note (Signed)
Left hip pain improving but still persistent with efforts to run for the last 12 weeks.  Patient has been doing formal PT and regularly doing all home sign PT.  She does want to take 4 weeks to focus on a Pilates routine and after that we will consider MRI evaluation to further differentiate between ITB syndrome versus potential gluteus tendon tear of the abductor muscles

## 2019-11-04 NOTE — Progress Notes (Signed)
    SUBJECTIVE:   CHIEF COMPLAINT / HPI: Left hip pain x12 weeks  Has been seeing physical therapy regularly, has been doing all home exercises.  Thinks there is only minimal improvement she is unable to run at this time.  Was initially diagnosed to be ITB band syndrome with glute med and min weakness, she said there was some discrepancy with physical therapy and she believes that there might be an issue with being a "quad heavy "runner as she was noted to not be activating her glutes while running.  She does say that pain level with ambulation around the house has gotten significantly better, she thinks that her abductor strength has improved.  PERTINENT  PMH / PSH:   OBJECTIVE:   BP 138/84   Ht 5' (1.524 m)   Wt 138 lb 12.8 oz (63 kg)   BMI 27.11 kg/m   General: Alert and in no medical distress  Left hip: Inspection: No asymmetric swelling or prominence Palpation: No tenderness including over trochanter, glut medius and minimus, piriformis.   ROM: Normal range of motion with both internal and external rotation as well as flexion and extension Strength: Good abductor strength Stability: No indication of hip instability Special tests: Normal Trendelenburg, normal Renee test, no pain with internal/external rotation of the hip. Neurovascular: No distal neurovascular deficits identified  ASSESSMENT/PLAN:   Left hip pain Left hip pain improving but still persistent with efforts to run for the last 12 weeks.  Patient has been doing formal PT and regularly doing all home exercises.  She does want to take 4 weeks to focus on a Pilates routine through different PT and after that we will consider MRI evaluation to further assess for possible gluteus tendon tear.   Sherene Sires, [place]

## 2019-11-04 NOTE — Patient Instructions (Signed)
Call me in 4 weeks if you're not improving and we will go ahead with an MRI of your hip to assess for a glut tendon tear. If doing well, follow up with me in 6 weeks.

## 2019-11-07 DIAGNOSIS — D2271 Melanocytic nevi of right lower limb, including hip: Secondary | ICD-10-CM | POA: Diagnosis not present

## 2019-11-07 DIAGNOSIS — D225 Melanocytic nevi of trunk: Secondary | ICD-10-CM | POA: Diagnosis not present

## 2019-11-07 DIAGNOSIS — Z85828 Personal history of other malignant neoplasm of skin: Secondary | ICD-10-CM | POA: Diagnosis not present

## 2019-11-07 DIAGNOSIS — L814 Other melanin hyperpigmentation: Secondary | ICD-10-CM | POA: Diagnosis not present

## 2019-11-07 DIAGNOSIS — L309 Dermatitis, unspecified: Secondary | ICD-10-CM | POA: Diagnosis not present

## 2019-11-07 DIAGNOSIS — L821 Other seborrheic keratosis: Secondary | ICD-10-CM | POA: Diagnosis not present

## 2019-11-07 DIAGNOSIS — L578 Other skin changes due to chronic exposure to nonionizing radiation: Secondary | ICD-10-CM | POA: Diagnosis not present

## 2019-11-13 ENCOUNTER — Other Ambulatory Visit (HOSPITAL_BASED_OUTPATIENT_CLINIC_OR_DEPARTMENT_OTHER): Payer: Self-pay | Admitting: Internal Medicine

## 2019-11-13 MED FILL — VENTOLIN HFA 90 MCG INHALER: 108 (90 BAS | 25 days supply | Qty: 18 | Fill #0

## 2019-11-13 MED FILL — ROSUVASTATIN CALCIUM 10 MG: 10 | 90 days supply | Qty: 90 | Fill #0

## 2019-11-18 NOTE — Telephone Encounter (Signed)
Dr. Lamonte Sakai, See patient comment.  Just an FYI.

## 2019-12-02 ENCOUNTER — Ambulatory Visit: Payer: 59 | Admitting: Family Medicine

## 2019-12-02 ENCOUNTER — Encounter: Payer: Self-pay | Admitting: Family Medicine

## 2019-12-02 ENCOUNTER — Other Ambulatory Visit: Payer: Self-pay

## 2019-12-02 VITALS — BP 118/82 | Ht 60.0 in | Wt 140.0 lb

## 2019-12-02 DIAGNOSIS — M25552 Pain in left hip: Secondary | ICD-10-CM

## 2019-12-02 NOTE — Progress Notes (Signed)
PCP: Loraine Leriche., MD  Subjective:   HPI: Patient is a 68 y.o. female here for left hip pain.  6/28: Has been seeing physical therapy regularly, has been doing all home exercises.  Thinks there is only minimal improvement she is unable to run at this time.  Was initially diagnosed to be ITB band syndrome with glute med and min weakness, she said there was some discrepancy with physical therapy and she believes that there might be an issue with being a "quad heavy "runner as she was noted to not be activating her glutes while running.  She does say that pain level with ambulation around the house has gotten significantly better, she thinks that her abductor strength has improved.  7/26: Patient reports she's doing much better. Doing physical therapy with focus on proper form which has made a big difference and Reformer to isolate muscle groups with pilates. No issues with hills and steps are much better. Did 2.5 miles on Saturday in intervals and did well. No other complaints.  Past Medical History:  Diagnosis Date  . Achilles tendinitis of right lower extremity 06/19/2014   US showed minor changes in December 15  Most sxs seem to be enthesopathy  Note had reaction to NTG in past  Overview:  Overview:  US showed minor changes in December 15  Most sxs seem to be enthesopathy  Note had reaction to NTG in past  Last Assessment & Plan:  Recent irritation in the race appears to be a partial plantaris tear  This continues to give her some Achilles symptoms  I think a period  . ALLERGIC RHINITIS 10/12/2006   Qualifier: Diagnosis of  By: McDiarmid MD, Sherren Mocha    . Ankle pain 08/17/2011  . Asthma 10/12/2006   Qualifier: Diagnosis of  By: McDiarmid MD, Sherren Mocha    Overview:  Overview:  Qualifier: Diagnosis of  By: McDiarmid MD, Sherren Mocha   . CARCINOMA, BASAL CELL 02/07/2008   Qualifier: History of  By: Drue Flirt  MD, Merrily Brittle    Overview:  Overview:  Qualifier: History of  By: Drue Flirt  MD, Merrily Brittle   Overview:   Qualifier: History of  By: Drue Flirt  MD, Merrily Brittle   . Chest pain in adult 08/17/2011   This has worrisome features particularly with her level of exertion   . HAMMER TOE, OTHER, ACQUIRED 10/13/2006   Qualifier: Diagnosis of  By: McDiarmid MD, Sherren Mocha    Overview:  Overview:  Qualifier: Diagnosis of  By: McDiarmid MD, Sherren Mocha   . High cholesterol   . Hyperlipidemia LDL goal <130 10/24/2013  . Injury of plantaris muscle or tendon 10/30/2014  . Insomnia 11/13/2013  . Knee pain, left 06/15/2017  . LEG PAIN, RIGHT 09/22/2009   Qualifier: Diagnosis of  By: Oneida Alar MD, KARL    . Malignant neoplasm of female breast ([place]) 02/07/2008   Qualifier: History of  By: Drue Flirt  MD, Merrily Brittle    Overview:  Overview:  Qualifier: History of  By: Drue Flirt  MD, Merrily Brittle  Overview:  Overview:  Qualifier: History of  By: Drue Flirt  MD, Merrily Brittle   . Metatarsal stress fracture 08/29/2012   This is at the distal shaft of the fifth metatarsal left foot   . Mild intermittent asthma without complication 5/36/6440  . Muscle tear 11/15/2012   Left rectus femoris; also like tendon tear   . Numbness and tingling of foot 03/29/2012  . Patellar tendinitis 03/07/2013   I suspect this occurred because she did less crosstraining before this marathon  and was probably hamstring dominant   . Right foot pain 04/06/2016  . Right knee pain 04/23/2013   04/23/13  ultrasound today RT knee  revealed no significant effusion The quadriceps tendon is intact as is the patellar tendon The medial meniscus was normal The lateral meniscus had an area of calcification and a very small area of splitting along the midline to the posterior third of the joint line   . Rotator cuff tendinitis, left 09/08/2016   Overview:  Last Assessment & Plan:  Significant improvement  See OV summary  . Sensorineural hearing loss 11/13/2013  . Stress fracture of pelvis 08/29/2007   Qualifier: History of  By: Drue Flirt  MD, Merrily Brittle    . STRESS FRACTURE, TIBIA 06/15/2009   Qualifier: Diagnosis  of  By: Drue Flirt  MD, Merrily Brittle    . Thrombocytopenia ([place]) 01/30/2014  . Tibialis tendinitis 08/12/2009   Qualifier: Diagnosis of  By: Oneida Alar MD, KARL    . Tinnitus 11/13/2013  . TROCHANTERIC BURSITIS, LEFT 04/27/2010   Qualifier: Diagnosis of  By: Ernestina Patches MD, Remo Lipps    . UNEQUAL LEG LENGTH 09/18/2008   Qualifier: Diagnosis of  By: Oneida Alar MD, KARL      Current Outpatient Medications on File Prior to Visit  Medication Sig Dispense Refill  . albuterol (PROVENTIL HFA) 108 (90 Base) MCG/ACT inhaler Inhale 2 puffs into the lungs every 6 (six) hours as needed for wheezing. 8 g 2  . omeprazole (PRILOSEC) 20 MG capsule Take 1 capsule by mouth as needed.    . rosuvastatin (CRESTOR) 10 MG tablet Take by mouth.     No current facility-administered medications on file prior to visit.    Past Surgical History:  Procedure Laterality Date  . ABDOMINAL SURGERY    . ABDOMINAL SURGERY     for removal of abdominal mass-cancerous  . KNEE SURGERY    . MANDIBLE FRACTURE SURGERY    . NASAL SINUS SURGERY    . TONSILLECTOMY      Allergies  Allergen Reactions  . Acetaminophen     REACTION: mild throat swelling if takes more than a few consecutive doses  . Aspirin     REACTION: anaphylaxis  . Nsaids     REACTION: face \\T \throat swelling, sneezing and difficulty breathing  . Pravastatin Other (See Comments)    "joint pains"    Social History   Socioeconomic History  . Marital status: Married    Spouse name: Not on file  . Number of children: Not on file  . Years of education: Not on file  . Highest education level: Not on file  Occupational History  . Not on file  Tobacco Use  . Smoking status: Never Smoker  . Smokeless tobacco: Never Used  Vaping Use  . Vaping Use: Never used  Substance and Sexual Activity  . Alcohol use: Yes    Comment: occ glass of wine  . Drug use: No  . Sexual activity: Not on file  Other Topics Concern  . Not on file  Social History Narrative  . Not on file    Social Determinants of Health   Financial Resource Strain:   . Difficulty of Paying Living Expenses:   Food Insecurity:   . Worried About Charity fundraiser in the Last Year:   . Arboriculturist in the Last Year:   Transportation Needs:   . Film/video editor (Medical):   Marland Kitchen Lack of Transportation (Non-Medical):   Physical Activity:   . Days  of Exercise per Week:   . Minutes of Exercise per Session:   Stress:   . Feeling of Stress :   Social Connections:   . Frequency of Communication with Friends and Family:   . Frequency of Social Gatherings with Friends and Family:   . Attends Religious Services:   . Active Member of Clubs or Organizations:   . Attends Archivist Meetings:   Marland Kitchen Marital Status:   Intimate Partner Violence:   . Fear of Current or Ex-Partner:   . Emotionally Abused:   Marland Kitchen Physically Abused:   . Sexually Abused:     Family History  Problem Relation Age of Onset  . COPD Mother   . Pulmonary fibrosis Father   . Parkinson's disease Father   . Diabetes Father   . Thyroid disease Sister   . Diabetes Sister   . Heart attack Neg Hx     BP 118/82   Ht 5' (1.524 m)   Wt 140 lb (63.5 kg)   BMI 27.34 kg/m   Review of Systems: See HPI above.     Objective:  Physical Exam:  Gen: NAD, comfortable in exam room  Left hip: No deformity. FROM with 5/5 strength including hip abduction. No tenderness to palpation. NVI distally. Negative logroll, faber, fadir.   Assessment & Plan:  1. Left hip pain - significant improvement in pain, strength.  Exam is normal currently.  Encouraged to continue with home exercises, physical therapy as she has been.  Slowly increase her running interval.  Call us if she needs anything otherwise f/u prn.  Tylenol if needed.

## 2020-01-29 ENCOUNTER — Ambulatory Visit: Payer: 59 | Admitting: Family Medicine

## 2020-01-29 MED FILL — ROSUVASTATIN CALCIUM 10 MG: 10 | 90 days supply | Qty: 90 | Fill #1

## 2020-01-31 ENCOUNTER — Other Ambulatory Visit (HOSPITAL_COMMUNITY): Payer: Self-pay | Admitting: Internal Medicine

## 2020-01-31 DIAGNOSIS — Z1389 Encounter for screening for other disorder: Secondary | ICD-10-CM | POA: Diagnosis not present

## 2020-01-31 DIAGNOSIS — D696 Thrombocytopenia, unspecified: Secondary | ICD-10-CM | POA: Diagnosis not present

## 2020-01-31 DIAGNOSIS — J4520 Mild intermittent asthma, uncomplicated: Secondary | ICD-10-CM | POA: Diagnosis not present

## 2020-01-31 DIAGNOSIS — Z131 Encounter for screening for diabetes mellitus: Secondary | ICD-10-CM | POA: Diagnosis not present

## 2020-01-31 DIAGNOSIS — K219 Gastro-esophageal reflux disease without esophagitis: Secondary | ICD-10-CM | POA: Diagnosis not present

## 2020-01-31 DIAGNOSIS — Z0000 Encounter for general adult medical examination without abnormal findings: Secondary | ICD-10-CM | POA: Diagnosis not present

## 2020-01-31 DIAGNOSIS — E785 Hyperlipidemia, unspecified: Secondary | ICD-10-CM | POA: Diagnosis not present

## 2020-02-19 ENCOUNTER — Ambulatory Visit: Payer: 59 | Admitting: Cardiology

## 2020-02-26 DIAGNOSIS — H2513 Age-related nuclear cataract, bilateral: Secondary | ICD-10-CM | POA: Diagnosis not present

## 2020-03-13 DIAGNOSIS — Z23 Encounter for immunization: Secondary | ICD-10-CM | POA: Diagnosis not present

## 2020-03-17 DIAGNOSIS — L821 Other seborrheic keratosis: Secondary | ICD-10-CM | POA: Diagnosis not present

## 2020-03-19 DIAGNOSIS — E7800 Pure hypercholesterolemia, unspecified: Secondary | ICD-10-CM | POA: Insufficient documentation

## 2020-03-19 DIAGNOSIS — E785 Hyperlipidemia, unspecified: Secondary | ICD-10-CM | POA: Insufficient documentation

## 2020-03-26 NOTE — Progress Notes (Signed)
Cardiology Office Note:    Date:  03/27/2020   ID:  Rhonda [name], DOB 1950-06-24, MRN [id number]  PCP:  Loraine Leriche., MD  Cardiologist:  Shirlee More, MD    Referring MD: Loraine Leriche.,*    ASSESSMENT:    1. Chest pain of uncertain etiology   2. Hyperlipidemia LDL goal <130    PLAN:    In order of problems listed above:  1. Cardiac CTA to evaluate ongoing chest pain with previous normal stress echocardiogram especially important and she has had a break in her exercise and is attempting to train now for marathon. 2. Continue high intensity statin   Next appointment: 3 months to review the results CTA   Medication Adjustments/Labs and Tests Ordered: Current medicines are reviewed at length with the patient today.  Concerns regarding medicines are outlined above.  Orders Placed This Encounter  Procedures  . CT CORONARY MORPH W/CTA COR W/SCORE W/CA W/CM &/OR WO/CM  . CT CORONARY FRACTIONAL FLOW RESERVE DATA PREP  . CT CORONARY FRACTIONAL FLOW RESERVE FLUID ANALYSIS  . Basic metabolic panel  . EKG 12-Lead   Meds ordered this encounter  Medications  . metoprolol tartrate (LOPRESSOR) 100 MG tablet    Sig: Take 1 tablet (100 mg total) by mouth once for 1 dose. Take two hours prior to your cardiac CT    Dispense:  1 tablet    Refill:  0    Chief Complaint  Patient presents with  . Follow-up  . Chest Pain    History of Present Illness:    Rhonda [name] is a 69 y.o. female with a hx of chest pain with exercise induced asthma.  She had a stress echo 03/07/2018 which was normal and a high exercise tolerance 13.4 METS  last seen 11/28/2018.  Compliance with diet, lifestyle and medications: Yes  She has exercise-induced asthma and continues to have exertional chest pain she can run through it and at times does not occur after using her bronchodilator but there are times that it is ongoing and not relieved as she continues to exercise.  She  wonders in the back of her mind if she has underlying heart disease we realized the limitations even of a low risk stress test and athlete and after discussion of options risk and benefits will undergo cardiac CTA.  No palpitations syncope or edema.  Most recent labs: 01/31/2020 CMP glucose 111 potassium 4.3 sodium 142 creatinine 0.8 GFR 75 cc and normal liver function test. Cholesterol 148 LDL 61 triglyceride 86 HDL 79 HDL cholesterol 78 Hemoglobin 14.2 Past Medical History:  Diagnosis Date  . Achilles tendinitis of right lower extremity 06/19/2014   US showed minor changes in December 15  Most sxs seem to be enthesopathy  Note had reaction to NTG in past  Overview:  Overview:  US showed minor changes in December 15  Most sxs seem to be enthesopathy  Note had reaction to NTG in past  Last Assessment & Plan:  Recent irritation in the race appears to be a partial plantaris tear  This continues to give her some Achilles symptoms  I think a period  . ALLERGIC RHINITIS 10/12/2006   Qualifier: Diagnosis of  By: McDiarmid MD, Sherren Mocha    . Ankle pain 08/17/2011  . Asthma 10/12/2006   Qualifier: Diagnosis of  By: McDiarmid MD, Sherren Mocha    Overview:  Overview:  Qualifier: Diagnosis of  By: McDiarmid MD, Sherren Mocha   . CARCINOMA, BASAL CELL 02/07/2008  Qualifier: History of  By: Drue Flirt  MD, Merrily Brittle    Overview:  Overview:  Qualifier: History of  By: Drue Flirt  MD, Merrily Brittle   Overview:  Qualifier: History of  By: Drue Flirt  MD, Merrily Brittle   . Chest pain in adult 08/17/2011   This has worrisome features particularly with her level of exertion   . HAMMER TOE, OTHER, ACQUIRED 10/13/2006   Qualifier: Diagnosis of  By: McDiarmid MD, Sherren Mocha    Overview:  Overview:  Qualifier: Diagnosis of  By: McDiarmid MD, Sherren Mocha   . High cholesterol   . Hyperlipidemia LDL goal <130 10/24/2013  . Injury of plantaris muscle or tendon 10/30/2014  . Insomnia 11/13/2013  . Knee pain, left 06/15/2017  . LEG PAIN, RIGHT 09/22/2009   Qualifier: Diagnosis of  By:  Oneida Alar MD, KARL    . Malignant neoplasm of female breast ([place]) 02/07/2008   Qualifier: History of  By: Drue Flirt  MD, Merrily Brittle    Overview:  Overview:  Qualifier: History of  By: Drue Flirt  MD, Merrily Brittle  Overview:  Overview:  Qualifier: History of  By: Drue Flirt  MD, Merrily Brittle   . Metatarsal stress fracture 08/29/2012   This is at the distal shaft of the fifth metatarsal left foot   . Mild intermittent asthma without complication 4/59/9774  . Muscle tear 11/15/2012   Left rectus femoris; also like tendon tear   . Numbness and tingling of foot 03/29/2012  . Patellar tendinitis 03/07/2013   I suspect this occurred because she did less crosstraining before this marathon and was probably hamstring dominant   . Right foot pain 04/06/2016  . Right knee pain 04/23/2013   04/23/13  ultrasound today RT knee  revealed no significant effusion The quadriceps tendon is intact as is the patellar tendon The medial meniscus was normal The lateral meniscus had an area of calcification and a very small area of splitting along the midline to the posterior third of the joint line   . Rotator cuff tendinitis, left 09/08/2016   Overview:  Last Assessment & Plan:  Significant improvement  See OV summary  . Sensorineural hearing loss 11/13/2013  . Stress fracture of pelvis 08/29/2007   Qualifier: History of  By: Drue Flirt  MD, Merrily Brittle    . STRESS FRACTURE, TIBIA 06/15/2009   Qualifier: Diagnosis of  By: Drue Flirt  MD, Merrily Brittle    . Thrombocytopenia ([place]) 01/30/2014  . Tibialis tendinitis 08/12/2009   Qualifier: Diagnosis of  By: Oneida Alar MD, KARL    . Tinnitus 11/13/2013  . TROCHANTERIC BURSITIS, LEFT 04/27/2010   Qualifier: Diagnosis of  By: Ernestina Patches MD, Remo Lipps    . UNEQUAL LEG LENGTH 09/18/2008   Qualifier: Diagnosis of  By: Oneida Alar MD, KARL      Past Surgical History:  Procedure Laterality Date  . ABDOMINAL SURGERY    . ABDOMINAL SURGERY     for removal of abdominal mass-cancerous  . KNEE SURGERY    . MANDIBLE FRACTURE SURGERY      . NASAL SINUS SURGERY    . TONSILLECTOMY      Current Medications: Current Meds  Medication Sig  . albuterol (PROVENTIL HFA) 108 (90 Base) MCG/ACT inhaler Inhale 2 puffs into the lungs every 6 (six) hours as needed for wheezing.  Marland Kitchen omeprazole (PRILOSEC) 20 MG capsule Take 1 capsule by mouth as needed.  . rosuvastatin (CRESTOR) 10 MG tablet Take by mouth.     Allergies:   Acetaminophen, Aspirin, Nsaids, and Pravastatin   Social History   Socioeconomic History  .  Marital status: Married    Spouse name: Not on file  . Number of children: Not on file  . Years of education: Not on file  . Highest education level: Not on file  Occupational History  . Not on file  Tobacco Use  . Smoking status: Never Smoker  . Smokeless tobacco: Never Used  Vaping Use  . Vaping Use: Never used  Substance and Sexual Activity  . Alcohol use: Yes    Comment: occ glass of wine  . Drug use: No  . Sexual activity: Not on file  Other Topics Concern  . Not on file  Social History Narrative  . Not on file   Social Determinants of Health   Financial Resource Strain:   . Difficulty of Paying Living Expenses: Not on file  Food Insecurity:   . Worried About Charity fundraiser in the Last Year: Not on file  . Ran Out of Food in the Last Year: Not on file  Transportation Needs:   . Lack of Transportation (Medical): Not on file  . Lack of Transportation (Non-Medical): Not on file  Physical Activity:   . Days of Exercise per Week: Not on file  . Minutes of Exercise per Session: Not on file  Stress:   . Feeling of Stress : Not on file  Social Connections:   . Frequency of Communication with Friends and Family: Not on file  . Frequency of Social Gatherings with Friends and Family: Not on file  . Attends Religious Services: Not on file  . Active Member of Clubs or Organizations: Not on file  . Attends Archivist Meetings: Not on file  . Marital Status: Not on file     Family  History: The patient's family history includes COPD in her mother; Diabetes in her father and sister; Parkinson's disease in her father; Pulmonary fibrosis in her father; Thyroid disease in her sister. There is no history of Heart attack. ROS:   Please see the history of present illness.    All other systems reviewed and are negative.  EKGs/Labs/Other Studies Reviewed:    The following studies were reviewed today:  EKG:  EKG ordered today and personally reviewed.  The ekg ordered today demonstrates sinus rhythm and is normal  Recent Labs: No results found for requested labs within last 8760 hours.  Recent Lipid Panel No results found for: CHOL, TRIG, HDL, CHOLHDL, VLDL, LDLCALC, LDLDIRECT  Physical Exam:    VS:  BP 124/70   Pulse 61   Ht 5' (1.524 m)   Wt 136 lb (61.7 kg)   SpO2 97%   BMI 26.56 kg/m     Wt Readings from Last 3 Encounters:  03/27/20 136 lb (61.7 kg)  12/02/19 140 lb (63.5 kg)  11/04/19 138 lb 12.8 oz (63 kg)     GEN:  Well nourished, well developed in no acute distress HEENT: Normal NECK: No JVD; No carotid bruits LYMPHATICS: No lymphadenopathy CARDIAC: RRR, no murmurs, rubs, gallops RESPIRATORY:  Clear to auscultation without rales, wheezing or rhonchi  ABDOMEN: Soft, non-tender, non-distended MUSCULOSKELETAL:  No edema; No deformity  SKIN: Warm and dry NEUROLOGIC:  Alert and oriented x 3 PSYCHIATRIC:  Normal affect    Signed, Shirlee More, MD  03/27/2020 8:47 AM    [place]

## 2020-03-27 ENCOUNTER — Ambulatory Visit: Payer: 59 | Admitting: Cardiology

## 2020-03-27 ENCOUNTER — Other Ambulatory Visit: Payer: Self-pay | Admitting: Cardiology

## 2020-03-27 ENCOUNTER — Other Ambulatory Visit: Payer: Self-pay

## 2020-03-27 VITALS — BP 124/70 | HR 61 | Ht 60.0 in | Wt 136.0 lb

## 2020-03-27 DIAGNOSIS — R079 Chest pain, unspecified: Secondary | ICD-10-CM | POA: Diagnosis not present

## 2020-03-27 DIAGNOSIS — E785 Hyperlipidemia, unspecified: Secondary | ICD-10-CM

## 2020-03-27 MED ORDER — METOPROLOL TARTRATE 100 MG PO TABS: 100.0000 mg | ORAL_TABLET | Freq: Once | ORAL | 0 refills | Status: DC

## 2020-03-27 MED FILL — METOPROLOL TARTRATE 100 MG: 100 | 1 days supply | Qty: 1 | Fill #0

## 2020-03-27 NOTE — Patient Instructions (Signed)
Medication Instructions:  Your physician recommends that you continue on your current medications as directed. Please refer to the Current Medication list given to you today.  *If you need a refill on your cardiac medications before your next appointment, please call your pharmacy*   Lab Work: Your physician recommends that you return for lab work in: Within one week of your cardiac CT BMP If you have labs (blood work) drawn today and your tests are completely normal, you will receive your results only by: Marland Kitchen MyChart Message (if you have MyChart) OR . A paper copy in the mail If you have any lab test that is abnormal or we need to change your treatment, we will call you to review the results.   Testing/Procedures: Your cardiac CT will be scheduled at the below location:   [facility] [address] [place], [place] [address] [phone]  If scheduled at [facility], please arrive at the [facility] main entrance of [facility] 30 minutes prior to test start time. Proceed to the [facility] Radiology Department (first floor) to check-in and test prep.  Please follow these instructions carefully (unless otherwise directed):   On the Night Before the Test: . Be sure to Drink plenty of water. . Do not consume any caffeinated/decaffeinated beverages or chocolate 12 hours prior to your test. . Do not take any antihistamines 12 hours prior to your test.  On the Day of the Test: . Drink plenty of water. Do not drink any water within one hour of the test. . Do not eat any food 4 hours prior to the test. . You may take your regular medications prior to the test.  . Take metoprolol (Lopressor) two hours prior to test. . FEMALES- please wear underwire-free bra if available      After the Test: . Drink plenty of water. . After receiving IV contrast, you may experience a mild flushed feeling. This is normal. . On occasion, you may experience a mild rash up  to 24 hours after the test. This is not dangerous. If this occurs, you can take Benadryl 25 mg and increase your fluid intake. . If you experience trouble breathing, this can be serious. If it is severe call 911 IMMEDIATELY. If it is mild, please call our office. . If you take any of these medications: Glipizide/Metformin, Avandament, Glucavance, please do not take 48 hours after completing test unless otherwise instructed.   Once we have confirmed authorization from your insurance company, we will call you to set up a date and time for your test. Based on how quickly your insurance processes prior authorizations requests, please allow up to 4 weeks to be contacted for scheduling your Cardiac CT appointment. Be advised that routine Cardiac CT appointments could be scheduled as many as 8 weeks after your provider has ordered it.  For non-scheduling related questions, please contact the cardiac imaging nurse navigator should you have any questions/concerns: Marchia Bond, Cardiac Imaging Nurse Navigator Burley Saver, Interim Cardiac Imaging Nurse [facility] Direct Office Dial: [phone]   For scheduling needs, including cancellations and rescheduling, please call [place], [phone] (temporary number).      Follow-Up: At [facility], you and your health needs are our priority.  As part of our continuing mission to provide you with exceptional heart care, we have created designated Provider Care Teams.  These Care Teams include your primary Cardiologist (physician) and Advanced Practice Providers (APPs -  Physician Assistants and Nurse Practitioners) who all work together to provide you with the care you need, when you need it.  We recommend signing up for the patient portal called "MyChart".  Sign up information is provided on this After Visit Summary.  MyChart is used to connect with patients for Virtual Visits (Telemedicine).  Patients are able to view  lab/test results, encounter notes, upcoming appointments, etc.  Non-urgent messages can be sent to your provider as well.   To learn more about what you can do with MyChart, go to [email or web address].    Your next appointment:   3 month(s)  The format for your next appointment:   In Person  Provider:   Shirlee More, MD   Other Instructions

## 2020-04-03 MED FILL — OMEPRAZOLE DR 20 MG CAPSULE: 20 | 30 days supply | Qty: 30 | Fill #0

## 2020-04-13 ENCOUNTER — Other Ambulatory Visit (HOSPITAL_COMMUNITY): Payer: Self-pay | Admitting: Obstetrics and Gynecology

## 2020-04-13 MED FILL — CLOBETASOL PROPIONATE 0.05: 0.05 | 30 days supply | Qty: 30 | Fill #0

## 2020-04-15 ENCOUNTER — Other Ambulatory Visit: Payer: Self-pay | Admitting: *Deleted

## 2020-04-15 DIAGNOSIS — R079 Chest pain, unspecified: Secondary | ICD-10-CM | POA: Diagnosis not present

## 2020-04-16 ENCOUNTER — Telehealth: Payer: Self-pay

## 2020-04-16 LAB — BASIC METABOLIC PANEL
BUN/Creatinine Ratio: 13 (ref 12–28)
BUN: 10 mg/dL (ref 8–27)
CO2: 25 mmol/L (ref 20–29)
Calcium: 9.6 mg/dL (ref 8.7–10.3)
Chloride: 102 mmol/L (ref 96–106)
Creatinine, Ser: 0.79 mg/dL (ref 0.57–1.00)
GFR calc Af Amer: 88 mL/min/{1.73_m2} (ref >59)
GFR calc non Af Amer: 77 mL/min/{1.73_m2} (ref >59)
Glucose: 122 mg/dL — ABNORMAL HIGH (ref 65–99)
Potassium: 4.4 mmol/L (ref 3.5–5.2)
Sodium: 142 mmol/L (ref 134–144)

## 2020-04-16 NOTE — Telephone Encounter (Signed)
-----   Message from Richardo Priest, MD sent at 04/16/2020  7:55 AM EST ----- Normal BMP

## 2020-04-16 NOTE — Telephone Encounter (Signed)
Spoke with patient regarding results and recommendation.  Patient verbalizes understanding and is agreeable to plan of care. Advised patient to call back with any issues or concerns.  

## 2020-04-20 ENCOUNTER — Telehealth (HOSPITAL_COMMUNITY): Payer: Self-pay | Admitting: Emergency Medicine

## 2020-04-20 NOTE — Telephone Encounter (Signed)
Reaching out to patient to offer assistance regarding upcoming cardiac imaging study; pt verbalizes understanding of appt date/time, parking situation and where to check in, pre-test NPO status and medications ordered, and verified current allergies; name and call back number provided for further questions should they arise Gay Rape RN Navigator Cardiac Imaging [facility] [phone] office [phone] cell 

## 2020-04-22 ENCOUNTER — Ambulatory Visit (HOSPITAL_COMMUNITY)
Admission: RE | Admit: 2020-04-22 | Discharge: 2020-04-22 | Disposition: A | Discharge status: Home / Self Care | Payer: 59 | Source: Ambulatory Visit | Attending: Cardiology | Admitting: Cardiology

## 2020-04-22 ENCOUNTER — Encounter: Payer: 59 | Admitting: *Deleted

## 2020-04-22 ENCOUNTER — Other Ambulatory Visit: Payer: Self-pay

## 2020-04-22 DIAGNOSIS — I2510 Atherosclerotic heart disease of native coronary artery without angina pectoris: Secondary | ICD-10-CM

## 2020-04-22 DIAGNOSIS — Z006 Encounter for examination for normal comparison and control in clinical research program: Secondary | ICD-10-CM

## 2020-04-22 DIAGNOSIS — R079 Chest pain, unspecified: Secondary | ICD-10-CM | POA: Insufficient documentation

## 2020-04-22 MED ORDER — NITROGLYCERIN 0.4 MG SL SUBL
SUBLINGUAL_TABLET | SUBLINGUAL | Status: AC
  Filled 2020-04-22: qty 2

## 2020-04-22 MED ORDER — IOHEXOL 350 MG/ML SOLN
80.0000 mL | Freq: Once | INTRAVENOUS | Status: AC | PRN
  Administered 2020-04-22: 80 mL via INTRAVENOUS

## 2020-04-22 MED ORDER — NITROGLYCERIN 0.4 MG SL SUBL
0.8000 mg | SUBLINGUAL_TABLET | Freq: Once | SUBLINGUAL | Status: AC
  Administered 2020-04-22: 0.8 mg via SUBLINGUAL

## 2020-04-22 NOTE — Research (Signed)
Subject Name: Rhonda [name]  Subject met inclusion and exclusion criteria.  The informed consent form, study requirements and expectations were reviewed with the subject and questions and concerns were addressed prior to the signing of the consent form.  The subject verbalized understanding of the trial requirements.  The subject agreed to participate in the IDENTIFY trial and signed the informed consent at 0821 on 04/22/20  The informed consent was obtained prior to performance of any protocol-specific procedures for the subject.  A copy of the signed informed consent was given to the subject and a copy was placed in the subject's medical record.   Young, [name]   

## 2020-04-23 NOTE — Progress Notes (Deleted)
Cardiology Office Note:    Date:  04/23/2020   ID:  Rhonda [name], DOB [birth year], MRN [id number]  PCP:  Loraine Leriche., MD  Cardiologist:  Shirlee More, MD    Referring MD: Loraine Leriche.,*    ASSESSMENT:    No diagnosis found. PLAN:    In order of problems listed above:  1. ***   Next appointment: ***   Medication Adjustments/Labs and Tests Ordered: Current medicines are reviewed at length with the patient today.  Concerns regarding medicines are outlined above.  No orders of the defined types were placed in this encounter.  No orders of the defined types were placed in this encounter.   No chief complaint on file.   History of Present Illness:    Rhonda [name] is a 69 y.o. female with a hx of *** last seen ***. Compliance with diet, lifestyle and medications: *** Past Medical History:  Diagnosis Date  . Achilles tendinitis of right lower extremity 06/19/2014   US showed minor changes in December 15  Most sxs seem to be enthesopathy  Note had reaction to NTG in past  Overview:  Overview:  US showed minor changes in December 15  Most sxs seem to be enthesopathy  Note had reaction to NTG in past  Last Assessment & Plan:  Recent irritation in the race appears to be a partial plantaris tear  This continues to give her some Achilles symptoms  I think a period  . ALLERGIC RHINITIS 10/12/2006   Qualifier: Diagnosis of  By: McDiarmid MD, Sherren Mocha    . Ankle pain 08/17/2011  . Asthma 10/12/2006   Qualifier: Diagnosis of  By: McDiarmid MD, Sherren Mocha    Overview:  Overview:  Qualifier: Diagnosis of  By: McDiarmid MD, Sherren Mocha   . CARCINOMA, BASAL CELL 02/07/2008   Qualifier: History of  By: Drue Flirt  MD, Merrily Brittle    Overview:  Overview:  Qualifier: History of  By: Drue Flirt  MD, Merrily Brittle   Overview:  Qualifier: History of  By: Drue Flirt  MD, Merrily Brittle   . Chest pain in adult 08/17/2011   This has worrisome features particularly with her level of exertion   . HAMMER TOE, OTHER,  ACQUIRED 10/13/2006   Qualifier: Diagnosis of  By: McDiarmid MD, Sherren Mocha    Overview:  Overview:  Qualifier: Diagnosis of  By: McDiarmid MD, Sherren Mocha   . High cholesterol   . Hyperlipidemia LDL goal <130 10/24/2013  . Injury of plantaris muscle or tendon 10/30/2014  . Insomnia 11/13/2013  . Knee pain, left 06/15/2017  . LEG PAIN, RIGHT 09/22/2009   Qualifier: Diagnosis of  By: Oneida Alar MD, KARL    . Malignant neoplasm of female breast ([place]) 02/07/2008   Qualifier: History of  By: Drue Flirt  MD, Merrily Brittle    Overview:  Overview:  Qualifier: History of  By: Drue Flirt  MD, Merrily Brittle  Overview:  Overview:  Qualifier: History of  By: Drue Flirt  MD, Merrily Brittle   . Metatarsal stress fracture 08/29/2012   This is at the distal shaft of the fifth metatarsal left foot   . Mild intermittent asthma without complication 0/86/7619  . Muscle tear 11/15/2012   Left rectus femoris; also like tendon tear   . Numbness and tingling of foot 03/29/2012  . Patellar tendinitis 03/07/2013   I suspect this occurred because she did less crosstraining before this marathon and was probably hamstring dominant   . Right foot pain 04/06/2016  . Right knee pain 04/23/2013   04/23/13  ultrasound today RT knee  revealed no significant effusion The quadriceps tendon is intact as is the patellar tendon The medial meniscus was normal The lateral meniscus had an area of calcification and a very small area of splitting along the midline to the posterior third of the joint line   . Rotator cuff tendinitis, left 09/08/2016   Overview:  Last Assessment & Plan:  Significant improvement  See OV summary  . Sensorineural hearing loss 11/13/2013  . Stress fracture of pelvis 08/29/2007   Qualifier: History of  By: Drue Flirt  MD, Merrily Brittle    . STRESS FRACTURE, TIBIA 06/15/2009   Qualifier: Diagnosis of  By: Drue Flirt  MD, Merrily Brittle    . Thrombocytopenia ([place]) 01/30/2014  . Tibialis tendinitis 08/12/2009   Qualifier: Diagnosis of  By: Oneida Alar MD, KARL    . Tinnitus 11/13/2013  .  TROCHANTERIC BURSITIS, LEFT 04/27/2010   Qualifier: Diagnosis of  By: Ernestina Patches MD, Remo Lipps    . UNEQUAL LEG LENGTH 09/18/2008   Qualifier: Diagnosis of  By: Oneida Alar MD, KARL      Past Surgical History:  Procedure Laterality Date  . ABDOMINAL SURGERY    . ABDOMINAL SURGERY     for removal of abdominal mass-cancerous  . KNEE SURGERY    . MANDIBLE FRACTURE SURGERY    . NASAL SINUS SURGERY    . TONSILLECTOMY      Current Medications: No outpatient medications have been marked as taking for the 04/24/20 encounter (Appointment) with Richardo Priest, MD.     Allergies:   Acetaminophen, Aspirin, Nsaids, and Pravastatin   Social History   Socioeconomic History  . Marital status: Married    Spouse name: Not on file  . Number of children: Not on file  . Years of education: Not on file  . Highest education level: Not on file  Occupational History  . Not on file  Tobacco Use  . Smoking status: Never Smoker  . Smokeless tobacco: Never Used  Vaping Use  . Vaping Use: Never used  Substance and Sexual Activity  . Alcohol use: Yes    Comment: occ glass of wine  . Drug use: No  . Sexual activity: Not on file  Other Topics Concern  . Not on file  Social History Narrative  . Not on file   Social Determinants of Health   Financial Resource Strain: Not on file  Food Insecurity: Not on file  Transportation Needs: Not on file  Physical Activity: Not on file  Stress: Not on file  Social Connections: Not on file     Family History: The patient's ***family history includes COPD in her mother; Diabetes in her father and sister; Parkinson's disease in her father; Pulmonary fibrosis in her father; Thyroid disease in her sister. There is no history of Heart attack. ROS:   Please see the history of present illness.    All other systems reviewed and are negative.  EKGs/Labs/Other Studies Reviewed:    The following studies were reviewed today:  EKG:  EKG ordered today and personally  reviewed.  The ekg ordered today demonstrates ***  Recent Labs: 04/15/2020: BUN 10; Creatinine, Ser 0.79; Potassium 4.4; Sodium 142  Recent Lipid Panel No results found for: CHOL, TRIG, HDL, CHOLHDL, VLDL, LDLCALC, LDLDIRECT  Physical Exam:    VS:  There were no vitals taken for this visit.    Wt Readings from Last 3 Encounters:  03/27/20 136 lb (61.7 kg)  12/02/19 140 lb (63.5 kg)  11/04/19 138 lb  12.8 oz (63 kg)     GEN: *** Well nourished, well developed in no acute distress HEENT: Normal NECK: No JVD; No carotid bruits LYMPHATICS: No lymphadenopathy CARDIAC: ***RRR, no murmurs, rubs, gallops RESPIRATORY:  Clear to auscultation without rales, wheezing or rhonchi  ABDOMEN: Soft, non-tender, non-distended MUSCULOSKELETAL:  No edema; No deformity  SKIN: Warm and dry NEUROLOGIC:  Alert and oriented x 3 PSYCHIATRIC:  Normal affect    Signed, Shirlee More, MD  04/23/2020 5:14 PM    [facility]

## 2020-04-24 ENCOUNTER — Ambulatory Visit: Payer: 59 | Admitting: Cardiology

## 2020-04-25 ENCOUNTER — Ambulatory Visit (HOSPITAL_COMMUNITY)
Admission: RE | Admit: 2020-04-25 | Discharge: 2020-04-25 | Disposition: A | Discharge status: Home / Self Care | Payer: 59 | Source: Ambulatory Visit | Attending: Cardiology | Admitting: Cardiology

## 2020-04-25 DIAGNOSIS — R079 Chest pain, unspecified: Secondary | ICD-10-CM | POA: Diagnosis not present

## 2020-04-27 ENCOUNTER — Telehealth: Payer: Self-pay | Admitting: Cardiology

## 2020-04-27 DIAGNOSIS — I2510 Atherosclerotic heart disease of native coronary artery without angina pectoris: Secondary | ICD-10-CM | POA: Diagnosis not present

## 2020-04-27 NOTE — Telephone Encounter (Signed)
New Message  Pt says she is returning a missed call she had this morning  No documentation for who called   Please advise

## 2020-04-27 NOTE — Telephone Encounter (Signed)
VM left for pt to callback.  Message from Dr. Bettina Gavia,  I tried twice Friday twice Saturday and once today to call her and I do not get an answer.  The fractional flow reserve is normal she does not have a severe blockage.  She has a moderate stenosis in the left anterior descending coronary artery  I think she would be improved if I put her on a low-dose of a calcium channel blocker Cardizem CD 180 mg daily and I do not think it would interfere with her exercise routine and she can get back to training.  Can review further at office follow-up which was rescheduled as a report was not back.

## 2020-04-28 NOTE — Progress Notes (Signed)
Cardiology Office Note:    Date:  04/30/2020   ID:  Rhonda [name], DOB 20-Sep-1950, MRN [id number]  PCP:  Loraine Leriche., MD  Cardiologist:  Shirlee More, MD    Referring MD: Loraine Leriche.,*    ASSESSMENT:    1. Coronary artery disease of native artery of native heart with stable angina pectoris ([place])   2. Mixed hyperlipidemia   3. Mild intermittent asthma without complication   4. Gastroesophageal reflux disease without esophagitis    PLAN:    In order of problems listed above:   She has moderate nonflow limiting stenosis in her LAD contributing to her symptoms of asthma shortness of breath chest pain.  Will avoid beta-blocker placed on a rate limiting calcium channel blocker I told her she can restart her exercise program from my perspective and she is intolerant of aspirin I do not feel the need to put her on clopidogrel especially with worsening of her reflux.  At her request I have increased her PPI we will continue her statin I will see her back in office in 3 months.    Next appointment: 3 months already scheduled   Medication Adjustments/Labs and Tests Ordered: Current medicines are reviewed at length with the patient today.  Concerns regarding medicines are outlined above.  No orders of the defined types were placed in this encounter.  No orders of the defined types were placed in this encounter.   Chief Complaint  Patient presents with  . Follow scan    History of Present Illness:    Rhonda [name] is a 69 y.o. female with a hx of exercise-induced asthma chest pain previous stress echocardiogram 03/07/2018 that was normal with a high exercise tolerance of 13.4 METS.  She was last seen 03/27/2020 with ongoing chest pain and made a decision to pursue coronary CTA.Marland Kitchen  Compliance with diet, lifestyle and medications: Yes  Reviewed her cardiac CTA she will continue her intensity statin with a mid range calcium score and has localized  moderate nonflow limiting stenosis LAD.  I told her that she may well have an element of coronary vasoconstriction coronary microvascular disease and with her asthma we will put her on a low-dose of a calcium channel blocker I think you optimize her symptoms.  She continues to have exertional shortness of breath and chest pain and also has asthma and has had a treatment intensified.  Coronary CTA is reported out 04/24/2020 her calcium score was 54 58th percentile for age age and sex.  She had a moderate stenosis in the LAD at the origin of the first diagonal branch 50 to 69%.  There are other areas of 1 to 24% stenosis in the right coronary artery mid LAD.  Her thoracic aorta and pulmonary artery are normal in caliber.  Subsequent FFR was normal. Past Medical History:  Diagnosis Date  . Achilles tendinitis of right lower extremity 06/19/2014   US showed minor changes in December 15  Most sxs seem to be enthesopathy  Note had reaction to NTG in past  Overview:  Overview:  US showed minor changes in December 15  Most sxs seem to be enthesopathy  Note had reaction to NTG in past  Last Assessment & Plan:  Recent irritation in the race appears to be a partial plantaris tear  This continues to give her some Achilles symptoms  I think a period  . ALLERGIC RHINITIS 10/12/2006   Qualifier: Diagnosis of  By: McDiarmid MD, Sherren Mocha    .  Allergic rhinitis 10/12/2006   Overview:  Overview:  Qualifier: Diagnosis of  By: McDiarmid MD, Cyndie Chime of this note might be different from the original. Overview:  Qualifier: Diagnosis of  By: McDiarmid MD, Sherren Mocha  . Ankle pain 08/17/2011  . Asthma 10/12/2006   Qualifier: Diagnosis of  By: McDiarmid MD, Sherren Mocha    Overview:  Overview:  Qualifier: Diagnosis of  By: McDiarmid MD, Sherren Mocha   . Benign paroxysmal positional vertigo 11/13/2013  . CARCINOMA, BASAL CELL 02/07/2008   Qualifier: History of  By: Drue Flirt  MD, Merrily Brittle    Overview:  Overview:  Qualifier: History of  By: Drue Flirt  MD,  Merrily Brittle   Overview:  Qualifier: History of  By: Drue Flirt  MD, Merrily Brittle   . Chest pain in adult 08/17/2011   This has worrisome features particularly with her level of exertion   . Exercise-induced asthma 05/24/2018  . First degree AV block 03/21/2018  . Gastroesophageal reflux disease without esophagitis 07/23/2018  . HAMMER TOE, OTHER, ACQUIRED 10/13/2006   Qualifier: Diagnosis of  By: McDiarmid MD, Sherren Mocha    Overview:  Overview:  Qualifier: Diagnosis of  By: McDiarmid MD, Sherren Mocha   . High cholesterol   . Hyperlipidemia   . Hyperlipidemia LDL goal <130 10/24/2013  . Injury of plantaris muscle or tendon 10/30/2014  . Insomnia 11/13/2013  . Knee pain, left 06/15/2017  . Left hip pain 11/04/2019  . LEG PAIN, RIGHT 09/22/2009   Qualifier: Diagnosis of  By: Oneida Alar MD, KARL    . Lichen sclerosus of female genitalia 07/24/2019  . Malignant neoplasm of female breast ([place]) 02/07/2008   Qualifier: History of  By: Drue Flirt  MD, Merrily Brittle    Overview:  Overview:  Qualifier: History of  By: Drue Flirt  MD, Merrily Brittle  Overview:  Overview:  Qualifier: History of  By: Drue Flirt  MD, Merrily Brittle   . Metatarsal stress fracture 08/29/2012   This is at the distal shaft of the fifth metatarsal left foot   . Mild intermittent asthma without complication 2/29/7989  . Muscle tear 11/15/2012   Left rectus femoris; also like tendon tear   . Numbness and tingling of foot 03/29/2012  . Patellar tendinitis 03/07/2013   I suspect this occurred because she did less crosstraining before this marathon and was probably hamstring dominant   . Right foot pain 04/06/2016  . Right knee pain 04/23/2013   04/23/13  ultrasound today RT knee  revealed no significant effusion The quadriceps tendon is intact as is the patellar tendon The medial meniscus was normal The lateral meniscus had an area of calcification and a very small area of splitting along the midline to the posterior third of the joint line   . Rotator cuff tendinitis, left 09/08/2016    Overview:  Last Assessment & Plan:  Significant improvement  See OV summary  . Sensorineural hearing loss 11/13/2013  . Stress fracture of pelvis 08/29/2007   Qualifier: History of  By: Drue Flirt  MD, Merrily Brittle    . STRESS FRACTURE, TIBIA 06/15/2009   Qualifier: Diagnosis of  By: Drue Flirt  MD, Merrily Brittle    . Thrombocytopenia ([place]) 01/30/2014  . Tibialis tendinitis 08/12/2009   Qualifier: Diagnosis of  By: Oneida Alar MD, KARL    . Tinnitus 11/13/2013  . TROCHANTERIC BURSITIS, LEFT 04/27/2010   Qualifier: Diagnosis of  By: Ernestina Patches MD, Remo Lipps    . UNEQUAL LEG LENGTH 09/18/2008   Qualifier: Diagnosis of  By: Oneida Alar MD, KARL    . Wrist pain, acute, left  04/23/2019   US shows small fracture off tip of lister's tubercle Extensor comp 4 strain    Past Surgical History:  Procedure Laterality Date  . ABDOMINAL SURGERY    . ABDOMINAL SURGERY     for removal of abdominal mass-cancerous  . KNEE SURGERY    . MANDIBLE FRACTURE SURGERY    . NASAL SINUS SURGERY    . TONSILLECTOMY      Current Medications: Current Meds  Medication Sig  . albuterol (PROVENTIL HFA) 108 (90 Base) MCG/ACT inhaler Inhale 2 puffs into the lungs every 6 (six) hours as needed for wheezing.  . clobetasol ointment (TEMOVATE) 0.03 % Apply 1 application topically daily as needed.  . loratadine-pseudoephedrine (CLARITIN-D 24-HOUR) 10-240 MG 24 hr tablet Take 1 tablet by mouth daily.  Marland Kitchen omeprazole (PRILOSEC) 20 MG capsule Take 1 capsule by mouth as needed.  . rosuvastatin (CRESTOR) 10 MG tablet Take 10 mg by mouth daily.     Allergies:   Acetaminophen, Aspirin, Nsaids, and Pravastatin   Social History   Socioeconomic History  . Marital status: Married    Spouse name: Not on file  . Number of children: Not on file  . Years of education: Not on file  . Highest education level: Not on file  Occupational History  . Not on file  Tobacco Use  . Smoking status: Never Smoker  . Smokeless tobacco: Never Used  Vaping Use  . Vaping Use:  Never used  Substance and Sexual Activity  . Alcohol use: Yes    Comment: occ glass of wine  . Drug use: No  . Sexual activity: Not on file  Other Topics Concern  . Not on file  Social History Narrative  . Not on file   Social Determinants of Health   Financial Resource Strain: Not on file  Food Insecurity: Not on file  Transportation Needs: Not on file  Physical Activity: Not on file  Stress: Not on file  Social Connections: Not on file     Family History: The patient's family history includes COPD in her mother; Diabetes in her father and sister; Parkinson's disease in her father; Pulmonary fibrosis in her father; Thyroid disease in her sister. There is no history of Heart attack. ROS:   Please see the history of present illness.    All other systems reviewed and are negative.  EKGs/Labs/Other Studies Reviewed:    The following studies were reviewed today:   Recent Labs: 04/15/2020: BUN 10; Creatinine, Ser 0.79; Potassium 4.4; Sodium 142  Recent Lipid Panel No results found for: CHOL, TRIG, HDL, CHOLHDL, VLDL, LDLCALC, LDLDIRECT  Physical Exam:    VS:  BP 132/80   Pulse 96   Ht 5' (1.524 m)   Wt 140 lb (63.5 kg)   SpO2 100%   BMI 27.34 kg/m     Wt Readings from Last 3 Encounters:  04/30/20 140 lb (63.5 kg)  04/29/20 136 lb 3.2 oz (61.8 kg)  03/27/20 136 lb (61.7 kg)     GEN:  Well nourished, well developed in no acute distress HEENT: Normal NECK: No JVD; No carotid bruits LYMPHATICS: No lymphadenopathy CARDIAC: RRR, no murmurs, rubs, gallops RESPIRATORY:  Clear to auscultation without rales, wheezing or rhonchi  ABDOMEN: Soft, non-tender, non-distended MUSCULOSKELETAL:  No edema; No deformity  SKIN: Warm and dry NEUROLOGIC:  Alert and oriented x 3 PSYCHIATRIC:  Normal affect    Signed, Shirlee More, MD  04/30/2020 2:21 PM    [facility]

## 2020-04-29 ENCOUNTER — Encounter: Payer: Self-pay | Admitting: Emergency Medicine

## 2020-04-29 ENCOUNTER — Ambulatory Visit: Payer: 59 | Admitting: Emergency Medicine

## 2020-04-29 ENCOUNTER — Other Ambulatory Visit: Payer: Self-pay

## 2020-04-29 DIAGNOSIS — K219 Gastro-esophageal reflux disease without esophagitis: Secondary | ICD-10-CM | POA: Diagnosis not present

## 2020-04-29 DIAGNOSIS — J301 Allergic rhinitis due to pollen: Secondary | ICD-10-CM | POA: Diagnosis not present

## 2020-04-29 DIAGNOSIS — J4520 Mild intermittent asthma, uncomplicated: Secondary | ICD-10-CM

## 2020-04-29 MED FILL — OMEPRAZOLE DR 20 MG CAPSULE: 20 | 90 days supply | Qty: 90 | Fill #1

## 2020-04-29 NOTE — Progress Notes (Signed)
Subjective:    Patient ID: Rhonda [name], female    DOB: August 04, 1950, 69 y.o.   MRN: [id number]  Asthma There is no cough, shortness of breath or wheezing. Pertinent negatives include no ear pain, fever, headaches, postnasal drip, rhinorrhea, sneezing, sore throat or trouble swallowing. Her past medical history is significant for asthma.     ROV 05/07/2019 --follow-up visit for pleasant 69 year old woman with a history of mild asthma and chronic upper airway irritation with cough.  There is an exercise-induced component to her airflow obstruction and she has used albuterol successfully prior to exercise.  She has been treated for GERD with Prilosec which she uses prn, not currently on any rhinitis therapy.  She has albuterol which she has not required at all recently. She had started training to run again but had trouble doing so due to L broken wrist.   ROV 11/01/19 --69 year old woman who follows today for mild intermittent asthma and chronic upper irritation with cough.  She does have some evidence for exertional bronchospasm and has benefited from using albuterol before exercise.  She has had a very stressful few days due to family events, has noticed some chest tightness, discomfort especially when she is anxious or upset. Overall she is doing well - hasn't used any albuterol since last time. Her exercise is still limited by her hip injury - she is completing physical therapy. Still using omeprazole prn - rare use.   ROV 02/28/2020 --follow-up visit for 69 year old woman with chronic upper airway irritation with associated cough, mild intermittent asthma with a positive bronchodilator response on pulmonary function testing from 10/03/2017.  She typically uses albuterol prior to exercise. Has albuterol >> using seldomly because she has not been exercising as much. Her hip is improving and she has been cleared to run, but her cardio isn't rebuilding like she would expect. She has had some  exertional CP and dyspnea. She has seen some relief when she pre-treats exercise w albuterol. She is undergoing cardiac risk stratification with Dr Dulce Sellar.  She reports developing mid to upper chest and throat pressure, can happen at any time. She has a daily cough, non productive. Can be relieved w albuterol. Uses omeprazole but only prn. Lots of nasal gtt and drainage - on no meds she uses flonase prn, rarely.   Rare omeprazole  use    Review of Systems  Constitutional: Negative for fever and unexpected weight change.  HENT: Negative for congestion, dental problem, ear pain, nosebleeds, postnasal drip, rhinorrhea, sinus pressure, sneezing, sore throat and trouble swallowing.   Eyes: Negative for redness and itching.  Respiratory: Positive for chest tightness. Negative for cough, shortness of breath and wheezing.   Cardiovascular: Negative for palpitations and leg swelling.  Gastrointestinal: Negative for nausea and vomiting.  Genitourinary: Negative for dysuria.  Musculoskeletal: Negative for joint swelling.  Skin: Negative for rash.  Neurological: Negative for headaches.  Hematological: Does not bruise/bleed easily.  Psychiatric/Behavioral: Negative for dysphoric mood. The patient is not nervous/anxious.        Objective:   Physical Exam Vitals:   04/29/20 0914  BP: 122/78  Pulse: 67  SpO2: 98%  Weight: 136 lb 3.2 oz (61.8 kg)  Height: 5' (1.524 m)    Gen: Pleasant, well-nourished, in no distress,  normal affect  ENT: No lesions,  mouth clear,  oropharynx clear, no postnasal drip  Neck: No JVD, no stridor  Lungs: No use of accessory muscles, no dullness to percussion, clear without rales or rhonchi  Cardiovascular: RRR, heart sounds normal, no murmur or gallops, no peripheral edema  Musculoskeletal: No deformities, no cyanosis or clubbing  Neuro: alert, non focal  Skin: Warm, no lesions or rash      Assessment & Plan:  Mild intermittent asthma without  complication Symptoms are a bit more frequent, can be noticed even daily.  She is not using albuterol with every occurrence.  I believe that she has both GERD and allergic rhinitis that are contributing to poor control.  We will try to treat both.  Keep your albuterol available and keep track of frequency of use as we start GERD and allergy regimens.  Allergic rhinitis Restart loratadine.  If not completely effective than would recommend also adding fluticasone nasal spray.  Discussed with her today.  GERD (gastroesophageal reflux disease) Has been using as needed but is having some chest symptoms and more labile asthma symptoms.  We will do a trial of PPI every day to see if there is any improvement.  Baltazar Apo, MD, PhD 04/29/2020, 9:43 AM [facility] [phone] or if no answer [phone]

## 2020-04-29 NOTE — Assessment & Plan Note (Signed)
Restart loratadine.  If not completely effective than would recommend also adding fluticasone nasal spray.  Discussed with her today.

## 2020-04-29 NOTE — Assessment & Plan Note (Signed)
Has been using as needed but is having some chest symptoms and more labile asthma symptoms.  We will do a trial of PPI every day to see if there is any improvement.

## 2020-04-29 NOTE — Patient Instructions (Signed)
Please start taking your omeprazole 20 mg once daily every day.  Take this 1 hour around food. Start taking loratadine (Claritin) 10 mg once daily. You may benefit from fluticasone nasal spray (Flonase) 2 sprays each nostril once daily.  If you continue to have drainage and congestion after starting the loratadine then try taking the fluticasone on a daily schedule. Keep your albuterol available use 2 puffs when needed for shortness of breath, chest tightness, wheezing.  Take 2 puffs prior to exercise Follow with Dr. Bettina Gavia as planned. Follow with Dr Lamonte Sakai in 3 months or sooner if you have any problems.

## 2020-04-29 NOTE — Assessment & Plan Note (Signed)
Symptoms are a bit more frequent, can be noticed even daily.  She is not using albuterol with every occurrence.  I believe that she has both GERD and allergic rhinitis that are contributing to poor control.  We will try to treat both.  Keep your albuterol available and keep track of frequency of use as we start GERD and allergy regimens.

## 2020-04-29 NOTE — Telephone Encounter (Signed)
LVMTCB and message was sent in MyChart for recent missed calls.

## 2020-04-30 ENCOUNTER — Other Ambulatory Visit: Payer: Self-pay | Admitting: Cardiology

## 2020-04-30 ENCOUNTER — Encounter: Payer: Self-pay | Admitting: Cardiology

## 2020-04-30 ENCOUNTER — Ambulatory Visit: Payer: 59 | Admitting: Cardiology

## 2020-04-30 VITALS — BP 132/80 | HR 96 | Ht 60.0 in | Wt 140.0 lb

## 2020-04-30 DIAGNOSIS — I25118 Atherosclerotic heart disease of native coronary artery with other forms of angina pectoris: Secondary | ICD-10-CM | POA: Diagnosis not present

## 2020-04-30 DIAGNOSIS — J4520 Mild intermittent asthma, uncomplicated: Secondary | ICD-10-CM

## 2020-04-30 DIAGNOSIS — E782 Mixed hyperlipidemia: Secondary | ICD-10-CM | POA: Diagnosis not present

## 2020-04-30 DIAGNOSIS — K219 Gastro-esophageal reflux disease without esophagitis: Secondary | ICD-10-CM | POA: Diagnosis not present

## 2020-04-30 MED ORDER — DILTIAZEM HCL ER COATED BEADS 180 MG PO CP24: 180.0000 mg | ORAL_CAPSULE | Freq: Every day | ORAL | 2 refills | Status: DC

## 2020-04-30 MED ORDER — OMEPRAZOLE 20 MG PO CPDR: 20.0000 mg | DELAYED_RELEASE_CAPSULE | Freq: Two times a day (BID) | ORAL | 3 refills | Status: DC

## 2020-04-30 MED FILL — CARTIA XT 180 MG CAPSULE SA: 180 | 30 days supply | Qty: 30 | Fill #0

## 2020-04-30 NOTE — Patient Instructions (Signed)
Medication Instructions:  Prilosec 20 mg 2 times daily Cardizem CD 180 mg daily Omeprazole tablets (OTC) What is this medicine? OMEPRAZOLE (oh ME pray zol) prevents the production of acid in the stomach. It is used to treat the symptoms of heartburn. You can buy this medicine without a prescription. This product is not for long-term use, unless otherwise directed by your doctor or health care professional. This medicine may be used for other purposes; ask your health care provider or pharmacist if you have questions. COMMON BRAND NAME(S): Prilosec OTC What should I tell my health care provider before I take this medicine? They need to know if you have any of these conditions:  black or bloody stools  chest pain  difficulty swallowing  have had heartburn for over 3 months  have heartburn with dizziness, lightheadedness or sweating  liver disease  lupus  stomach pain  unexplained weight loss  vomiting with blood  wheezing  an unusual or allergic reaction to omeprazole, other medicines, foods, dyes, or preservatives  pregnant or trying to get pregnant  breast-feeding How should I use this medicine? Take this medicine by mouth with a glass of water. Follow the directions on the product label. Do not cut, crush or chew this medicine. Swallow the tablets whole. Take this medicine on an empty stomach, at least 30 minutes before breakfast. Take your medicine at regular intervals. Do not take it more often than directed. Talk to your pediatrician regarding the use of this medicine in children. Special care may be needed. Overdosage: If you think you have taken too much of this medicine contact a poison control center or emergency room at once. NOTE: This medicine is only for you. Do not share this medicine with others. What if I miss a dose? If you miss a dose, take it as soon as you can. If it is almost time for your next dose, take only that dose. Do not take double or extra  doses. What may interact with this medicine? Do not take this medicine with any of the following medications:  atazanavir  clopidogrel  nelfinavir  rilpivirine This medicine may also interact with the following medications:  antifungals like itraconazole, ketoconazole, and voriconazole  certain antivirals for HIV or hepatitis  certain medicines that treat or prevent blood clots like warfarin  cilostazol  citalopram  cyclosporine  dasatinib  digoxin  disulfiram  diuretics  erlotinib  iron supplements  medicines for anxiety, panic, and sleep like diazepam  medicines for seizures like carbamazepine, phenobarbital, phenytoin  methotrexate  mycophenolate mofetil  nilotinib  rifampin  St. John's wort  tacrolimus  vitamin B12 This list may not describe all possible interactions. Give your health care provider a list of all the medicines, herbs, non-prescription drugs, or dietary supplements you use. Also tell them if you smoke, drink alcohol, or use illegal drugs. Some items may interact with your medicine. What should I watch for while using this medicine? It can take several days before your heartburn gets better. Tell your healthcare professional if your symptoms do not start to get better or if they get worse. If you need to take this medicine for more than 14 days, talk to your healthcare professional. Heartburn may sometimes be caused by a more serious condition. This medicine may cause a decrease in vitamin B12. You should make sure that you get enough vitamin B12 while you are taking this medicine. Discuss the foods you eat and the vitamins you take with your health care  professional. What side effects may I notice from receiving this medicine? Side effects that you should report to your doctor or health care professional as soon as possible:  allergic reactions like skin rash, itching or hives, swelling of the face, lips, or tongue  bone  pain  breathing problems  fever or sore throat  joint pain  rash on cheeks or arms that gets worse in the sun  redness, blistering, peeling, or loosening of the skin, including inside the mouth  severe diarrhea  signs and symptoms of kidney injury like trouble passing urine or change in the amount of urine  signs and symptoms of low magnesium like muscle cramps; muscle pain; muscle weakness; tremors; seizures; or fast, irregular heartbeat  stomach polyps  unusual bleeding or bruising Side effects that usually do not require medical attention (report to your doctor or health care professional if they continue or are bothersome):  diarrhea  dry mouth  gas  headache  nausea  stomach pain This list may not describe all possible side effects. Call your doctor for medical advice about side effects. You may report side effects to FDA at 1-800-FDA-1088. Where should I keep my medicine? Keep out of the reach of children. Store at room temperature between 20 and 25 degrees C (68 and 77 degrees F). Protect from light and moisture. Throw away any unused medicine after the expiration date. NOTE: This sheet is a summary. It may not cover all possible information. If you have questions about this medicine, talk to your doctor, pharmacist, or health care provider.  2020 Elsevier/Gold Standard (2018-02-14 13:06:30)  Diltiazem Extended-Release Oral Capsules or Tablets What is this medicine? DILTIAZEM (dil TYE a zem) is a calcium channel blocker. It relaxes your blood vessels and decreases the amount of work the heart has to do. It treats high blood pressure and/or prevents chest pain (also called angina). This medicine may be used for other purposes; ask your health care provider or pharmacist if you have questions. COMMON BRAND NAME(S): Cardizem CD, Cardizem LA, Cardizem SR, Cartia XT, Dilacor XR, Dilt-CD, Diltia XT, Diltzac, Matzim LA, Rema Fendt, TIADYLT ER, Tiamate, Tiazac What should  I tell my health care provider before I take this medicine? They need to know if you have any of these conditions:  heart attack  heart disease  irregular heartbeat or rhythm  low blood pressure  an unusual or allergic reaction to diltiazem, other drugs, foods, dyes, or preservatives  pregnant or trying to get pregnant  breast-feeding How should I use this medicine? Take this drug by mouth. Take it as directed on the prescription label at the same time every day. Do not cut, crush or chew this drug. Swallow the capsules whole. You can take it with or without food. If it upsets your stomach, take it with food. Keep taking it unless your health care provider tells you to stop. Talk to your health care provider about the use of this drug in children. Special care may be needed. Overdosage: If you think you have taken too much of this medicine contact a poison control center or emergency room at once. NOTE: This medicine is only for you. Do not share this medicine with others. What if I miss a dose? If you miss a dose, take it as soon as you can. If it is almost time for your next dose, take only that dose. Do not take double or extra doses. What may interact with this medicine? Do not take this medicine  with any of the following medications:  cisapride  hawthorn  pimozide  ranolazine  red yeast rice This medicine may also interact with the following medications:  buspirone  carbamazepine  cimetidine  cyclosporine  digoxin  local anesthetics or general anesthetics  lovastatin  medicines for anxiety or difficulty sleeping like midazolam and triazolam  medicines for high blood pressure or heart problems  quinidine  rifampin, rifabutin, or rifapentine This list may not describe all possible interactions. Give your health care provider a list of all the medicines, herbs, non-prescription drugs, or dietary supplements you use. Also tell them if you smoke, drink  alcohol, or use illegal drugs. Some items may interact with your medicine. What should I watch for while using this medicine? Visit your health care provider for regular checks on your progress. Check your blood pressure as directed. Ask your health care provider what your blood pressure should be. Also, find out when you should contact him or her. Do not treat yourself for coughs, colds, or pain while you are using this drug without asking your health care provider for advice. Some drugs may increase your blood pressure. This drug may cause serious skin reactions. They can happen weeks to months after starting the drug. Contact your health care provider right away if you notice fevers or flu-like symptoms with a rash. The rash may be red or purple and then turn into blisters or peeling of the skin. Or, you might notice a red rash with swelling of the face, lips or lymph nodes in your neck or under your arms. You may get drowsy or dizzy. Do not drive, use machinery, or do anything that needs mental alertness until you know how this drug affects you. Do not stand up or sit up quickly, especially if you are an older patient. This reduces the risk of dizzy or fainting spells. What side effects may I notice from receiving this medicine? Side effects that you should report to your doctor or health care provider as soon as possible:  allergic reactions (skin rash, itching or hives; swelling of the face, lips, or tongue)  heart failure (trouble breathing; fast, irregular heartbeat; sudden weight gain; swelling of the ankles, feet, hands; unusually weak or tired)  heartbeat rhythm changes (trouble breathing; chest pain; dizziness; fast, irregular heartbeat; feeling faint or lightheaded, falls)  liver injury (dark yellow or brown urine; general ill feeling or flu-like symptoms; loss of appetite, right upper belly pain; unusually weak or tired, yellowing of the eyes or skin)  redness, blistering, peeling, or  loosening of the skin, including inside the mouth Side effects that usually do not require medical attention (report to your doctor or health care provider if they continue or are bothersome):  changes in sex drive or performance  changes in vision  cough  depressed mood  headache  nasal congestion (like runny or stuffy nose)  sudden weight gain  trouble sleeping This list may not describe all possible side effects. Call your doctor for medical advice about side effects. You may report side effects to FDA at 1-800-FDA-1088. Where should I keep my medicine? Keep out of the reach of children and pets. Store at room temperature between 20 and 25 degrees C (68 and 77 degrees F). Protect from moisture. Keep the container tightly closed. Throw away any unused drug after the expiration date. NOTE: This sheet is a summary. It may not cover all possible information. If you have questions about this medicine, talk to your doctor, pharmacist,  or health care provider.  2020 Elsevier/Gold Standard (2019-01-15 14:48:13)   *If you need a refill on your cardiac medications before your next appointment, please call your pharmacy*   Lab Work: None If you have labs (blood work) drawn today and your tests are completely normal, you will receive your results only by:  [place] (if you have MyChart) OR  A paper copy in the mail If you have any lab test that is abnormal or we need to change your treatment, we will call you to review the results.   Testing/Procedures: None   Follow-Up: At [facility], you and your health needs are our priority.  As part of our continuing mission to provide you with exceptional heart care, we have created designated Provider Care Teams.  These Care Teams include your primary Cardiologist (physician) and Advanced Practice Providers (APPs -  Physician Assistants and Nurse Practitioners) who all work together to provide you with the care you need, when you  need it.  We recommend signing up for the patient portal called "MyChart".  Sign up information is provided on this After Visit Summary.  MyChart is used to connect with patients for Virtual Visits (Telemedicine).  Patients are able to view lab/test results, encounter notes, upcoming appointments, etc.  Non-urgent messages can be sent to your provider as well.   To learn more about what you can do with MyChart, go to [email or web address].    Your next appointment:   3 month(s)  The format for your next appointment:   In Person  Provider:   Shirlee More, MD   Other Instructions

## 2020-05-04 ENCOUNTER — Telehealth: Payer: Self-pay

## 2020-05-04 DIAGNOSIS — Z1231 Encounter for screening mammogram for malignant neoplasm of breast: Secondary | ICD-10-CM

## 2020-05-04 NOTE — Telephone Encounter (Signed)
Left message on patients voicemail to please return our call.   

## 2020-05-04 NOTE — Telephone Encounter (Signed)
Patient returning [employer]. Patient said she saw the results on MyChart and also had an appointment with Dr. Dulce Sellar last week. Please call back if needed.

## 2020-05-11 MED FILL — ROSUVASTATIN CALCIUM 10 MG: 10 | 90 days supply | Qty: 90 | Fill #2

## 2020-05-13 ENCOUNTER — Other Ambulatory Visit: Payer: Self-pay | Admitting: Obstetrics and Gynecology

## 2020-05-13 DIAGNOSIS — K219 Gastro-esophageal reflux disease without esophagitis: Secondary | ICD-10-CM | POA: Diagnosis not present

## 2020-05-13 DIAGNOSIS — J4520 Mild intermittent asthma, uncomplicated: Secondary | ICD-10-CM | POA: Diagnosis not present

## 2020-05-13 DIAGNOSIS — Z1231 Encounter for screening mammogram for malignant neoplasm of breast: Secondary | ICD-10-CM

## 2020-05-13 DIAGNOSIS — I2510 Atherosclerotic heart disease of native coronary artery without angina pectoris: Secondary | ICD-10-CM | POA: Diagnosis not present

## 2020-05-14 DIAGNOSIS — I2510 Atherosclerotic heart disease of native coronary artery without angina pectoris: Secondary | ICD-10-CM | POA: Insufficient documentation

## 2020-05-14 NOTE — Telephone Encounter (Signed)
Please advise on patient mychart message  Hi,  hope you and yours had a wonderful holiday season!  Thought I would give you an update on how I've been feeling/progression/doing since last visit in December.  Have been taking the Claritin daily, Omeprazole twice daily (Dr Dulce Sellar increased it to twice a day), Crestor daily and Cartia XT daily as directed.  I have started feeling MUCH better % am starting to get more sleep.    I have had absolutely no breathing issues or asthma episodes and absolutely no GERD/heartburn at all.  In additional I walk between 4-6 miles EVERY day with no issues.  AND I have very slowly started to run again - I am using internals of walk/run for a while until I get my conditioning back - won't start full running for a month?  Granted running distance is at this point only 1 mile, but when I am running, I am not having any issues and have not had to use my inhaler at all.    I am hopeful this feeling/progress will continue, and I can return to my active lifestyle in the very near future.  If there are any changes, I will make an appointment to discuss.  If you have any advice or need me to change something, please let me know.  See you in March!  Thanks! Berkley Harvey

## 2020-05-15 ENCOUNTER — Other Ambulatory Visit: Payer: Self-pay

## 2020-05-15 ENCOUNTER — Ambulatory Visit
Admission: RE | Admit: 2020-05-15 | Discharge: 2020-05-15 | Disposition: A | Discharge status: Home / Self Care | Payer: 59 | Source: Ambulatory Visit

## 2020-05-15 DIAGNOSIS — Z1231 Encounter for screening mammogram for malignant neoplasm of breast: Secondary | ICD-10-CM

## 2020-05-25 MED FILL — CARTIA XT 180 MG CAPSULE SA: 180 | 30 days supply | Qty: 30 | Fill #1

## 2020-05-27 DIAGNOSIS — Z01419 Encounter for gynecological examination (general) (routine) without abnormal findings: Secondary | ICD-10-CM | POA: Diagnosis not present

## 2020-06-22 MED FILL — OMEPRAZOLE DR 20 MG CAPSULE: 20 | 90 days supply | Qty: 180 | Fill #0

## 2020-06-22 MED FILL — CARTIA XT 180 MG CAPSULE SA: 180 | 30 days supply | Qty: 30 | Fill #2

## 2020-06-30 ENCOUNTER — Ambulatory Visit: Payer: 59 | Admitting: Cardiology

## 2020-07-17 DIAGNOSIS — E7800 Pure hypercholesterolemia, unspecified: Secondary | ICD-10-CM | POA: Insufficient documentation

## 2020-07-27 ENCOUNTER — Encounter: Payer: Self-pay | Admitting: Emergency Medicine

## 2020-07-27 ENCOUNTER — Other Ambulatory Visit: Payer: Self-pay

## 2020-07-27 ENCOUNTER — Ambulatory Visit: Payer: 59 | Admitting: Emergency Medicine

## 2020-07-27 DIAGNOSIS — J301 Allergic rhinitis due to pollen: Secondary | ICD-10-CM

## 2020-07-27 DIAGNOSIS — K219 Gastro-esophageal reflux disease without esophagitis: Secondary | ICD-10-CM

## 2020-07-27 DIAGNOSIS — J4520 Mild intermittent asthma, uncomplicated: Secondary | ICD-10-CM | POA: Diagnosis not present

## 2020-07-27 DIAGNOSIS — E785 Hyperlipidemia, unspecified: Secondary | ICD-10-CM | POA: Diagnosis not present

## 2020-07-27 DIAGNOSIS — R7309 Other abnormal glucose: Secondary | ICD-10-CM | POA: Diagnosis not present

## 2020-07-27 NOTE — Assessment & Plan Note (Signed)
Keep albuterol available use 2 puffs if you need if short of breath, chest tightness, wheezing.  Agree with pretreating strenuous exertion with 2 puffs. Follow Dr. Lamonte Sakai in 6 months or sooner if you have any problems

## 2020-07-27 NOTE — Patient Instructions (Addendum)
OK to try changing your loratadine to generic Allegra (fexofenedine) 180mg  daily to see if this helps more. You may decide to restart your fluticasone nasal spray if your allergies and throat irritation become more active during the spring months. Continue your omeprazole twice a day you have been taking it.  Agree that you may be able to decrease it if your reflux is under good control.  Discussed with Dr. Bettina Gavia. Keep albuterol available use 2 puffs if you need if short of breath, chest tightness, wheezing.  Agree with pretreating strenuous exertion with 2 puffs. Follow Dr. Lamonte Sakai in 6 months or sooner if you have any problems.

## 2020-07-27 NOTE — Assessment & Plan Note (Signed)
OK to try changing your loratadine to generic Allegra (fexofenedine) 180mg  daily to see if this helps more. You may decide to restart your fluticasone nasal spray if your allergies and throat irritation become more active during the spring months.

## 2020-07-27 NOTE — Progress Notes (Signed)
Subjective:    Patient ID: Rhonda [name], female    DOB: [birth year], 70 y.o.   MRN: [id number]  Asthma There is no cough, shortness of breath or wheezing. Pertinent negatives include no ear pain, fever, headaches, postnasal drip, rhinorrhea, sneezing, sore throat or trouble swallowing. Her past medical history is significant for asthma.     ROV 05/07/2019 --follow-up visit for pleasant 70 year old woman with a history of mild asthma and chronic upper airway irritation with cough.  There is an exercise-induced component to her airflow obstruction and she has used albuterol successfully prior to exercise.  She has been treated for GERD with Prilosec which she uses prn, not currently on any rhinitis therapy.  She has albuterol which she has not required at all recently. She had started training to run again but had trouble doing so due to L broken wrist.   ROV 11/01/19 --70 year old woman who follows today for mild intermittent asthma and chronic upper irritation with cough.  She does have some evidence for exertional bronchospasm and has benefited from using albuterol before exercise.  She has had a very stressful few days due to family events, has noticed some chest tightness, discomfort especially when she is anxious or upset. Overall she is doing well - hasn't used any albuterol since last time. Her exercise is still limited by her hip injury - she is completing physical therapy. Still using omeprazole prn - rare use.   ROV 02/28/2020 --follow-up visit for 70 year old woman with chronic upper airway irritation with associated cough, mild intermittent asthma with a positive bronchodilator response on pulmonary function testing from 10/03/2017.  She typically uses albuterol prior to exercise. Has albuterol >> using seldomly because she has not been exercising as much. Her hip is improving and she has been cleared to run, but her cardio isn't rebuilding like she would expect. She has had some  exertional CP and dyspnea. She has seen some relief when she pre-treats exercise w albuterol. She is undergoing cardiac risk stratification with Dr Bettina Gavia.  She reports developing mid to upper chest and throat pressure, can happen at any time. She has a daily cough, non productive. Can be relieved w albuterol. Uses omeprazole but only prn. Lots of nasal gtt and drainage - on no meds she uses flonase prn, rarely.   Rare omeprazole  use   ROV 07/27/20 --follow-up visit for mild intermittent asthma and chronic upper airway irritation with associated cough.  She has a positive bronchodilator response and pretreats exercise consistently otherwise uses albuterol rarely.  Also on omeprazole, increased to bid since last time, and fluticasone as needed. Remains on loratadine-D.   Since last time she underwent coronary CT, calcium score 66 (58th percentile), showed moderate LAD stenosis at D1.  Plavix and calcium channel blocker were both added. She reports that she has been doing well >> exercise tolerance a bit better now. She is careful w certain foods that cause her GERD and then her asthma. Minimal albuterol since last time  MDM: Reviewed cardiology notes 04/30/2020 Reviewed IM note 05/13/2020 and 07/27/2020   Review of Systems  Constitutional: Negative for fever and unexpected weight change.  HENT: Negative for congestion, dental problem, ear pain, nosebleeds, postnasal drip, rhinorrhea, sinus pressure, sneezing, sore throat and trouble swallowing.   Eyes: Negative for redness and itching.  Respiratory: Positive for chest tightness. Negative for cough, shortness of breath and wheezing.   Cardiovascular: Negative for palpitations and leg swelling.  Gastrointestinal: Negative for nausea and vomiting.  Genitourinary: Negative for dysuria.  Musculoskeletal: Negative for joint swelling.  Skin: Negative for rash.  Neurological: Negative for headaches.  Hematological: Does not bruise/bleed easily.   Psychiatric/Behavioral: Negative for dysphoric mood. The patient is not nervous/anxious.        Objective:   Physical Exam Vitals:   07/27/20 1128  BP: 118/76  Pulse: 67  Temp: 97.6 F (36.4 C)  TempSrc: Temporal  SpO2: 99%  Weight: 137 lb (62.1 kg)  Height: 5' (1.524 m)    Gen: Pleasant, well-nourished, in no distress,  normal affect  ENT: No lesions,  mouth clear,  oropharynx clear, no postnasal drip  Neck: No JVD, no stridor  Lungs: No use of accessory muscles, no dullness to percussion, clear without rales or rhonchi  Cardiovascular: RRR, heart sounds normal, no murmur or gallops, no peripheral edema  Musculoskeletal: No deformities, no cyanosis or clubbing  Neuro: alert, non focal  Skin: Warm, no lesions or rash      Assessment & Plan:  Allergic rhinitis OK to try changing your loratadine to generic Allegra (fexofenedine) 180mg  daily to see if this helps more. You may decide to restart your fluticasone nasal spray if your allergies and throat irritation become more active during the spring months.  Mild intermittent asthma without complication Keep albuterol available use 2 puffs if you need if short of breath, chest tightness, wheezing.  Agree with pretreating strenuous exertion with 2 puffs. Follow Dr. Lamonte Sakai in 6 months or sooner if you have any problems   Gastroesophageal reflux disease without esophagitis Better control since she scheduled her omeprazole.  This may translate into better asthma control.  Would like for her to continue.  She may decrease it to once daily going forward depending on how much GERD symptoms she experiences.  Baltazar Apo, MD, PhD 07/27/2020, 11:50 AM [facility] [phone] or if no answer [phone]

## 2020-07-27 NOTE — Assessment & Plan Note (Signed)
Better control since she scheduled her omeprazole.  This may translate into better asthma control.  Would like for her to continue.  She may decrease it to once daily going forward depending on how much GERD symptoms she experiences.

## 2020-07-27 NOTE — Progress Notes (Unsigned)
Cardiology Office Note:    Date:  07/28/2020   ID:  Rhonda [name], DOB 1950-12-03, MRN [id number]  PCP:  Loraine Leriche., MD  Cardiologist:  Shirlee More, MD    Referring MD: Loraine Leriche.,*    ASSESSMENT:    1. Coronary artery disease of native artery of native heart with stable angina pectoris ([place])   2. Mixed hyperlipidemia    PLAN:    In order of problems listed above:  1. Stable.  Will initiate Plavix 75 mg/day as the patient cannot take aspirin due to history of anaphylaxis.  She is going to start training for her next half marathon and I did give her permission to start slowly increasing her training in order to prepare for it.  She will continue her statin and diltiazem. 2. Continue statin as above.  Most recent lipid profile 07/27/2020 cholesterol 144 direct LDL 55 triglycerides 87 HDL 72 at target  Next appointment: 6 months   Medication Adjustments/Labs and Tests Ordered: Current medicines are reviewed at length with the patient today.  Concerns regarding medicines are outlined above.  No orders of the defined types were placed in this encounter.  Meds ordered this encounter  Medications  . clopidogrel (PLAVIX) 75 MG tablet    Sig: Take 1 tablet (75 mg total) by mouth daily.    Dispense:  90 tablet    Refill:  3  . diltiazem (CARDIZEM CD) 180 MG 24 hr capsule    Sig: Take 1 capsule (180 mg total) by mouth daily.    Dispense:  90 capsule    Refill:  3    Chief Complaint  Patient presents with  . Follow-up  . Coronary Artery Disease    History of Present Illness:    Rhonda [name] is a 70 y.o. female with a hx of CAD hyperlipidemia asthma and GERD last seen 04/30/2020.  Her coronary CTA showed a calcium score of 66 which was 50th percentile for age and sex moderate stenosis in the LAD at the origin of the first diagonal branch 50 to 69%.  Compliance with diet, lifestyle and medications: Yes  Today she states she is doing well.  She  states that her GERD has gotten under control and she is also notes that her asthma has improved as well.  She denies complaints at this time.  She also recently started back on weight watchers and has lost over 5 pounds at this point.  She states her main goal today is to get the "okay" to start training for her next half marathon which she plans to do in October.  She states that she has been doing a few miles per week but would like to increase her level of training.  She states that she continues on her Crestor and diltiazem but cannot take aspirin due to history of anaphylaxis.  We discussed starting Plavix as an alternate and she is all right with this.  Past Medical History:  Diagnosis Date  . Achilles tendinitis of right lower extremity 06/19/2014   US showed minor changes in December 15  Most sxs seem to be enthesopathy  Note had reaction to NTG in past  Overview:  Overview:  US showed minor changes in December 15  Most sxs seem to be enthesopathy  Note had reaction to NTG in past  Last Assessment & Plan:  Recent irritation in the race appears to be a partial plantaris tear  This continues to give her some Achilles  symptoms  I think a period  . ALLERGIC RHINITIS 10/12/2006   Qualifier: Diagnosis of  By: McDiarmid MD, Sherren Mocha    . Allergic rhinitis 10/12/2006   Overview:  Overview:  Qualifier: Diagnosis of  By: McDiarmid MD, Cyndie Chime of this note might be different from the original. Overview:  Qualifier: Diagnosis of  By: McDiarmid MD, Sherren Mocha  . Ankle pain 08/17/2011  . Asthma 10/12/2006   Qualifier: Diagnosis of  By: McDiarmid MD, Sherren Mocha    Overview:  Overview:  Qualifier: Diagnosis of  By: McDiarmid MD, Sherren Mocha   . Benign paroxysmal positional vertigo 11/13/2013  . CARCINOMA, BASAL CELL 02/07/2008   Qualifier: History of  By: Drue Flirt  MD, Merrily Brittle    Overview:  Overview:  Qualifier: History of  By: Drue Flirt  MD, Merrily Brittle   Overview:  Qualifier: History of  By: Drue Flirt  MD, Merrily Brittle   . Chest pain in  adult 08/17/2011   This has worrisome features particularly with her level of exertion   . Exercise-induced asthma 05/24/2018  . First degree AV block 03/21/2018  . Gastroesophageal reflux disease without esophagitis 07/23/2018  . HAMMER TOE, OTHER, ACQUIRED 10/13/2006   Qualifier: Diagnosis of  By: McDiarmid MD, Sherren Mocha    Overview:  Overview:  Qualifier: Diagnosis of  By: McDiarmid MD, Sherren Mocha   . High cholesterol   . Hyperlipidemia   . Hyperlipidemia LDL goal <130 10/24/2013  . Injury of plantaris muscle or tendon 10/30/2014  . Insomnia 11/13/2013  . Knee pain, left 06/15/2017  . Left hip pain 11/04/2019  . LEG PAIN, RIGHT 09/22/2009   Qualifier: Diagnosis of  By: Oneida Alar MD, KARL    . Lichen sclerosus of female genitalia 07/24/2019  . Malignant neoplasm of female breast ([place]) 02/07/2008   Qualifier: History of  By: Drue Flirt  MD, Merrily Brittle    Overview:  Overview:  Qualifier: History of  By: Drue Flirt  MD, Merrily Brittle  Overview:  Overview:  Qualifier: History of  By: Drue Flirt  MD, Merrily Brittle   . Metatarsal stress fracture 08/29/2012   This is at the distal shaft of the fifth metatarsal left foot   . Mild intermittent asthma without complication 4/62/7035  . Muscle tear 11/15/2012   Left rectus femoris; also like tendon tear   . Numbness and tingling of foot 03/29/2012  . Patellar tendinitis 03/07/2013   I suspect this occurred because she did less crosstraining before this marathon and was probably hamstring dominant   . Right foot pain 04/06/2016  . Right knee pain 04/23/2013   04/23/13  ultrasound today RT knee  revealed no significant effusion The quadriceps tendon is intact as is the patellar tendon The medial meniscus was normal The lateral meniscus had an area of calcification and a very small area of splitting along the midline to the posterior third of the joint line   . Rotator cuff tendinitis, left 09/08/2016   Overview:  Last Assessment & Plan:  Significant improvement  See OV summary  . Sensorineural  hearing loss 11/13/2013  . Stress fracture of pelvis 08/29/2007   Qualifier: History of  By: Drue Flirt  MD, Merrily Brittle    . STRESS FRACTURE, TIBIA 06/15/2009   Qualifier: Diagnosis of  By: Drue Flirt  MD, Merrily Brittle    . Thrombocytopenia ([place]) 01/30/2014  . Tibialis tendinitis 08/12/2009   Qualifier: Diagnosis of  By: Oneida Alar MD, KARL    . Tinnitus 11/13/2013  . TROCHANTERIC BURSITIS, LEFT 04/27/2010   Qualifier: Diagnosis of  By: Ernestina Patches MD, Remo Lipps    .  UNEQUAL LEG LENGTH 09/18/2008   Qualifier: Diagnosis of  By: Oneida Alar MD, KARL    . Wrist pain, acute, left 04/23/2019   US shows small fracture off tip of lister's tubercle Extensor comp 4 strain    Past Surgical History:  Procedure Laterality Date  . ABDOMINAL SURGERY    . ABDOMINAL SURGERY     for removal of abdominal mass-cancerous  . KNEE SURGERY    . MANDIBLE FRACTURE SURGERY    . NASAL SINUS SURGERY    . TONSILLECTOMY      Current Medications: Current Meds  Medication Sig  . albuterol (PROVENTIL HFA) 108 (90 Base) MCG/ACT inhaler Inhale 2 puffs into the lungs every 6 (six) hours as needed for wheezing.  . clobetasol ointment (TEMOVATE) 1.61 % Apply 1 application topically daily as needed.  . clopidogrel (PLAVIX) 75 MG tablet Take 1 tablet (75 mg total) by mouth daily.  Marland Kitchen loratadine (CLARITIN) 10 MG tablet Take 1 tablet by mouth daily.  Marland Kitchen omeprazole (PRILOSEC) 20 MG capsule Take 1 capsule (20 mg total) by mouth 2 (two) times daily.  . rosuvastatin (CRESTOR) 10 MG tablet Take 10 mg by mouth daily.  . [DISCONTINUED] diltiazem (CARDIZEM CD) 180 MG 24 hr capsule Take 1 capsule (180 mg total) by mouth daily.     Allergies:   Acetaminophen, Aspirin, Nsaids, and Pravastatin   Social History   Socioeconomic History  . Marital status: Married    Spouse name: Not on file  . Number of children: Not on file  . Years of education: Not on file  . Highest education level: Not on file  Occupational History  . Not on file  Tobacco Use  . Smoking  status: Never Smoker  . Smokeless tobacco: Never Used  Vaping Use  . Vaping Use: Never used  Substance and Sexual Activity  . Alcohol use: Yes    Comment: occ glass of wine  . Drug use: No  . Sexual activity: Not on file  Other Topics Concern  . Not on file  Social History Narrative  . Not on file   Social Determinants of Health   Financial Resource Strain: Not on file  Food Insecurity: Not on file  Transportation Needs: Not on file  Physical Activity: Not on file  Stress: Not on file  Social Connections: Not on file     Family History: The patient's family history includes COPD in her mother; Diabetes in her father and sister; Parkinson's disease in her father; Pulmonary fibrosis in her father; Thyroid disease in her sister. There is no history of Heart attack. ROS:   Please see the history of present illness.    All other systems reviewed and are negative.  EKGs/Labs/Other Studies Reviewed:    The following studies were reviewed today:   Recent Labs: 04/15/2020: BUN 10; Creatinine, Ser 0.79; Potassium 4.4; Sodium 142  Recent Lipid Panel No results found for: CHOL, TRIG, HDL, CHOLHDL, VLDL, LDLCALC, LDLDIRECT  Physical Exam:    VS:  BP 120/64   Pulse 72   Ht 5' (1.524 m)   Wt 136 lb 0.6 oz (61.7 kg)   SpO2 98%   BMI 26.57 kg/m     Wt Readings from Last 3 Encounters:  07/28/20 136 lb 0.6 oz (61.7 kg)  07/27/20 137 lb (62.1 kg)  04/30/20 140 lb (63.5 kg)     GEN: Well nourished, well developed in no acute distress HEENT: Normal NECK: No JVD; No carotid bruits LYMPHATICS: No lymphadenopathy CARDIAC:  RRR, no murmurs, rubs, gallops RESPIRATORY:  Clear to auscultation without rales, wheezing or rhonchi  ABDOMEN: Soft, non-tender, non-distended MUSCULOSKELETAL:  No edema; No deformity  SKIN: Warm and dry NEUROLOGIC:  Alert and oriented x 3 PSYCHIATRIC:  Normal affect    Signed, Shirlee More, MD  07/28/2020 3:33 PM    [facility]

## 2020-07-28 ENCOUNTER — Ambulatory Visit: Payer: 59 | Admitting: Cardiology

## 2020-07-28 ENCOUNTER — Other Ambulatory Visit: Payer: Self-pay

## 2020-07-28 VITALS — BP 120/64 | HR 72 | Ht 60.0 in | Wt 136.0 lb

## 2020-07-28 DIAGNOSIS — E782 Mixed hyperlipidemia: Secondary | ICD-10-CM

## 2020-07-28 DIAGNOSIS — I25118 Atherosclerotic heart disease of native coronary artery with other forms of angina pectoris: Secondary | ICD-10-CM | POA: Diagnosis not present

## 2020-07-28 MED ORDER — DILTIAZEM HCL ER COATED BEADS 180 MG PO CP24: 180.0000 mg | ORAL_CAPSULE | Freq: Every day | ORAL | 3 refills | Status: DC

## 2020-07-28 MED ORDER — PANTOPRAZOLE SODIUM 40 MG PO TBEC: 40.0000 mg | DELAYED_RELEASE_TABLET | Freq: Every day | ORAL | 3 refills | Status: DC

## 2020-07-28 MED ORDER — CLOPIDOGREL BISULFATE 75 MG PO TABS: 75.0000 mg | ORAL_TABLET | Freq: Every day | ORAL | 3 refills | Status: DC

## 2020-07-28 MED FILL — CLOPIDOGREL 75 MG TABLET: 75 | 90 days supply | Qty: 90 | Fill #0

## 2020-07-28 MED FILL — DILTIAZEM HCL ER COATED BEA: 180 | 90 days supply | Qty: 90 | Fill #0

## 2020-07-28 MED FILL — PANTOPRAZOLE SOD DR 40 MG T: 40 | 90 days supply | Qty: 90 | Fill #0

## 2020-07-28 NOTE — Patient Instructions (Signed)
Medication Instructions:  Your physician has recommended you make the following change in your medication:  START: Plavix 75 mg take one tablet by mouth daily.  *If you need a refill on your cardiac medications before your next appointment, please call your pharmacy*   Lab Work: None If you have labs (blood work) drawn today and your tests are completely normal, you will receive your results only by: Marland Kitchen MyChart Message (if you have MyChart) OR . A paper copy in the mail If you have any lab test that is abnormal or we need to change your treatment, we will call you to review the results.   Testing/Procedures: None   Follow-Up: At [facility], you and your health needs are our priority.  As part of our continuing mission to provide you with exceptional heart care, we have created designated Provider Care Teams.  These Care Teams include your primary Cardiologist (physician) and Advanced Practice Providers (APPs -  Physician Assistants and Nurse Practitioners) who all work together to provide you with the care you need, when you need it.  We recommend signing up for the patient portal called "MyChart".  Sign up information is provided on this After Visit Summary.  MyChart is used to connect with patients for Virtual Visits (Telemedicine).  Patients are able to view lab/test results, encounter notes, upcoming appointments, etc.  Non-urgent messages can be sent to your provider as well.   To learn more about what you can do with MyChart, go to [email or web address].    Your next appointment:   6 month(s)  The format for your next appointment:   In Person  Provider:   Shirlee More, MD   Other Instructions

## 2020-07-28 NOTE — Addendum Note (Signed)
Addended by: Resa Miner I on: 07/28/2020 03:56 PM   Modules accepted: Orders

## 2020-07-29 DIAGNOSIS — E785 Hyperlipidemia, unspecified: Secondary | ICD-10-CM | POA: Diagnosis not present

## 2020-07-29 DIAGNOSIS — J4520 Mild intermittent asthma, uncomplicated: Secondary | ICD-10-CM | POA: Diagnosis not present

## 2020-07-29 DIAGNOSIS — K219 Gastro-esophageal reflux disease without esophagitis: Secondary | ICD-10-CM | POA: Diagnosis not present

## 2020-07-29 DIAGNOSIS — I2510 Atherosclerotic heart disease of native coronary artery without angina pectoris: Secondary | ICD-10-CM | POA: Diagnosis not present

## 2020-08-07 MED FILL — ROSUVASTATIN CALCIUM 10 MG: 10 | 90 days supply | Qty: 90 | Fill #0

## 2020-08-11 ENCOUNTER — Ambulatory Visit: Payer: 59 | Attending: Internal Medicine

## 2020-08-11 DIAGNOSIS — Z23 Encounter for immunization: Secondary | ICD-10-CM

## 2020-08-11 NOTE — Progress Notes (Signed)
   Covid-19 Vaccination Clinic  Name:  Rhonda [name]    MRN: [id number] DOB: 1950/05/29  08/11/2020  Rhonda [name] was observed post Covid-19 immunization for 15 minutes without incident. She was provided with Vaccine Information Sheet and instruction to access the V-Safe system.   Rhonda [name] was instructed to call 911 with any severe reactions post vaccine: Marland Kitchen Difficulty breathing  . Swelling of face and throat  . A fast heartbeat  . A bad rash all over body  . Dizziness and weakness   Immunizations Administered    Name Date Dose VIS Date Route   PFIZER Comrnaty(Gray TOP) Covid-19 Vaccine 08/11/2020  3:08 PM 0.3 mL 04/16/2020 Intramuscular   Manufacturer: [employer]   Lot: [id number]   [place]: [phone]

## 2020-08-17 ENCOUNTER — Other Ambulatory Visit (HOSPITAL_COMMUNITY): Payer: Self-pay

## 2020-08-17 DIAGNOSIS — J0100 Acute maxillary sinusitis, unspecified: Secondary | ICD-10-CM | POA: Diagnosis not present

## 2020-08-17 MED ORDER — PREDNISONE 10 MG PO TABS
ORAL_TABLET | ORAL | 0 refills | Status: DC
  Filled 2020-08-17: qty 21, 6d supply, fill #0

## 2020-08-17 MED ORDER — DOXYCYCLINE HYCLATE 100 MG PO CAPS
100.0000 mg | ORAL_CAPSULE | Freq: Two times a day (BID) | ORAL | 0 refills | Status: DC
  Filled 2020-08-17: qty 14, 7d supply, fill #0

## 2020-08-18 ENCOUNTER — Other Ambulatory Visit (HOSPITAL_BASED_OUTPATIENT_CLINIC_OR_DEPARTMENT_OTHER): Payer: Self-pay

## 2020-08-18 MED ORDER — PFIZER-BIONT COVID-19 VAC-TRIS 30 MCG/0.3ML IM SUSP
INTRAMUSCULAR | 0 refills | Status: DC
  Filled 2020-08-18: qty 0.3, 1d supply, fill #0

## 2020-08-25 ENCOUNTER — Other Ambulatory Visit: Payer: Self-pay

## 2020-08-25 ENCOUNTER — Ambulatory Visit: Payer: 59 | Admitting: Sports Medicine

## 2020-08-25 VITALS — BP 122/82 | Ht 60.0 in | Wt 130.0 lb

## 2020-08-25 DIAGNOSIS — M25571 Pain in right ankle and joints of right foot: Secondary | ICD-10-CM | POA: Diagnosis not present

## 2020-08-25 NOTE — Patient Instructions (Signed)
It was great to see you today!  For your ankle, please use the ankle compression sleeve when you are active and up on your feet for awhile. Do the ankle exercises we showed you daily, make sure they are pain free. Walk until you can walk normally, no pain. Then try a walk/run combination until that is pain free. Progress through activities as tolerated as pain.  Call with any questions!

## 2020-08-25 NOTE — Progress Notes (Signed)
   PCP: Loraine Leriche., MD  Subjective:   HPI: Patient is a 70 y.o. female here for right ankle pain.  Right ankle pain Ms. Odom reports that she has been having right ankle pain for the past 2 days.  She had taken quite a long break from her long distance running and only recently began training again about 1 month ago.  Additionally, she has recently been giving a steroid taper for the sinusitis which she completed this past Sunday.  She notes that she was working at home changing the laundry from the washer to the dryer when she noticed a sharp pain just above her right ankle of a sudden.  She did not roll her ankle or experience any trauma to her right ankle.  She has continued to experience some sharp pain just above her right ankle since Sunday.  Overall, this discomfort seems to be improving although she did have to take a day off from running yesterday.  Review of Systems:  Per HPI.   [place], medications and smoking status reviewed.      Objective:  Physical Exam:  No flowsheet data found.   Gen: awake, alert, NAD, comfortable in exam room Pulm: breathing unlabored  Ankle: - Inspection: No obvious deformity, erythema, ecchymosis, ulcers, calluses, blisters.  Mild swelling noted above the sinus tarsus. - Palpation: No TTP at MT heads, no TTP at base of 5th MT, no TTP over cuboid, no tenderness over navicular prominence, no TTP over lateral or medial malleolus.  Mild discomfort with active dorsiflexion.  No significant tenderness with passive range of motion including dorsiflexion.  Her discomfort is coming from the location of about 4-5 cm superior to the lateral malleolus. - Strength: Normal strength with dorsiflexion, plantarflexion, inversion, and eversion of foot; flexion and extension of toes - ROM: Full ROM - Neuro/vasc: NV intact - Special Tests: Negative anterior drawer  Ultrasound: Mild anechoic fluid collection over the sinus tarsus.  Peroneus tertius was  visualized and tracked without evidence of significant inflammation or rupture.  There is hypoechoic change along path of peroneus tertius.   Assessment & Plan:  1.  Peroneus tertius injury Her description of the discomfort does sound mildly suspicious for arthritis although physical exam does not show evidence of joint effusion and she does not have any known history of ankle arthritis.  It is possible she has a peroneus tertius injury likely secondary to her recent steroid course.  Ultrasound does demonstrate some fluid collection in the sinus tarsus with swelling proximal to but without significant injury to the peroneus tertius.  For now, we will treat as an injury of the peroneus tertius.  She was encouraged to use a compression sleeve for additional stability for the coming weeks.  Matilde Haymaker, MD PGY-3 08/25/2020 9:16 AM   I observed and examined the patient with the resident and agree with assessment and plan.  Note reviewed and modified by me. Ila Mcgill, MD

## 2020-08-26 DIAGNOSIS — H2513 Age-related nuclear cataract, bilateral: Secondary | ICD-10-CM | POA: Diagnosis not present

## 2020-09-16 ENCOUNTER — Telehealth: Payer: Self-pay

## 2020-09-16 DIAGNOSIS — Z006 Encounter for examination for normal comparison and control in clinical research program: Secondary | ICD-10-CM

## 2020-09-16 NOTE — Telephone Encounter (Signed)
I called patient for her 90-day Identify Study phone call. Patient is doing well with no cardiac symptoms at this time. I reminded patient I would call her in Dec. for 1 year follow-up.

## 2020-10-09 ENCOUNTER — Other Ambulatory Visit (HOSPITAL_BASED_OUTPATIENT_CLINIC_OR_DEPARTMENT_OTHER): Payer: Self-pay

## 2020-10-09 ENCOUNTER — Emergency Department
Admission: RE | Admit: 2020-10-09 | Discharge: 2020-10-09 | Disposition: A | Discharge status: Home / Self Care | Payer: 59 | Source: Ambulatory Visit

## 2020-10-09 ENCOUNTER — Other Ambulatory Visit: Payer: Self-pay

## 2020-10-09 VITALS — BP 130/89 | HR 70 | Temp 98.2°F | Resp 15 | Ht 60.0 in | Wt 132.0 lb

## 2020-10-09 DIAGNOSIS — W57XXXA Bitten or stung by nonvenomous insect and other nonvenomous arthropods, initial encounter: Secondary | ICD-10-CM | POA: Diagnosis not present

## 2020-10-09 DIAGNOSIS — S80862A Insect bite (nonvenomous), left lower leg, initial encounter: Secondary | ICD-10-CM | POA: Diagnosis not present

## 2020-10-09 MED ORDER — TRIAMCINOLONE ACETONIDE 0.1 % EX CREA
1.0000 "application " | TOPICAL_CREAM | Freq: Two times a day (BID) | CUTANEOUS | 0 refills | Status: DC
  Filled 2020-10-09: qty 30, 15d supply, fill #0

## 2020-10-09 MED ORDER — METHYLPREDNISOLONE 4 MG PO TBPK: ORAL_TABLET | ORAL | 0 refills | Status: DC

## 2020-10-09 NOTE — ED Provider Notes (Signed)
[name]    CSN: [id number] Arrival date & time: 10/09/20  1354      History   Chief Complaint Chief Complaint  Patient presents with  . Insect Bite    HPI Rhonda [name] is a 70 y.o. female who presents with large red spots in her inner thighs. She thinks she got bit 6 days ago when she was in the woods. They do not hurt or itch. They started as red pin head size and after exercising this am, these areas are larger. During exercise she felt stinging sensation on them.     Past Medical History:  Diagnosis Date  . Achilles tendinitis of right lower extremity 06/19/2014   US showed minor changes in December 15  Most sxs seem to be enthesopathy  Note had reaction to NTG in past  Overview:  Overview:  US showed minor changes in December 15  Most sxs seem to be enthesopathy  Note had reaction to NTG in past  Last Assessment & Plan:  Recent irritation in the race appears to be a partial plantaris tear  This continues to give her some Achilles symptoms  I think a period  . ALLERGIC RHINITIS 10/12/2006   Qualifier: Diagnosis of  By: McDiarmid MD, Sherren Mocha    . Allergic rhinitis 10/12/2006   Overview:  Overview:  Qualifier: Diagnosis of  By: McDiarmid MD, Cyndie Chime of this note might be different from the original. Overview:  Qualifier: Diagnosis of  By: McDiarmid MD, Sherren Mocha  . Ankle pain 08/17/2011  . Asthma 10/12/2006   Qualifier: Diagnosis of  By: McDiarmid MD, Sherren Mocha    Overview:  Overview:  Qualifier: Diagnosis of  By: McDiarmid MD, Sherren Mocha   . Benign paroxysmal positional vertigo 11/13/2013  . Chest pain in adult 08/17/2011   This has worrisome features particularly with her level of exertion   . Exercise-induced asthma 05/24/2018  . First degree AV block 03/21/2018  . Gastroesophageal reflux disease without esophagitis 07/23/2018  . HAMMER TOE, OTHER, ACQUIRED 10/13/2006   Qualifier: Diagnosis of  By: McDiarmid MD, Sherren Mocha    Overview:  Overview:  Qualifier: Diagnosis of  By:  McDiarmid MD, Sherren Mocha   . High cholesterol   . Hyperlipidemia   . Hyperlipidemia LDL goal <130 10/24/2013  . Injury of plantaris muscle or tendon 10/30/2014  . Insomnia 11/13/2013  . Knee pain, left 06/15/2017  . Left hip pain 11/04/2019  . LEG PAIN, RIGHT 09/22/2009   Qualifier: Diagnosis of  By: Oneida Alar MD, KARL    . Lichen sclerosus of female genitalia 07/24/2019  . Metatarsal stress fracture 08/29/2012   This is at the distal shaft of the fifth metatarsal left foot   . Mild intermittent asthma without complication 6/38/4536  . Muscle tear 11/15/2012   Left rectus femoris; also like tendon tear   . Numbness and tingling of foot 03/29/2012  . Patellar tendinitis 03/07/2013   I suspect this occurred because she did less crosstraining before this marathon and was probably hamstring dominant   . Right foot pain 04/06/2016  . Right knee pain 04/23/2013   04/23/13  ultrasound today RT knee  revealed no significant effusion The quadriceps tendon is intact as is the patellar tendon The medial meniscus was normal The lateral meniscus had an area of calcification and a very small area of splitting along the midline to the posterior third of the joint line   . Rotator cuff tendinitis, left 09/08/2016   Overview:  Last  Assessment & Plan:  Significant improvement  See OV summary  . Sensorineural hearing loss 11/13/2013  . Stress fracture of pelvis 08/29/2007   Qualifier: History of  By: Drue Flirt  MD, Merrily Brittle    . STRESS FRACTURE, TIBIA 06/15/2009   Qualifier: Diagnosis of  By: Drue Flirt  MD, Merrily Brittle    . Thrombocytopenia ([place]) 01/30/2014  . Tibialis tendinitis 08/12/2009   Qualifier: Diagnosis of  By: Oneida Alar MD, KARL    . Tinnitus 11/13/2013  . TROCHANTERIC BURSITIS, LEFT 04/27/2010   Qualifier: Diagnosis of  By: Ernestina Patches MD, Remo Lipps    . UNEQUAL LEG LENGTH 09/18/2008   Qualifier: Diagnosis of  By: Oneida Alar MD, KARL    . Wrist pain, acute, left 04/23/2019   US shows small fracture off tip of lister's tubercle Extensor  comp 4 strain    Patient Active Problem List   Diagnosis Date Noted  . High cholesterol   . Hyperlipidemia   . Left hip pain 11/04/2019  . Lichen sclerosus of female genitalia 07/24/2019  . Wrist pain, acute, left 04/23/2019  . Gastroesophageal reflux disease without esophagitis 07/23/2018  . Exercise-induced asthma 05/24/2018  . First degree AV block 03/21/2018  . Knee pain, left 06/15/2017  . Rotator cuff tendinitis, left 09/08/2016  . Right foot pain 04/06/2016  . Injury of plantaris muscle or tendon 10/30/2014  . Achilles tendinitis of right lower extremity 06/19/2014  . Thrombocytopenia ([place]) 01/30/2014  . Benign paroxysmal positional vertigo 11/13/2013  . Insomnia 11/13/2013  . Sensorineural hearing loss 11/13/2013  . Tinnitus 11/13/2013  . Hyperlipidemia LDL goal <130 10/24/2013  . Mild intermittent asthma without complication 68/04/7516  . Right knee pain 04/23/2013  . Patellar tendinitis 03/07/2013  . Muscle tear 11/15/2012  . Metatarsal stress fracture 08/29/2012  . Numbness and tingling of foot 03/29/2012  . Ankle pain 08/17/2011  . Chest pain in adult 08/17/2011  . TROCHANTERIC BURSITIS, LEFT 04/27/2010  . LEG PAIN, RIGHT 09/22/2009  . TIBIALIS TENDINITIS 08/12/2009  . STRESS FRACTURE, TIBIA 06/15/2009  . UNEQUAL LEG LENGTH 09/18/2008  . CARCINOMA, BASAL CELL 02/07/2008  . Malignant neoplasm of female breast ([place]) 02/07/2008  . STRESS FRACTURE OF PELVIS 08/29/2007  . HAMMER TOE, OTHER, ACQUIRED 10/13/2006  . ALLERGIC RHINITIS 10/12/2006  . Asthma 10/12/2006  . Allergic rhinitis 10/12/2006    Past Surgical History:  Procedure Laterality Date  . ABDOMINAL SURGERY    . ABDOMINAL SURGERY     for removal of abdominal mass-cancerous  . KNEE SURGERY    . MANDIBLE FRACTURE SURGERY    . NASAL SINUS SURGERY    . TONSILLECTOMY      OB History   No obstetric history on file.      Home Medications    Prior to Admission medications   Medication Sig  Start Date End Date Taking? Authorizing Provider  methylPREDNISolone (MEDROL DOSEPAK) 4 MG TBPK tablet Take as directed if the cream does not help in 3 days 10/13/20  Yes Rodriguez-Southworth, Sunday Spillers, PA-C  triamcinolone cream (KENALOG) 0.1 % Apply 1 application on to the skin 2 (two) times daily. 10/09/20  Yes Rodriguez-Southworth, Sunday Spillers, PA-C  albuterol (PROVENTIL HFA) 108 (90 Base) MCG/ACT inhaler Inhale 2 puffs into the lungs every 6 (six) hours as needed for wheezing. 11/01/19   Collene Gobble, MD  clobetasol ointment (TEMOVATE) 0.01 % Apply 1 application topically daily as needed. 04/13/20   [provider]  clobetasol ointment (TEMOVATE) 0.05 % APPLY TO THE AFFECTED AREA(S) ONCE A DAY 04/13/20 04/13/21  Molli Posey, MD  clopidogrel (PLAVIX) 75 MG tablet TAKE 1 TABLET (75 MG TOTAL) BY MOUTH DAILY. 07/28/20 07/28/21  Richardo Priest, MD  COVID-19 mRNA Vac-TriS, Pfizer, (PFIZER-BIONT COVID-19 VAC-TRIS) SUSP injection Inject into the muscle. 08/11/20   Carlyle Basques, MD  diltiazem (CARDIZEM CD) 180 MG 24 hr capsule TAKE 1 CAPSULE (180 MG TOTAL) BY MOUTH DAILY. 07/28/20 07/28/21  Richardo Priest, MD  doxycycline (VIBRAMYCIN) 100 MG capsule Take 1 capsule (100 mg total) by mouth 2 (two) times daily for 7 days. 08/17/20     loratadine (CLARITIN) 10 MG tablet Take 1 tablet by mouth daily.    [provider]  pantoprazole (PROTONIX) 40 MG tablet TAKE 1 TABLET (40 MG TOTAL) BY MOUTH DAILY. 07/28/20 07/28/21  Richardo Priest, MD  rosuvastatin (CRESTOR) 10 MG tablet Take 10 mg by mouth daily. 01/31/20   [provider]  rosuvastatin (CRESTOR) 10 MG tablet TAKE 1 TABLET BY MOUTH DAILY. 11/13/19 11/12/20  Loraine Leriche., MD  rosuvastatin (CRESTOR) 10 MG tablet TAKE 1 TABLET BY MOUTH DAILY. 01/31/20 01/30/21  Loraine Leriche., MD  metoprolol tartrate (LOPRESSOR) 100 MG tablet Take 1 tablet (100 mg total) by mouth once for 1 dose. Take two hours prior to your cardiac CT 03/27/20  04/29/20  Richardo Priest, MD  omeprazole (PRILOSEC) 20 MG capsule Take 1 capsule (20 mg total) by mouth 2 (two) times daily. 04/30/20 07/28/20  Richardo Priest, MD    Family History Family History  Problem Relation Age of Onset  . COPD Mother   . Pulmonary fibrosis Father   . Parkinson's disease Father   . Diabetes Father   . Thyroid disease Sister   . Diabetes Sister   . Heart attack Neg Hx     Social History Social History   Tobacco Use  . Smoking status: Never Smoker  . Smokeless tobacco: Never Used  Vaping Use  . Vaping Use: Never used  Substance Use Topics  . Alcohol use: Yes    Comment: occ glass of wine  . Drug use: No     Allergies   Acetaminophen, Aspirin, Nsaids, and Pravastatin   Review of Systems Review of Systems  Musculoskeletal: Negative for gait problem.  Skin: Positive for rash. Negative for wound.  Hematological: Negative for adenopathy.  The rest is neg   Physical Exam Triage Vital Signs ED Triage Vitals  Enc Vitals Group     BP 10/09/20 1408 130/89     Pulse Rate 10/09/20 1408 70     Resp 10/09/20 1408 15     Temp 10/09/20 1408 98.2 F (36.8 C)     Temp Source 10/09/20 1408 Temporal     SpO2 10/09/20 1408 97 %     Weight 10/09/20 1404 132 lb (59.9 kg)     Height 10/09/20 1404 5' (1.524 m)     Head Circumference --      Peak Flow --      Pain Score 10/09/20 1404 0     Pain Loc --      Pain Edu? --      Excl. in [place]? --    No data found.  Updated Vital Signs BP 130/89 (BP Location: Left Arm)   Pulse 70   Temp 98.2 F (36.8 C) (Temporal)   Resp 15   Ht 5' (1.524 m)   Wt 132 lb (59.9 kg)   SpO2 97%   BMI 25.78 kg/m   Visual Acuity Right Eye  Distance:   Left Eye Distance:   Bilateral Distance:    Right Eye Near:   Left Eye Near:    Bilateral Near:     Physical Exam Vitals and nursing note reviewed.  HENT:     Head: Normocephalic.     Right Ear: External ear normal.     Left Ear: External ear normal.  Eyes:      General: No scleral icterus.    Conjunctiva/sclera: Conjunctivae normal.  Pulmonary:     Effort: Pulmonary effort is normal.  Musculoskeletal:        General: Normal range of motion.     Cervical back: Neck supple.  Skin:    General: Skin is warm and dry.     Findings: Rash present.     Comments: Has slightly raised patches # 2( nicle and 4cm x 7 cm)  on inner R thigh and nicle size on the L inner this ]. They are not hot. Borders are smooth. No induration.   Neurological:     Mental Status: She is alert and oriented to person, place, and time.     Gait: Gait normal.  Psychiatric:        Mood and Affect: Mood normal.        Behavior: Behavior normal.        Thought Content: Thought content normal.        Judgment: Judgment normal.      UC Treatments / Results  Labs (all labs ordered are listed, but only abnormal results are displayed) Labs Reviewed - No data to display  EKG   Radiology No results found.  Procedures Procedures (including critical care time)  Medications Ordered in UC Medications - No data to display  Initial Impression / Assessment and Plan / UC Course  I have reviewed the triage vital signs and the nursing notes. Insect bite with local reaction. Placed on Triamcinolone 0.15 cream to apply bid x 7 days, if not better by 3 days, then may fill Rx I handed her for Medrol dose pack.    Final Clinical Impressions(s) / UC Diagnoses   Final diagnoses:  Insect bite of left lower leg, initial encounter   Discharge Instructions   None    ED Prescriptions    Medication Sig Dispense Auth. Provider   triamcinolone cream (KENALOG) 0.1 % Apply 1 application on to the skin 2 (two) times daily. 30 g Rodriguez-Southworth, Sunday Spillers, PA-C   methylPREDNISolone (MEDROL DOSEPAK) 4 MG TBPK tablet Take as directed if the cream does not help in 3 days 21 tablet Rodriguez-Southworth, Sunday Spillers, PA-C     PDMP not reviewed this encounter.   Shelby Mattocks,  PA-C 10/09/20 1426

## 2020-10-09 NOTE — ED Triage Notes (Addendum)
Pt st she thinks she got bitten by an insect last Sunday when she was in the woods with her sister. Pt presents with large red spots on inner thighs. 2 on right leg and 1 on Left leg. Pt st they hurt but they do not itch.pt st she has not tried any otc creams or medications.

## 2020-10-20 ENCOUNTER — Other Ambulatory Visit (HOSPITAL_BASED_OUTPATIENT_CLINIC_OR_DEPARTMENT_OTHER): Payer: Self-pay

## 2020-10-20 MED FILL — Clopidogrel Bisulfate Tab 75 MG (Base Equiv): ORAL | 90 days supply | Qty: 90 | Fill #0 | Status: AC

## 2020-10-20 MED FILL — Pantoprazole Sodium EC Tab 40 MG (Base Equiv): ORAL | 90 days supply | Qty: 90 | Fill #0 | Status: AC

## 2020-10-20 MED FILL — Diltiazem HCl Coated Beads Cap ER 24HR 180 MG: ORAL | 90 days supply | Qty: 90 | Fill #0 | Status: AC

## 2020-11-03 ENCOUNTER — Other Ambulatory Visit (HOSPITAL_COMMUNITY): Payer: Self-pay

## 2020-11-03 MED FILL — Rosuvastatin Calcium Tab 10 MG: ORAL | 90 days supply | Qty: 90 | Fill #0 | Status: AC

## 2020-11-10 DIAGNOSIS — D2271 Melanocytic nevi of right lower limb, including hip: Secondary | ICD-10-CM | POA: Diagnosis not present

## 2020-11-10 DIAGNOSIS — L814 Other melanin hyperpigmentation: Secondary | ICD-10-CM | POA: Diagnosis not present

## 2020-11-10 DIAGNOSIS — Z85828 Personal history of other malignant neoplasm of skin: Secondary | ICD-10-CM | POA: Diagnosis not present

## 2020-11-10 DIAGNOSIS — L578 Other skin changes due to chronic exposure to nonionizing radiation: Secondary | ICD-10-CM | POA: Diagnosis not present

## 2020-11-10 DIAGNOSIS — L821 Other seborrheic keratosis: Secondary | ICD-10-CM | POA: Diagnosis not present

## 2020-11-10 DIAGNOSIS — D225 Melanocytic nevi of trunk: Secondary | ICD-10-CM | POA: Diagnosis not present

## 2020-12-01 ENCOUNTER — Emergency Department
Admission: EM | Admit: 2020-12-01 | Discharge: 2020-12-01 | Disposition: A | Discharge status: Home / Self Care | Payer: 59 | Source: Home / Self Care

## 2020-12-01 ENCOUNTER — Other Ambulatory Visit (HOSPITAL_BASED_OUTPATIENT_CLINIC_OR_DEPARTMENT_OTHER): Payer: Self-pay

## 2020-12-01 DIAGNOSIS — M62838 Other muscle spasm: Secondary | ICD-10-CM

## 2020-12-01 DIAGNOSIS — M542 Cervicalgia: Secondary | ICD-10-CM

## 2020-12-01 MED ORDER — BACLOFEN 10 MG PO TABS
10.0000 mg | ORAL_TABLET | Freq: Three times a day (TID) | ORAL | 0 refills | Status: DC
  Filled 2020-12-01: qty 30, 10d supply, fill #0

## 2020-12-01 NOTE — ED Provider Notes (Signed)
[name]    CSN: [id number] Arrival date & time: 12/01/20  1127      History   Chief Complaint Chief Complaint  Patient presents with   Back Pain   Neck Pain    Rt side    HPI Rhonda [name] is a 70 y.o. female.   HPI 70 year old female presents with patient complains of right-sided neck pain and back pain for 2 days.  Patient reports back is been freezing up with muscle tightness and dull pain.  Patient reports that she has been under a a lot of stress recently.  Patient denies injury or insult to either neck or back.  Patient reports that she is training for a half marathon and is running 4.5-5 miles every other day.  Additionally, patient reports she is going through divorce with her husband after being married for 37-38 years and is moving to the [place], [place] area to take care of her elderly Mother who is 95.  Past Medical History:  Diagnosis Date   Achilles tendinitis of right lower extremity 06/19/2014   US showed minor changes in December 15  Most sxs seem to be enthesopathy  Note had reaction to NTG in past  Overview:  Overview:  US showed minor changes in December 15  Most sxs seem to be enthesopathy  Note had reaction to NTG in past  Last Assessment & Plan:  Recent irritation in the race appears to be a partial plantaris tear  This continues to give her some Achilles symptoms  I think a period   ALLERGIC RHINITIS 10/12/2006   Qualifier: Diagnosis of  By: McDiarmid MD, Todd     Allergic rhinitis 10/12/2006   Overview:  Overview:  Qualifier: Diagnosis of  By: McDiarmid MD, Cyndie Chime of this note might be different from the original. Overview:  Qualifier: Diagnosis of  By: McDiarmid MD, Todd   Ankle pain 08/17/2011   Asthma 10/12/2006   Qualifier: Diagnosis of  By: McDiarmid MD, Sherren Mocha    Overview:  Overview:  Qualifier: Diagnosis of  By: McDiarmid MD, Todd    Benign paroxysmal positional vertigo 11/13/2013   Chest pain in adult 08/17/2011   This has worrisome  features particularly with her level of exertion    Exercise-induced asthma 05/24/2018   First degree AV block 03/21/2018   Gastroesophageal reflux disease without esophagitis 07/23/2018   HAMMER TOE, OTHER, ACQUIRED 10/13/2006   Qualifier: Diagnosis of  By: McDiarmid MD, Sherren Mocha    Overview:  Overview:  Qualifier: Diagnosis of  By: McDiarmid MD, Todd    High cholesterol    Hyperlipidemia    Hyperlipidemia LDL goal <130 10/24/2013   Injury of plantaris muscle or tendon 10/30/2014   Insomnia 11/13/2013   Knee pain, left 06/15/2017   Left hip pain 11/04/2019   LEG PAIN, RIGHT 09/22/2009   Qualifier: Diagnosis of  By: Oneida Alar MD, KARL     Lichen sclerosus of female genitalia 07/24/2019   Metatarsal stress fracture 08/29/2012   This is at the distal shaft of the fifth metatarsal left foot    Mild intermittent asthma without complication [id number]   Muscle tear 11/15/2012   Left rectus femoris; also like tendon tear    Numbness and tingling of foot 03/29/2012   Patellar tendinitis 03/07/2013   I suspect this occurred because she did less crosstraining before this marathon and was probably hamstring dominant    Right foot pain 04/06/2016   Right knee pain 04/23/2013  04/23/13  ultrasound today RT knee  revealed no significant effusion The quadriceps tendon is intact as is the patellar tendon The medial meniscus was normal The lateral meniscus had an area of calcification and a very small area of splitting along the midline to the posterior third of the joint line    Rotator cuff tendinitis, left 09/08/2016   Overview:  Last Assessment & Plan:  Significant improvement  See OV summary   Sensorineural hearing loss 11/13/2013   Stress fracture of pelvis 08/29/2007   Qualifier: History of  By: Drue Flirt  MD, Taineisha     STRESS FRACTURE, TIBIA 06/15/2009   Qualifier: Diagnosis of  By: Drue Flirt  MD, Taineisha     Thrombocytopenia ([place]) 01/30/2014   Tibialis tendinitis 08/12/2009   Qualifier: Diagnosis of  By: Oneida Alar MD,  KARL     Tinnitus 11/13/2013   TROCHANTERIC BURSITIS, LEFT 04/27/2010   Qualifier: Diagnosis of  By: Ernestina Patches MD, Alberteen Spindle LEG LENGTH 09/18/2008   Qualifier: Diagnosis of  By: Oneida Alar MD, KARL     Wrist pain, acute, left 04/23/2019   US shows small fracture off tip of lister's tubercle Extensor comp 4 strain    Patient Active Problem List   Diagnosis Date Noted   High cholesterol    Hyperlipidemia    Left hip pain [id number]   Lichen sclerosus of female genitalia 07/24/2019   Wrist pain, acute, left 04/23/2019   Gastroesophageal reflux disease without esophagitis 07/23/2018   Exercise-induced asthma 05/24/2018   First degree AV block 03/21/2018   Knee pain, left 06/15/2017   Rotator cuff tendinitis, left 09/08/2016   Right foot pain 04/06/2016   Injury of plantaris muscle or tendon 10/30/2014   Achilles tendinitis of right lower extremity 06/19/2014   Thrombocytopenia ([place]) 01/30/2014   Benign paroxysmal positional vertigo 11/13/2013   Insomnia 11/13/2013   Sensorineural hearing loss 11/13/2013   Tinnitus 11/13/2013   Hyperlipidemia LDL goal <130 10/24/2013   Mild intermittent asthma without complication [id number]   Right knee pain 04/23/2013   Patellar tendinitis 03/07/2013   Muscle tear 11/15/2012   Metatarsal stress fracture 08/29/2012   Numbness and tingling of foot 03/29/2012   Ankle pain 08/17/2011   Chest pain in adult 08/17/2011   TROCHANTERIC BURSITIS, LEFT 04/27/2010   LEG PAIN, RIGHT 09/22/2009   TIBIALIS TENDINITIS 08/12/2009   STRESS FRACTURE, TIBIA 06/15/2009   UNEQUAL LEG LENGTH 09/18/2008   CARCINOMA, BASAL CELL 02/07/2008   Malignant neoplasm of female breast ([place]) 02/07/2008   STRESS FRACTURE OF PELVIS 08/29/2007   HAMMER TOE, OTHER, ACQUIRED 10/13/2006   ALLERGIC RHINITIS 10/12/2006   Asthma 10/12/2006   Allergic rhinitis 10/12/2006    Past Surgical History:  Procedure Laterality Date   ABDOMINAL SURGERY     ABDOMINAL SURGERY     for  removal of abdominal mass-cancerous   KNEE SURGERY     MANDIBLE FRACTURE SURGERY     NASAL SINUS SURGERY     TONSILLECTOMY      OB History   No obstetric history on file.      Home Medications    Prior to Admission medications   Medication Sig Start Date End Date Taking? Authorizing Provider  baclofen (LIORESAL) 10 MG tablet Take 1 tablet (10 mg total) by mouth 3 (three) times daily. 12/01/20  Yes Eliezer Lofts, FNP  albuterol (PROVENTIL HFA) 108 (90 Base) MCG/ACT inhaler Inhale 2 puffs into the lungs every 6 (six) hours as needed for wheezing. 11/01/19  Collene Gobble, MD  clobetasol ointment (TEMOVATE) [id number] % Apply 1 application topically daily as needed. Patient not taking: Reported on 12/01/2020 04/13/20   [provider]  clobetasol ointment (TEMOVATE) 0.05 % APPLY TO THE AFFECTED AREA(S) ONCE A DAY Patient not taking: Reported on 12/01/2020 04/13/20 04/13/21  Molli Posey, MD  clopidogrel (PLAVIX) 75 MG tablet TAKE 1 TABLET (75 MG TOTAL) BY MOUTH DAILY. 07/28/20 07/28/21  Richardo Priest, MD  COVID-19 mRNA Vac-TriS, Pfizer, (PFIZER-BIONT COVID-19 VAC-TRIS) SUSP injection Inject into the muscle. 08/11/20   Carlyle Basques, MD  diltiazem (CARDIZEM CD) 180 MG 24 hr capsule TAKE 1 CAPSULE (180 MG TOTAL) BY MOUTH DAILY. 07/28/20 07/28/21  Richardo Priest, MD  doxycycline (VIBRAMYCIN) 100 MG capsule Take 1 capsule (100 mg total) by mouth 2 (two) times daily for 7 days. Patient not taking: Reported on 12/01/2020 08/17/20     loratadine (CLARITIN) 10 MG tablet Take 1 tablet by mouth daily.    [provider]  methylPREDNISolone (MEDROL DOSEPAK) 4 MG TBPK tablet Take as directed if the cream does not help in 3 days Patient not taking: Reported on 12/01/2020 10/13/20   Rodriguez-Southworth, Sunday Spillers, PA-C  pantoprazole (PROTONIX) 40 MG tablet TAKE 1 TABLET (40 MG TOTAL) BY MOUTH DAILY. 07/28/20 07/28/21  Richardo Priest, MD  rosuvastatin (CRESTOR) 10 MG tablet Take 10 mg by mouth  daily. 01/31/20   [provider]  rosuvastatin (CRESTOR) 10 MG tablet TAKE 1 TABLET BY MOUTH DAILY. 11/13/19 11/12/20  Loraine Leriche., MD  rosuvastatin (CRESTOR) 10 MG tablet TAKE 1 TABLET BY MOUTH DAILY. Patient not taking: Reported on 12/01/2020 01/31/20 02/01/21  Loraine Leriche., MD  triamcinolone cream (KENALOG) 0.1 % Apply 1 application on to the skin 2 (two) times daily. Patient not taking: Reported on 12/01/2020 10/09/20   Rodriguez-Southworth, Sunday Spillers, PA-C  metoprolol tartrate (LOPRESSOR) 100 MG tablet Take 1 tablet (100 mg total) by mouth once for 1 dose. Take two hours prior to your cardiac CT 03/27/20 04/29/20  Richardo Priest, MD  omeprazole (PRILOSEC) 20 MG capsule Take 1 capsule (20 mg total) by mouth 2 (two) times daily. 04/30/20 07/28/20  Richardo Priest, MD    Family History Family History  Problem Relation Age of Onset   COPD Mother    Pulmonary fibrosis Father    Parkinson's disease Father    Diabetes Father    Thyroid disease Sister    Diabetes Sister    Heart attack Neg Hx     Social History Social History   Tobacco Use   Smoking status: Never   Smokeless tobacco: Never  Vaping Use   Vaping Use: Never used  Substance Use Topics   Alcohol use: Yes    Comment: occ glass of wine   Drug use: No     Allergies   Acetaminophen, Aspirin, Nsaids, and Pravastatin   Review of Systems Review of Systems  Musculoskeletal:  Positive for neck pain and neck stiffness.    Physical Exam Triage Vital Signs ED Triage Vitals  Enc Vitals Group     BP 12/01/20 1141 127/78     Pulse Rate 12/01/20 1141 69     Resp 12/01/20 1141 14     Temp 12/01/20 1141 98.1 F (36.7 C)     Temp Source 12/01/20 1141 Oral     SpO2 12/01/20 1141 99 %     Weight 12/01/20 1144 131 lb (59.4 kg)     Height 12/01/20 1144 5' (1.524  m)     Head Circumference --      Peak Flow --      Pain Score 12/01/20 1142 5     Pain Loc --      Pain Edu? --      Excl. in [place]? --     No data found.  Updated Vital Signs BP 127/78 (BP Location: Left Arm)   Pulse 69   Temp 98.1 F (36.7 C) (Oral)   Resp 14   Ht 5' (1.524 m)   Wt 131 lb (59.4 kg)   SpO2 99%   BMI 25.58 kg/m   Physical Exam Vitals and nursing note reviewed.  Constitutional:      General: She is not in acute distress.    Appearance: She is normal weight. She is not ill-appearing.  HENT:     Head: Normocephalic and atraumatic.     Mouth/Throat:     Mouth: Mucous membranes are moist.     Pharynx: Oropharynx is clear.  Eyes:     Extraocular Movements: Extraocular movements intact.     Conjunctiva/sclera: Conjunctivae normal.     Pupils: Pupils are equal, round, and reactive to light.  Neck:     Comments: Limited range of motion with 4 planes of movement, TTP over inferior bilateral splenius muscles, bilateral trapezius muscles, and right sided superior rhomboid group with multiple palpable muscle adhesions of these areas noted. Cardiovascular:     Rate and Rhythm: Normal rate and regular rhythm.     Pulses: Normal pulses.     Heart sounds: Normal heart sounds.  Pulmonary:     Effort: Pulmonary effort is normal.     Breath sounds: Normal breath sounds. No stridor. No wheezing, rhonchi or rales.  Musculoskeletal:        General: No swelling, tenderness, deformity or signs of injury. Normal range of motion.     Cervical back: Neck supple.  Skin:    General: Skin is warm and dry.  Neurological:     General: No focal deficit present.     Mental Status: She is alert and oriented to person, place, and time.  Psychiatric:        Mood and Affect: Mood normal.        Behavior: Behavior normal.        Thought Content: Thought content normal.     UC Treatments / Results  Labs (all labs ordered are listed, but only abnormal results are displayed) Labs Reviewed - No data to display  EKG   Radiology No results found.  Procedures Procedures (including critical care time)  Medications  Ordered in UC Medications - No data to display  Initial Impression / Assessment and Plan / UC Course  I have reviewed the triage vital signs and the nursing notes.  Pertinent labs & imaging results that were available during my care of the patient were reviewed by me and considered in my medical decision making (see chart for details).    MDM: 1.  Neck pain-advised patient to use OTC Ibuprofen 200 to 400 mg 1-2 times daily, as needed for the next 3 to 5 days, 2.  Trapezius muscle spasm-Rx'd Baclofen, courage patient after initial pain and tightness resolves to gradually begin gentle stretching exercises for neck and upper back muscles.  Patient discharged home, hemodynamically stable. Final Clinical Impressions(s) / UC Diagnoses   Final diagnoses:  Neck pain  Trapezius muscle spasm     Discharge Instructions      Advised  patient to take medication as directed.  Encourage patient to increase daily water intake while taking this medication and to avoid moderate to strenuous activities, including repetitive movement activities involving affected areas of neck and right-sided upper back.     ED Prescriptions     Medication Sig Dispense Auth. Provider   baclofen (LIORESAL) 10 MG tablet Take 1 tablet (10 mg total) by mouth 3 (three) times daily. 29 each Eliezer Lofts, FNP      PDMP not reviewed this encounter.   Eliezer Lofts, [place] 12/01/20 1238

## 2020-12-01 NOTE — Discharge Instructions (Addendum)
Advised patient to take medication as directed.  Encourage patient to increase daily water intake while taking this medication and to avoid moderate to strenuous activities, including repetitive movement activities involving affected areas of neck and right-sided upper back.

## 2020-12-01 NOTE — ED Triage Notes (Signed)
Pt seen in UC w/ c/o right side neck and back pain. Pt states that her back has been "freezing up with muscle tightness and dull pain" Pt shares that she has recently been under a lot of stress. Pt has taken one dose of tylenol for her pain, last dose last night. Pt declines tylenol at this time due to previous respiratory reaction to tylenol.

## 2020-12-04 DIAGNOSIS — H5213 Myopia, bilateral: Secondary | ICD-10-CM | POA: Diagnosis not present

## 2020-12-04 DIAGNOSIS — H52203 Unspecified astigmatism, bilateral: Secondary | ICD-10-CM | POA: Diagnosis not present

## 2020-12-04 DIAGNOSIS — H524 Presbyopia: Secondary | ICD-10-CM | POA: Diagnosis not present

## 2020-12-04 DIAGNOSIS — H2513 Age-related nuclear cataract, bilateral: Secondary | ICD-10-CM | POA: Diagnosis not present

## 2021-01-04 ENCOUNTER — Other Ambulatory Visit (HOSPITAL_BASED_OUTPATIENT_CLINIC_OR_DEPARTMENT_OTHER): Payer: Self-pay

## 2021-01-04 DIAGNOSIS — L309 Dermatitis, unspecified: Secondary | ICD-10-CM | POA: Diagnosis not present

## 2021-01-04 MED ORDER — BETAMETHASONE DIPROPIONATE AUG 0.05 % EX CREA
TOPICAL_CREAM | CUTANEOUS | 0 refills | Status: DC
  Filled 2021-01-04: qty 30, 15d supply, fill #0

## 2021-01-15 ENCOUNTER — Other Ambulatory Visit (HOSPITAL_COMMUNITY): Payer: Self-pay

## 2021-01-15 ENCOUNTER — Other Ambulatory Visit: Payer: Self-pay | Admitting: Emergency Medicine

## 2021-01-15 MED FILL — Rosuvastatin Calcium Tab 10 MG: ORAL | 90 days supply | Qty: 90 | Fill #1 | Status: CN

## 2021-01-15 MED FILL — Diltiazem HCl Coated Beads Cap ER 24HR 180 MG: ORAL | 90 days supply | Qty: 90 | Fill #0 | Status: AC

## 2021-01-15 MED FILL — Clopidogrel Bisulfate Tab 75 MG (Base Equiv): ORAL | 90 days supply | Qty: 90 | Fill #0 | Status: AC

## 2021-01-15 MED FILL — Pantoprazole Sodium EC Tab 40 MG (Base Equiv): ORAL | 90 days supply | Qty: 90 | Fill #0 | Status: AC

## 2021-01-15 MED FILL — Rosuvastatin Calcium Tab 10 MG: ORAL | 90 days supply | Qty: 90 | Fill #1 | Status: AC

## 2021-01-17 MED ORDER — ALBUTEROL SULFATE HFA 108 (90 BASE) MCG/ACT IN AERS
2.0000 | INHALATION_SPRAY | Freq: Four times a day (QID) | RESPIRATORY_TRACT | 2 refills | Status: AC | PRN
  Filled 2021-01-17: qty 8.5, 25d supply, fill #0
  Filled 2021-08-05: qty 8.5, 25d supply, fill #1

## 2021-01-18 ENCOUNTER — Other Ambulatory Visit (HOSPITAL_COMMUNITY): Payer: Self-pay

## 2021-01-29 DIAGNOSIS — Z0000 Encounter for general adult medical examination without abnormal findings: Secondary | ICD-10-CM | POA: Diagnosis not present

## 2021-01-29 DIAGNOSIS — R7309 Other abnormal glucose: Secondary | ICD-10-CM | POA: Diagnosis not present

## 2021-01-29 DIAGNOSIS — J4520 Mild intermittent asthma, uncomplicated: Secondary | ICD-10-CM | POA: Diagnosis not present

## 2021-01-29 DIAGNOSIS — K219 Gastro-esophageal reflux disease without esophagitis: Secondary | ICD-10-CM | POA: Diagnosis not present

## 2021-01-29 DIAGNOSIS — Z131 Encounter for screening for diabetes mellitus: Secondary | ICD-10-CM | POA: Diagnosis not present

## 2021-01-29 DIAGNOSIS — I2510 Atherosclerotic heart disease of native coronary artery without angina pectoris: Secondary | ICD-10-CM | POA: Diagnosis not present

## 2021-01-29 DIAGNOSIS — Z79899 Other long term (current) drug therapy: Secondary | ICD-10-CM | POA: Diagnosis not present

## 2021-01-29 DIAGNOSIS — E785 Hyperlipidemia, unspecified: Secondary | ICD-10-CM | POA: Diagnosis not present

## 2021-01-29 DIAGNOSIS — Z7902 Long term (current) use of antithrombotics/antiplatelets: Secondary | ICD-10-CM | POA: Diagnosis not present

## 2021-02-01 ENCOUNTER — Ambulatory Visit: Payer: 59 | Admitting: Cardiology

## 2021-02-01 ENCOUNTER — Encounter: Payer: Self-pay | Admitting: Cardiology

## 2021-02-01 ENCOUNTER — Other Ambulatory Visit: Payer: Self-pay

## 2021-02-01 VITALS — BP 122/64 | HR 72 | Ht 60.0 in | Wt 135.0 lb

## 2021-02-01 DIAGNOSIS — J4520 Mild intermittent asthma, uncomplicated: Secondary | ICD-10-CM

## 2021-02-01 DIAGNOSIS — E782 Mixed hyperlipidemia: Secondary | ICD-10-CM | POA: Diagnosis not present

## 2021-02-01 DIAGNOSIS — I25118 Atherosclerotic heart disease of native coronary artery with other forms of angina pectoris: Secondary | ICD-10-CM

## 2021-02-01 NOTE — Progress Notes (Signed)
Cardiology Office Note:    Date:  02/01/2021   ID:  Rhonda [name], DOB 1950-12-10, MRN [id number]  PCP:  Loraine Leriche., MD  Cardiologist:  Shirlee More, MD    Referring MD: Loraine Leriche.,*    ASSESSMENT:    1. Coronary artery disease of native artery of native heart with stable angina pectoris ([place])   2. Mixed hyperlipidemia   3. Mild intermittent asthma without complication    PLAN:    In order of problems listed above:  She continues to do well asymptomatic with CAD New York Heart Association class I continue medical therapy clopidogrel statin and I encouraged her to resume her regular exercise program. Lipids at target continue high intensity statin and co-Q10 this been beneficial to mitigate muscle symptoms lipids are at target Stable continue her current bronchodilator   Next appointment: 6 months at her request   Medication Adjustments/Labs and Tests Ordered: Current medicines are reviewed at length with the patient today.  Concerns regarding medicines are outlined above.  No orders of the defined types were placed in this encounter.  No orders of the defined types were placed in this encounter.   Chief Complaint  Patient presents with   Follow-up   Coronary Artery Disease   Hyperlipidemia     History of Present Illness:    Rhonda [name] is a 70 y.o. female with a hx of CAD hyperlipidemia asthma and GERD last seen 07/28/2020.Her coronary CTA showed a calcium score of 66 which was 50th percentile for age and sex moderate stenosis in the LAD at the origin of the first diagonal branch 50 to 69%.   Compliance with diet, lifestyle and medications: Yes She tolerates clopidogrel without bleeding or bruising and lipid-lowering statin without muscle pain or weakness since she started taking over-the-counter co-Q10 200 units daily. She never started a running program she has a lot of change in her personal life retires at the end of this week is  in the process of divorce and is [place] the [place].  She plans to keep her local medical care. Asthma is quiet no wheezing shortness of breath chest pain palpitation or syncope. Past Medical History:  Diagnosis Date   Achilles tendinitis of right lower extremity 06/19/2014   US showed minor changes in December 15  Most sxs seem to be enthesopathy  Note had reaction to NTG in past  Overview:  Overview:  US showed minor changes in December 15  Most sxs seem to be enthesopathy  Note had reaction to NTG in past  Last Assessment & Plan:  Recent irritation in the race appears to be a partial plantaris tear  This continues to give her some Achilles symptoms  I think a period   ALLERGIC RHINITIS 10/12/2006   Qualifier: Diagnosis of  By: McDiarmid MD, Todd     Allergic rhinitis 10/12/2006   Overview:  Overview:  Qualifier: Diagnosis of  By: McDiarmid MD, Cyndie Chime of this note might be different from the original. Overview:  Qualifier: Diagnosis of  By: McDiarmid MD, Todd   Ankle pain 08/17/2011   Asthma 10/12/2006   Qualifier: Diagnosis of  By: McDiarmid MD, Sherren Mocha    Overview:  Overview:  Qualifier: Diagnosis of  By: McDiarmid MD, Todd    Benign paroxysmal positional vertigo 11/13/2013   Chest pain in adult 08/17/2011   This has worrisome features particularly with her level of exertion    Exercise-induced asthma  05/24/2018   First degree AV block 03/21/2018   Gastroesophageal reflux disease without esophagitis 07/23/2018   HAMMER TOE, OTHER, ACQUIRED 10/13/2006   Qualifier: Diagnosis of  By: McDiarmid MD, Sherren Mocha    Overview:  Overview:  Qualifier: Diagnosis of  By: McDiarmid MD, Todd    High cholesterol    Hyperlipidemia    Hyperlipidemia LDL goal <130 10/24/2013   Injury of plantaris muscle or tendon 10/30/2014   Insomnia 11/13/2013   Knee pain, left 06/15/2017   Left hip pain 11/04/2019   LEG PAIN, RIGHT 09/22/2009   Qualifier: Diagnosis of  By: Oneida Alar MD, KARL     Lichen  sclerosus of female genitalia 07/24/2019   Metatarsal stress fracture 08/29/2012   This is at the distal shaft of the fifth metatarsal left foot    Mild intermittent asthma without complication 09/02/621   Muscle tear 11/15/2012   Left rectus femoris; also like tendon tear    Numbness and tingling of foot 03/29/2012   Patellar tendinitis 03/07/2013   I suspect this occurred because she did less crosstraining before this marathon and was probably hamstring dominant    Right foot pain 04/06/2016   Right knee pain 04/23/2013   04/23/13  ultrasound today RT knee  revealed no significant effusion The quadriceps tendon is intact as is the patellar tendon The medial meniscus was normal The lateral meniscus had an area of calcification and a very small area of splitting along the midline to the posterior third of the joint line    Rotator cuff tendinitis, left 09/08/2016   Overview:  Last Assessment & Plan:  Significant improvement  See OV summary   Sensorineural hearing loss 11/13/2013   Stress fracture of pelvis 08/29/2007   Qualifier: History of  By: Drue Flirt  MD, [place], TIBIA 06/15/2009   Qualifier: Diagnosis of  By: Drue Flirt  MD, Taineisha     Thrombocytopenia ([place]) 01/30/2014   Tibialis tendinitis 08/12/2009   Qualifier: Diagnosis of  By: Oneida Alar MD, KARL     Tinnitus 11/13/2013   TROCHANTERIC BURSITIS, LEFT 04/27/2010   Qualifier: Diagnosis of  By: Ernestina Patches MD, Alberteen Spindle LEG LENGTH 09/18/2008   Qualifier: Diagnosis of  By: Oneida Alar MD, KARL     Wrist pain, acute, left 04/23/2019   US shows small fracture off tip of lister's tubercle Extensor comp 4 strain    Past Surgical History:  Procedure Laterality Date   ABDOMINAL SURGERY     ABDOMINAL SURGERY     for removal of abdominal mass-cancerous   KNEE SURGERY     MANDIBLE FRACTURE SURGERY     NASAL SINUS SURGERY     TONSILLECTOMY      Current Medications: Current Meds  Medication Sig   albuterol (PROVENTIL HFA) 108  (90 Base) MCG/ACT inhaler Inhale 2 puffs into the lungs every 6 (six) hours as needed for wheezing.   clopidogrel (PLAVIX) 75 MG tablet TAKE 1 TABLET (75 MG TOTAL) BY MOUTH DAILY.   diltiazem (CARDIZEM CD) 180 MG 24 hr capsule TAKE 1 CAPSULE (180 MG TOTAL) BY MOUTH DAILY.   loratadine (CLARITIN) 10 MG tablet Take 1 tablet by mouth daily.   pantoprazole (PROTONIX) 40 MG tablet TAKE 1 TABLET (40 MG TOTAL) BY MOUTH DAILY.   rosuvastatin (CRESTOR) 10 MG tablet TAKE 1 TABLET BY MOUTH DAILY.     Allergies:   Acetaminophen, Aspirin, Nsaids, and Pravastatin   Social History   Socioeconomic History  Marital status: Married    Spouse name: Not on file   Number of children: Not on file   Years of education: Not on file   Highest education level: Not on file  Occupational History   Not on file  Tobacco Use   Smoking status: Never   Smokeless tobacco: Never  Vaping Use   Vaping Use: Never used  Substance and Sexual Activity   Alcohol use: Yes    Comment: occ glass of wine   Drug use: No   Sexual activity: Not on file  Other Topics Concern   Not on file  Social History Narrative   Not on file   Social Determinants of Health   Financial Resource Strain: Not on file  Food Insecurity: Not on file  Transportation Needs: Not on file  Physical Activity: Not on file  Stress: Not on file  Social Connections: Not on file     Family History: The patient's family history includes COPD in her mother; Diabetes in her father and sister; Parkinson's disease in her father; Pulmonary fibrosis in her father; Thyroid disease in her sister. There is no history of Heart attack. ROS:   Please see the history of present illness.    All other systems reviewed and are negative.  EKGs/Labs/Other Studies Reviewed:    The following studies were reviewed today:   Recent Labs: 04/15/2020: BUN 10; Creatinine, Ser 0.79; Potassium 4.4; Sodium 142   Recent Lipid Panel 01/29/2021: LDL cholesterol 54  total cholesterol 142 triglycerides 81 HDL 72 07/27/2020: Hemoglobin 13.2 hematocrit 39 Cholesterol 144 HDL 72 LDL 55 Sodium 140 potassium 4.2 creatinine 0.80 A1c 5.6%  Physical Exam:    VS:  BP 122/64 (BP Location: Right Arm, Patient Position: Sitting, Cuff Size: Normal)   Pulse 72   Ht 5' (1.524 m)   Wt 135 lb (61.2 kg)   SpO2 98%   BMI 26.37 kg/m     Wt Readings from Last 3 Encounters:  02/01/21 135 lb (61.2 kg)  12/01/20 131 lb (59.4 kg)  10/09/20 132 lb (59.9 kg)     GEN:  Well nourished, well developed in no acute distress HEENT: Normal NECK: No JVD; No carotid bruits LYMPHATICS: No lymphadenopathy CARDIAC: RRR, no murmurs, rubs, gallops RESPIRATORY:  Clear to auscultation without rales, wheezing or rhonchi  ABDOMEN: Soft, non-tender, non-distended MUSCULOSKELETAL:  No edema; No deformity  SKIN: Warm and dry NEUROLOGIC:  Alert and oriented x 3 PSYCHIATRIC:  Normal affect    Signed, Shirlee More, MD  02/01/2021 8:21 AM    [facility]

## 2021-02-01 NOTE — Patient Instructions (Signed)

## 2021-02-02 ENCOUNTER — Encounter: Payer: Self-pay | Admitting: Emergency Medicine

## 2021-02-02 ENCOUNTER — Ambulatory Visit: Payer: 59 | Admitting: Emergency Medicine

## 2021-02-02 DIAGNOSIS — K219 Gastro-esophageal reflux disease without esophagitis: Secondary | ICD-10-CM

## 2021-02-02 DIAGNOSIS — J4520 Mild intermittent asthma, uncomplicated: Secondary | ICD-10-CM | POA: Diagnosis not present

## 2021-02-02 NOTE — Assessment & Plan Note (Signed)
Continue loratadine as she has been taking it.

## 2021-02-02 NOTE — Assessment & Plan Note (Signed)
Upper airway irritation is well controlled.  She is interested in trying to come off the pantoprazole.  I agree with this plan.  She will change it to every other day for a couple weeks and then try to stop altogether.  If she develops increased GERD, increased upper airway irritation then she may need to go back on it.  Discussed this with her today.

## 2021-02-02 NOTE — Progress Notes (Signed)
Subjective:    Patient ID: Rhonda [name], female    DOB: 1951-02-28, 70 y.o.   MRN: [id number]  Asthma There is no cough, shortness of breath or wheezing. Pertinent negatives include no ear pain, fever, headaches, postnasal drip, rhinorrhea, sneezing, sore throat or trouble swallowing. Her past medical history is significant for asthma.     ROV 07/27/20 --follow-up visit for mild intermittent asthma and chronic upper airway irritation with associated cough.  She has a positive bronchodilator response and pretreats exercise consistently otherwise uses albuterol rarely.  Also on omeprazole, increased to bid since last time, and fluticasone as needed. Remains on loratadine-D.   Since last time she underwent coronary CT, calcium score 66 (58th percentile), showed moderate LAD stenosis at D1.  Plavix and calcium channel blocker were both added. She reports that she has been doing well >> exercise tolerance a bit better now. She is careful w certain foods that cause her GERD and then her asthma. Minimal albuterol since last time  ROV 02/02/2021 --70 year old woman with a history of mild intermittent asthma (positive bronchodilator response on spirometry), chronic upper airway irritation syndrome with associated cough in the setting of rhinitis, GERD. She is on Protonix, loratadine.  No maintenance bronchodilator regimen.  Has albuterol and reports that she uses She is doing quite well. She has only needed her albuterol 3x since I last saw her. She is still quite active, exercises every day. She is planning to run a marathon. She usually pre-treats her runs with albuterol especially in the Summer. No real cough, GERD controlled on PPI, she is interested in trying to come off of it  Ventolin > albuterol generic.    Review of Systems  Constitutional:  Negative for fever and unexpected weight change.  HENT:  Negative for congestion, dental problem, ear pain, nosebleeds, postnasal drip, rhinorrhea,  sinus pressure, sneezing, sore throat and trouble swallowing.   Eyes:  Negative for redness and itching.  Respiratory:  Negative for cough, chest tightness, shortness of breath and wheezing.   Cardiovascular:  Negative for palpitations and leg swelling.  Gastrointestinal:  Negative for nausea and vomiting.  Genitourinary:  Negative for dysuria.  Musculoskeletal:  Negative for joint swelling.  Skin:  Negative for rash.  Neurological:  Negative for headaches.  Hematological:  Does not bruise/bleed easily.  Psychiatric/Behavioral:  Negative for dysphoric mood. The patient is not nervous/anxious.       Objective:   Physical Exam Vitals:   02/02/21 1019  BP: 118/72  Pulse: 62  Temp: 98.3 F (36.8 C)  TempSrc: Oral  SpO2: 99%  Weight: 135 lb 9.6 oz (61.5 kg)  Height: 5' (1.524 m)    Gen: Pleasant, well-nourished, in no distress,  normal affect  ENT: No lesions,  mouth clear,  oropharynx clear, no postnasal drip  Neck: No JVD, no stridor  Lungs: No use of accessory muscles, no dullness to percussion, clear without rales or rhonchi  Cardiovascular: RRR, heart sounds normal, no murmur or gallops, no peripheral edema  Musculoskeletal: No deformities, no cyanosis or clubbing  Neuro: alert, non focal  Skin: Warm, no lesions or rash      Assessment & Plan:  Asthma Doing quite well.  Her breathing is not causing daily symptoms.  She remains very active.  She runs, trains for marathon.  She does pretreat her runs with albuterol especially in the summer.  Otherwise rare usage.  No flares.  She is usually on Ventolin but most recently was given generic  albuterol.  In the past the generic has not worked as well for her.  She is going to try it, but if it is not as effective then I want to get her back on the Ventolin.  Please continue to use your albuterol 2 puffs prior to exercise and also 2 puffs if needed for shortness of breath, chest tightness, wheezing.  If the generic  formulation is ineffective then I want to get you back on your usual Ventolin.  Please call and let us know. Follow with Dr Lamonte Sakai in 6 months or sooner if you have any problems  ALLERGIC RHINITIS Continue loratadine as she has been taking it.  Gastroesophageal reflux disease without esophagitis Upper airway irritation is well controlled.  She is interested in trying to come off the pantoprazole.  I agree with this plan.  She will change it to every other day for a couple weeks and then try to stop altogether.  If she develops increased GERD, increased upper airway irritation then she may need to go back on it.  Discussed this with her today.  Baltazar Apo, MD, PhD 02/02/2021, 10:42 AM [facility] [phone] or if no answer [phone]

## 2021-02-02 NOTE — Patient Instructions (Addendum)
Please continue to use your albuterol 2 puffs prior to exercise and also 2 puffs if needed for shortness of breath, chest tightness, wheezing.  If the generic formulation is ineffective then I want to get you back on your usual Ventolin.  Please call and let us know. Continue your loratadine as you have been taking it Agree with trying to come off your pantoprazole.  Keep track of how you are reflux, throat irritation, cough or doing after you stop the medication.  If they flare you may need to consider restarting it. Follow with Dr Lamonte Sakai in 6 months or sooner if you have any problems

## 2021-02-02 NOTE — Assessment & Plan Note (Signed)
Doing quite well.  Her breathing is not causing daily symptoms.  She remains very active.  She runs, trains for marathon.  She does pretreat her runs with albuterol especially in the summer.  Otherwise rare usage.  No flares.  She is usually on Ventolin but most recently was given generic albuterol.  In the past the generic has not worked as well for her.  She is going to try it, but if it is not as effective then I want to get her back on the Ventolin.  Please continue to use your albuterol 2 puffs prior to exercise and also 2 puffs if needed for shortness of breath, chest tightness, wheezing.  If the generic formulation is ineffective then I want to get you back on your usual Ventolin.  Please call and let us know. Follow with Dr Lamonte Sakai in 6 months or sooner if you have any problems

## 2021-02-03 DIAGNOSIS — E785 Hyperlipidemia, unspecified: Secondary | ICD-10-CM | POA: Diagnosis not present

## 2021-02-03 DIAGNOSIS — Z79899 Other long term (current) drug therapy: Secondary | ICD-10-CM | POA: Diagnosis not present

## 2021-02-03 DIAGNOSIS — I2510 Atherosclerotic heart disease of native coronary artery without angina pectoris: Secondary | ICD-10-CM | POA: Diagnosis not present

## 2021-02-03 DIAGNOSIS — J4520 Mild intermittent asthma, uncomplicated: Secondary | ICD-10-CM | POA: Diagnosis not present

## 2021-02-03 DIAGNOSIS — Z0000 Encounter for general adult medical examination without abnormal findings: Secondary | ICD-10-CM | POA: Diagnosis not present

## 2021-02-03 DIAGNOSIS — K219 Gastro-esophageal reflux disease without esophagitis: Secondary | ICD-10-CM | POA: Diagnosis not present

## 2021-02-03 DIAGNOSIS — Z7902 Long term (current) use of antithrombotics/antiplatelets: Secondary | ICD-10-CM | POA: Diagnosis not present

## 2021-02-23 NOTE — Telephone Encounter (Signed)
RB please advise on the pts email.  thanks

## 2021-02-23 NOTE — Telephone Encounter (Signed)
I think she should get the new booster - it offers more protection against the currently active strains of COVID 19

## 2021-02-23 NOTE — Telephone Encounter (Signed)
D. Byrum's message relayed via My Chart. Nothing further needed at this time.

## 2021-03-02 ENCOUNTER — Telehealth: Payer: Self-pay

## 2021-03-02 MED ORDER — DILTIAZEM HCL ER COATED BEADS 180 MG PO CP24: ORAL_CAPSULE | ORAL | 3 refills | Status: DC

## 2021-03-02 MED ORDER — CLOPIDOGREL BISULFATE 75 MG PO TABS: ORAL_TABLET | Freq: Every day | ORAL | 3 refills | Status: DC

## 2021-03-02 NOTE — Telephone Encounter (Signed)
New Rx request for Cartia XT 180 mg and Clopidogrel received from [facility].

## 2021-03-18 ENCOUNTER — Other Ambulatory Visit: Payer: Self-pay | Admitting: Obstetrics and Gynecology

## 2021-03-18 DIAGNOSIS — Z1231 Encounter for screening mammogram for malignant neoplasm of breast: Secondary | ICD-10-CM

## 2021-04-06 ENCOUNTER — Encounter: Payer: Self-pay | Admitting: Emergency Medicine

## 2021-04-06 NOTE — Telephone Encounter (Signed)
I've had some other patients benefit from changing to an alternative generic antihistamine > like fexofenadine or cetirizine. Would try one of these. If no better we could talk about other alternatives.

## 2021-04-06 NOTE — Telephone Encounter (Signed)
Please advise on the medication change

## 2021-04-19 ENCOUNTER — Other Ambulatory Visit: Payer: Self-pay

## 2021-04-19 MED ORDER — PANTOPRAZOLE SODIUM 40 MG PO TBEC: DELAYED_RELEASE_TABLET | Freq: Every day | ORAL | 3 refills | Status: DC

## 2021-05-27 ENCOUNTER — Ambulatory Visit: Payer: 59

## 2021-07-15 NOTE — Progress Notes (Signed)
Cardiology Office Note:    Date:  07/16/2021   ID:  Rhonda [name], DOB [birth year], MRN [id number]  PCP:  Loraine Leriche., MD  Cardiologist:  Shirlee More, MD    Referring MD: Loraine Leriche.,*    ASSESSMENT:    1. SOB (shortness of breath)   2. Coronary artery disease of native artery of native heart with stable angina pectoris ([place])   3. Mixed hyperlipidemia    PLAN:    In order of problems listed above:  I suspect her shortness of breath is a sequela of COVID infection and I agree she would benefit from pulmonary evaluation told her she may have ended up getting PFTs at least a chest x-ray perhaps high-resolution CT of the chest.  We will also do an echocardiogram I told her in my experience is uncommon to have cardiac injury not admitted to the hospital not seriously ill with respiratory failure and ICU care.  She is reassured. Stable CAD she is having no anginal discomfort continue her current medical treatment including lipid-lowering and antiplatelet no rate limiting calcium channel blocker with her asthma.   Next appointment: 6 months   Medication Adjustments/Labs and Tests Ordered: Current medicines are reviewed at length with the patient today.  Concerns regarding medicines are outlined above.  Orders Placed This Encounter  Procedures   EKG 12-Lead   ECHOCARDIOGRAM COMPLETE   No orders of the defined types were placed in this encounter.   I recently had COVID infection was quite ill treated as an outpatient with Paxil and now finding myself short of breath   History of Present Illness:    Rhonda [name] is a 71 y.o. female with a hx of CAD hyperlipidemia asthma and GERD last seen 07/28/2020.Her coronary CTA showed a calcium score of 66 which was 50th percentile for age and sex moderate stenosis in the LAD at the origin of the first diagonal branch 8 to 69%  last seen 02/01/2021.  Compliance with diet, lifestyle and medications: Yes  3  weeks ago showed COVID infection was quite symptomatic but not hypoxic treated with antiviral not admitted to the hospital. She had cough shortness of breath and is still short of breath with physical activity although she has started walking a little bit overwhelming. He has no edema orthopnea chest pain palpitation or syncope. This very stressful time in her life recently retired c relocated to take care of her multiple and is in the process of divorce She is seeing pulmonary in the next week and is quite concerned she may have had a cardiac injury from COVID infection Past Medical History:  Diagnosis Date   Achilles tendinitis of right lower extremity 06/19/2014   US showed minor changes in December 15  Most sxs seem to be enthesopathy  Note had reaction to NTG in past  Overview:  Overview:  US showed minor changes in December 15  Most sxs seem to be enthesopathy  Note had reaction to NTG in past  Last Assessment & Plan:  Recent irritation in the race appears to be a partial plantaris tear  This continues to give her some Achilles symptoms  I think a period   ALLERGIC RHINITIS 10/12/2006   Qualifier: Diagnosis of  By: McDiarmid MD, Todd     Allergic rhinitis 10/12/2006   Overview:  Overview:  Qualifier: Diagnosis of  By: McDiarmid MD, Cyndie Chime of this note might be different from the original. Overview:  Qualifier: Diagnosis of  By: McDiarmid MD, Todd   Ankle pain 08/17/2011   Asthma 10/12/2006   Qualifier: Diagnosis of  By: McDiarmid MD, Sherren Mocha    Overview:  Overview:  Qualifier: Diagnosis of  By: McDiarmid MD, Todd    Benign paroxysmal positional vertigo 11/13/2013   Chest pain in adult 08/17/2011   This has worrisome features particularly with her level of exertion    Exercise-induced asthma 05/24/2018   First degree AV block 03/21/2018   Gastroesophageal reflux disease without esophagitis 07/23/2018   HAMMER TOE, OTHER, ACQUIRED 10/13/2006   Qualifier: Diagnosis of  By: McDiarmid MD, Sherren Mocha     Overview:  Overview:  Qualifier: Diagnosis of  By: McDiarmid MD, Todd    High cholesterol    Hyperlipidemia    Hyperlipidemia LDL goal <130 10/24/2013   Injury of plantaris muscle or tendon 10/30/2014   Insomnia 11/13/2013   Knee pain, left 06/15/2017   Left hip pain 11/04/2019   LEG PAIN, RIGHT 09/22/2009   Qualifier: Diagnosis of  By: Oneida Alar MD, KARL     Lichen sclerosus of female genitalia 07/24/2019   Metatarsal stress fracture 08/29/2012   This is at the distal shaft of the fifth metatarsal left foot    Mild intermittent asthma without complication 12/20/4816   Muscle tear 11/15/2012   Left rectus femoris; also like tendon tear    Numbness and tingling of foot 03/29/2012   Patellar tendinitis 03/07/2013   I suspect this occurred because she did less crosstraining before this marathon and was probably hamstring dominant    Right foot pain 04/06/2016   Right knee pain 04/23/2013   04/23/13  ultrasound today RT knee  revealed no significant effusion The quadriceps tendon is intact as is the patellar tendon The medial meniscus was normal The lateral meniscus had an area of calcification and a very small area of splitting along the midline to the posterior third of the joint line    Rotator cuff tendinitis, left 09/08/2016   Overview:  Last Assessment & Plan:  Significant improvement  See OV summary   Sensorineural hearing loss 11/13/2013   Stress fracture of pelvis 08/29/2007   Qualifier: History of  By: Drue Flirt  MD, Taineisha     STRESS FRACTURE, TIBIA 06/15/2009   Qualifier: Diagnosis of  By: Drue Flirt  MD, Taineisha     Thrombocytopenia ([place]) 01/30/2014   Tibialis tendinitis 08/12/2009   Qualifier: Diagnosis of  By: Oneida Alar MD, KARL     Tinnitus 11/13/2013   TROCHANTERIC BURSITIS, LEFT 04/27/2010   Qualifier: Diagnosis of  By: Ernestina Patches MD, Alberteen Spindle LEG LENGTH 09/18/2008   Qualifier: Diagnosis of  By: Oneida Alar MD, KARL     Wrist pain, acute, left 04/23/2019   US shows small fracture off tip of  lister's tubercle Extensor comp 4 strain    Past Surgical History:  Procedure Laterality Date   ABDOMINAL SURGERY     ABDOMINAL SURGERY     for removal of abdominal mass-cancerous   KNEE SURGERY     MANDIBLE FRACTURE SURGERY     NASAL SINUS SURGERY     TONSILLECTOMY      Current Medications: Current Meds  Medication Sig   albuterol (PROVENTIL HFA) 108 (90 Base) MCG/ACT inhaler Inhale 2 puffs into the lungs every 6 (six) hours as needed for wheezing.   clopidogrel (PLAVIX) 75 MG tablet TAKE 1 TABLET (75 MG TOTAL) BY MOUTH DAILY.   diltiazem (CARDIZEM CD) 180 MG 24 hr capsule TAKE 1 CAPSULE (  180 MG TOTAL) BY MOUTH DAILY.   loratadine (CLARITIN) 10 MG tablet Take 1 tablet by mouth daily.   pantoprazole (PROTONIX) 40 MG tablet Take 40 mg by mouth daily.   rosuvastatin (CRESTOR) 10 MG tablet TAKE 1 TABLET BY MOUTH DAILY.     Allergies:   Acetaminophen, Aspirin, Nsaids, and Pravastatin   Social History   Socioeconomic History   Marital status: Legally Separated    Spouse name: Not on file   Number of children: Not on file   Years of education: Not on file   Highest education level: Not on file  Occupational History   Not on file  Tobacco Use   Smoking status: Never    Passive exposure: Never   Smokeless tobacco: Never  Vaping Use   Vaping Use: Never used  Substance and Sexual Activity   Alcohol use: Yes    Comment: occ glass of wine   Drug use: No   Sexual activity: Not on file  Other Topics Concern   Not on file  Social History Narrative   Not on file   Social Determinants of Health   Financial Resource Strain: Not on file  Food Insecurity: Not on file  Transportation Needs: Not on file  Physical Activity: Not on file  Stress: Not on file  Social Connections: Not on file     Family History: The patient's family history includes COPD in her mother; Diabetes in her father and sister; Parkinson's disease in her father; Pulmonary fibrosis in her father; Thyroid  disease in her sister. There is no history of Heart attack. ROS:   Please see the history of present illness.    All other systems reviewed and are negative.  EKGs/Labs/Other Studies Reviewed:    The following studies were reviewed today:  EKG:  EKG ordered today and personally reviewed.  The ekg ordered today demonstrates sinus rhythm normal EKG  Recent Labs: Recently had labs PCP in [facility].  Physical Exam:    VS:  BP (!) 148/80 (BP Location: Right Arm)    Pulse 71    Ht 5' (1.524 m)    Wt 138 lb 3.2 oz (62.7 kg)    SpO2 98%    BMI 26.99 kg/m     Wt Readings from Last 3 Encounters:  07/16/21 138 lb 3.2 oz (62.7 kg)  02/02/21 135 lb 9.6 oz (61.5 kg)  02/01/21 135 lb (61.2 kg)     GEN:  Well nourished, well developed in no acute distress HEENT: Normal NECK: No JVD; No carotid bruits LYMPHATICS: No lymphadenopathy CARDIAC: RRR, no murmurs, rubs, gallops RESPIRATORY:  Clear to auscultation without rales, wheezing or rhonchi  ABDOMEN: Soft, non-tender, non-distended MUSCULOSKELETAL:  No edema; No deformity  SKIN: Warm and dry NEUROLOGIC:  Alert and oriented x 3 PSYCHIATRIC:  Normal affect    Signed, Shirlee More, MD  07/16/2021 12:14 PM    [facility]

## 2021-07-16 ENCOUNTER — Other Ambulatory Visit: Payer: Self-pay

## 2021-07-16 ENCOUNTER — Encounter: Payer: Self-pay | Admitting: Cardiology

## 2021-07-16 ENCOUNTER — Ambulatory Visit: Payer: Medicare PPO | Admitting: Cardiology

## 2021-07-16 VITALS — BP 148/80 | HR 71 | Ht 60.0 in | Wt 138.2 lb

## 2021-07-16 DIAGNOSIS — R0602 Shortness of breath: Secondary | ICD-10-CM

## 2021-07-16 DIAGNOSIS — E782 Mixed hyperlipidemia: Secondary | ICD-10-CM

## 2021-07-16 DIAGNOSIS — I25118 Atherosclerotic heart disease of native coronary artery with other forms of angina pectoris: Secondary | ICD-10-CM

## 2021-07-16 NOTE — Patient Instructions (Signed)
Medication Instructions:  ?Your physician recommends that you continue on your current medications as directed. Please refer to the Current Medication list given to you today.  ? ?*If you need a refill on your cardiac medications before your next appointment, please call your pharmacy* ? ? ?Lab Work: ?None ordered  ? ?If you have labs (blood work) drawn today and your tests are completely normal, you will receive your results only by: ?MyChart Message (if you have MyChart) OR ?A paper copy in the mail ?If you have any lab test that is abnormal or we need to change your treatment, we will call you to review the results. ? ? ?Testing/Procedures: ?Your physician has requested that you have an echocardiogram. Echocardiography is a painless test that uses sound waves to create images of your heart. It provides your doctor with information about the size and shape of your heart and how well your heart?s chambers and valves are working. This procedure takes approximately one hour. There are no restrictions for this procedure. ? ? ? ?Follow-Up: ?At [facility], you and your health needs are our priority.  As part of our continuing mission to provide you with exceptional heart care, we have created designated Provider Care Teams.  These Care Teams include your primary Cardiologist (physician) and Advanced Practice Providers (APPs -  Physician Assistants and Nurse Practitioners) who all work together to provide you with the care you need, when you need it. ? ?We recommend signing up for the patient portal called "MyChart".  Sign up information is provided on this After Visit Summary.  MyChart is used to connect with patients for Virtual Visits (Telemedicine).  Patients are able to view lab/test results, encounter notes, upcoming appointments, etc.  Non-urgent messages can be sent to your provider as well.   ?To learn more about what you can do with MyChart, go to [email or web address].   ? ?Your next appointment:   ?6  month(s) ? ?The format for your next appointment:   ?In Person ? ?Provider:   ?Shirlee More, MD  ? ? ?Other Instructions ?None   ?

## 2021-07-19 ENCOUNTER — Ambulatory Visit: Payer: 59 | Admitting: Cardiology

## 2021-07-19 LAB — COLOGUARD: COLOGUARD: NEGATIVE

## 2021-07-23 ENCOUNTER — Other Ambulatory Visit: Payer: Self-pay

## 2021-07-23 ENCOUNTER — Ambulatory Visit (INDEPENDENT_AMBULATORY_CARE_PROVIDER_SITE_OTHER): Payer: Medicare PPO

## 2021-07-23 DIAGNOSIS — R0602 Shortness of breath: Secondary | ICD-10-CM

## 2021-07-23 LAB — ECHOCARDIOGRAM COMPLETE
Area-P 1/2: 3.03 cm2
S' Lateral: 3.1 cm

## 2021-07-26 ENCOUNTER — Telehealth: Payer: Self-pay | Admitting: *Deleted

## 2021-07-26 NOTE — Telephone Encounter (Signed)
-----   Message from Richardo Priest, MD sent at 07/26/2021  7:45 AM EDT ----- ?This is a good result ? ?I went back and reviewed her cardiac CTA a much better test for looking at the pericardium, it was normal.  Often abnormality seen in echocardiogram is related to gain settings. ? ?Good result heart muscle function is normal  ?

## 2021-07-26 NOTE — Telephone Encounter (Signed)
Informed pt of good result for Echo. ? ?Pt wants to know if she can continue her training for the marathon she wants to do. ?Please advise ?

## 2021-07-27 NOTE — Telephone Encounter (Signed)
Informed pt that Dr. Bettina Gavia said she could continue her training for the marathon. Pt verbalized understanding. ?

## 2021-07-28 DIAGNOSIS — F439 Reaction to severe stress, unspecified: Secondary | ICD-10-CM | POA: Insufficient documentation

## 2021-08-05 ENCOUNTER — Other Ambulatory Visit (HOSPITAL_COMMUNITY): Payer: Self-pay

## 2021-08-06 ENCOUNTER — Encounter: Payer: Self-pay | Admitting: Emergency Medicine

## 2021-08-06 ENCOUNTER — Ambulatory Visit (INDEPENDENT_AMBULATORY_CARE_PROVIDER_SITE_OTHER): Payer: Medicare PPO

## 2021-08-06 ENCOUNTER — Ambulatory Visit: Payer: Medicare PPO | Admitting: Emergency Medicine

## 2021-08-06 DIAGNOSIS — J4520 Mild intermittent asthma, uncomplicated: Secondary | ICD-10-CM | POA: Diagnosis not present

## 2021-08-06 DIAGNOSIS — K219 Gastro-esophageal reflux disease without esophagitis: Secondary | ICD-10-CM

## 2021-08-06 DIAGNOSIS — J301 Allergic rhinitis due to pollen: Secondary | ICD-10-CM

## 2021-08-06 NOTE — Addendum Note (Signed)
Addended by: Gavin Potters R on: 08/06/2021 02:50 PM ? ? Modules accepted: Orders ? ?

## 2021-08-06 NOTE — Assessment & Plan Note (Signed)
Continue pantoprazole 40 mg once daily.  Take this medication 1 hour around food. ?

## 2021-08-06 NOTE — Assessment & Plan Note (Signed)
Continue loratadine 10 mg once daily. ?

## 2021-08-06 NOTE — Addendum Note (Signed)
Addended by: Gavin Potters R on: 08/06/2021 02:47 PM ? ? Modules accepted: Orders ? ?

## 2021-08-06 NOTE — Assessment & Plan Note (Signed)
Mild intermittent asthma.  She had significant flaring beginning in January and then subsequently had COVID.  She has not gotten back to her usual functional capacity which is quite good-usually runs, exercises frequently.  She needs pulmonary function testing and a chest x-ray to evaluate for any impact of COVID.  For now we will continue albuterol as needed, hold off on scheduled bronchodilator therapy. ?

## 2021-08-06 NOTE — Progress Notes (Signed)
? ?Subjective:  ? ? Patient ID: Rhonda [name], female    DOB: [birth year], 71 y.o.   MRN: [id number] ? ?Asthma ?There is no cough, shortness of breath or wheezing. Pertinent negatives include no ear pain, fever, headaches, postnasal drip, rhinorrhea, sneezing, sore throat or trouble swallowing. Her past medical history is significant for asthma.   ? ?ROV 02/02/2021 --71 year old woman with a history of mild intermittent asthma (positive bronchodilator response on spirometry), chronic upper airway irritation syndrome with associated cough in the setting of rhinitis, GERD. ?She is on Protonix, loratadine.  No maintenance bronchodilator regimen.  Has albuterol and reports that she uses ?She is doing quite well. She has only needed her albuterol 3x since I last saw her. She is still quite active, exercises every day. She is planning to run a marathon. She usually pre-treats her runs with albuterol especially in the Summer. No real cough, GERD controlled on PPI, she is interested in trying to come off of it ? ? ?ROV 08/06/2021 --follow-up visit for 71 year old woman with a history of mild intermittent asthma, chronic upper airway irritation syndrome and associated cough.  She has allergic rhinitis and GERD that contribute to both of these problems.  At her last visit in September we made a plan to try to come off PPI to see if she would tolerate - she is now back on it. ?She noted some increased allergic symptoms in November, tried changing her loratadine to an alternative > changed to Allegra, but then back to loratadine.  ?She reports that she has had flaring sx all through January, was treated with few courses of pred and abx. Then she had COVID in late January. Was treated w paxlovid. She is improving, still has some low energy, exertional SOB especially w stairs. She can walk at normal pace on flat ground. She is trying to get back to running, pretreats with albuterol, is not back to baseline pre-COVID.  ? ? ?Review  of Systems  ?Constitutional:  Negative for fever and unexpected weight change.  ?HENT:  Negative for congestion, dental problem, ear pain, nosebleeds, postnasal drip, rhinorrhea, sinus pressure, sneezing, sore throat and trouble swallowing.   ?Eyes:  Negative for redness and itching.  ?Respiratory:  Negative for cough, chest tightness, shortness of breath and wheezing.   ?Cardiovascular:  Negative for palpitations and leg swelling.  ?Gastrointestinal:  Negative for nausea and vomiting.  ?Genitourinary:  Negative for dysuria.  ?Musculoskeletal:  Negative for joint swelling.  ?Skin:  Negative for rash.  ?Neurological:  Negative for headaches.  ?Hematological:  Does not bruise/bleed easily.  ?Psychiatric/Behavioral:  Negative for dysphoric mood. The patient is not nervous/anxious.   ? ?   ?Objective:  ? Physical Exam ?Vitals:  ? 08/06/21 1422  ?BP: 120/68  ?Pulse: 68  ?Temp: 98.4 ?F (36.9 ?C)  ?TempSrc: Oral  ?SpO2: 97%  ?Weight: 142 lb (64.4 kg)  ?Height: 5' (1.524 m)  ? ? ? ?Gen: Pleasant, well-nourished, in no distress,  normal affect ? ?ENT: No lesions,  mouth clear,  oropharynx clear, no postnasal drip ? ?Neck: No JVD, no stridor ? ?Lungs: No use of accessory muscles, no dullness to percussion, clear without rales or rhonchi ? ?Cardiovascular: RRR, heart sounds normal, no murmur or gallops, no peripheral edema ? ?Musculoskeletal: No deformities, no cyanosis or clubbing ? ?Neuro: alert, non focal ? ?Skin: Warm, no lesions or rash ? ? ?   ?Assessment & Plan:  ?Mild intermittent asthma without complication ?Mild intermittent asthma.  She  had significant flaring beginning in January and then subsequently had COVID.  She has not gotten back to her usual functional capacity which is quite good-usually runs, exercises frequently.  She needs pulmonary function testing and a chest x-ray to evaluate for any impact of COVID.  For now we will continue albuterol as needed, hold off on scheduled bronchodilator  therapy. ? ?Allergic rhinitis ?Continue loratadine 10 mg once daily. ? ?Gastroesophageal reflux disease without esophagitis ?Continue pantoprazole 40 mg once daily.  Take this medication 1 hour around food. ? ?Baltazar Apo, MD, PhD ?08/06/2021, 2:42 PM ?[facility] ?[phone] or if no answer [phone] ? ?

## 2021-08-06 NOTE — Patient Instructions (Signed)
We will perform a chest x-ray today ?We will arrange for pulmonary function testing to compare with your priors. ?Keep your albuterol available to use 2 puffs when you needed for shortness of breath, chest tightness, wheezing. ?Continue loratadine 10 mg once daily. ?Continue pantoprazole 40 mg once daily.  Take this medication 1 hour around food. ?Continue to work on slowly and steadily increasing your activity and conditioning. ?Follow with Dr. Lamonte Sakai next available with full pulmonary function testing on the same day.  ? ?

## 2021-08-17 ENCOUNTER — Other Ambulatory Visit (HOSPITAL_COMMUNITY): Payer: Self-pay

## 2021-09-27 ENCOUNTER — Ambulatory Visit: Payer: Medicare PPO | Admitting: Family Medicine

## 2021-09-27 VITALS — BP 126/80 | Ht 60.0 in | Wt 140.0 lb

## 2021-09-27 DIAGNOSIS — M25561 Pain in right knee: Secondary | ICD-10-CM

## 2021-09-27 DIAGNOSIS — M25571 Pain in right ankle and joints of right foot: Secondary | ICD-10-CM

## 2021-09-27 NOTE — Patient Instructions (Signed)
It was good to see you!  I think that course was rough on your hamstring and lateral ankle. 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 Your distal hamstring tendon is inflamed. Icing, tylenol, topical medications. Start rehab exercises and do these for the next 6 weeks. I would wait to start your training program until next week and start on a flat surface or treadmill. Call or message me with any issues otherwise follow up as needed.

## 2021-09-27 NOTE — Progress Notes (Unsigned)
PCP: Loraine Leriche., MD  Subjective:   HPI: Patient is a 71 y.o. female here for right knee and right ankle pain.  Patient ran a half marathon 9 days ago. She reports it was a dangerous course, the first 8 miles of which were down a mountain (with most portions >10% grade). At mile 10.5, patient developed right ankle pain to the point where she needed to walk the last 2.5 miles. At the finish line she was unable to walk secondary to her right ankle pain. Pain was located along posterolateral aspect of the ankle and was associated with significant swelling. She wore compression socks for 4 days straight which helped. The ankle is much improved but she still has some residual swelling and pain with walking.  Patient also reports pain at the posterolateral aspect of her right knee. She started noticing this pain a few days after the race (once her ankle began improving). It was sore to the touch and very painful with walking. Occasionally felt like it may give out.  Patient notes she wasn't as prepared for this half marathon as prior races due to several life circumstances including multiple serious illnesses this year. States she wore running shoes with over 600 miles on them, whereas she usually changes shoes every 300 miles.  Past Medical History:  Diagnosis Date   Achilles tendinitis of right lower extremity 06/19/2014   US showed minor changes in December 15  Most sxs seem to be enthesopathy  Note had reaction to NTG in past  Overview:  Overview:  US showed minor changes in December 15  Most sxs seem to be enthesopathy  Note had reaction to NTG in past  Last Assessment & Plan:  Recent irritation in the race appears to be a partial plantaris tear  This continues to give her some Achilles symptoms  I think a period   ALLERGIC RHINITIS 10/12/2006   Qualifier: Diagnosis of  By: McDiarmid MD, Todd     Allergic rhinitis 10/12/2006   Overview:  Overview:  Qualifier: Diagnosis of  By: McDiarmid MD,  Cyndie Chime of this note might be different from the original. Overview:  Qualifier: Diagnosis of  By: McDiarmid MD, Todd   Ankle pain 08/17/2011   Asthma 10/12/2006   Qualifier: Diagnosis of  By: McDiarmid MD, Sherren Mocha    Overview:  Overview:  Qualifier: Diagnosis of  By: McDiarmid MD, Todd    Benign paroxysmal positional vertigo 11/13/2013   Chest pain in adult 08/17/2011   This has worrisome features particularly with her level of exertion    Exercise-induced asthma 05/24/2018   First degree AV block 03/21/2018   Gastroesophageal reflux disease without esophagitis 07/23/2018   HAMMER TOE, OTHER, ACQUIRED 10/13/2006   Qualifier: Diagnosis of  By: McDiarmid MD, Sherren Mocha    Overview:  Overview:  Qualifier: Diagnosis of  By: McDiarmid MD, Todd    High cholesterol    Hyperlipidemia    Hyperlipidemia LDL goal <130 10/24/2013   Injury of plantaris muscle or tendon 10/30/2014   Insomnia 11/13/2013   Knee pain, left 06/15/2017   Left hip pain 11/04/2019   LEG PAIN, RIGHT 09/22/2009   Qualifier: Diagnosis of  By: Oneida Alar MD, KARL     Lichen sclerosus of female genitalia 07/24/2019   Metatarsal stress fracture 08/29/2012   This is at the distal shaft of the fifth metatarsal left foot    Mild intermittent asthma without complication 6/76/1950   Muscle tear 11/15/2012   Left rectus  femoris; also like tendon tear    Numbness and tingling of foot 03/29/2012   Patellar tendinitis 03/07/2013   I suspect this occurred because she did less crosstraining before this marathon and was probably hamstring dominant    Right foot pain 04/06/2016   Right knee pain 04/23/2013   04/23/13  ultrasound today RT knee  revealed no significant effusion The quadriceps tendon is intact as is the patellar tendon The medial meniscus was normal The lateral meniscus had an area of calcification and a very small area of splitting along the midline to the posterior third of the joint line    Rotator cuff tendinitis, left 09/08/2016   Overview:   Last Assessment & Plan:  Significant improvement  See OV summary   Sensorineural hearing loss 11/13/2013   Stress fracture of pelvis 08/29/2007   Qualifier: History of  By: Drue Flirt  MD, Taineisha     STRESS FRACTURE, TIBIA 06/15/2009   Qualifier: Diagnosis of  By: Drue Flirt  MD, Taineisha     Thrombocytopenia ([place]) 01/30/2014   Tibialis tendinitis 08/12/2009   Qualifier: Diagnosis of  By: Oneida Alar MD, KARL     Tinnitus 11/13/2013   TROCHANTERIC BURSITIS, LEFT 04/27/2010   Qualifier: Diagnosis of  By: Ernestina Patches MD, Alberteen Spindle LEG LENGTH 09/18/2008   Qualifier: Diagnosis of  By: Oneida Alar MD, KARL     Wrist pain, acute, left 04/23/2019   US shows small fracture off tip of lister's tubercle Extensor comp 4 strain    Current Outpatient Medications on File Prior to Visit  Medication Sig Dispense Refill   albuterol (PROVENTIL HFA) 108 (90 Base) MCG/ACT inhaler Inhale 2 puffs into the lungs every 6 (six) hours as needed for wheezing. 8.5 g 2   clopidogrel (PLAVIX) 75 MG tablet TAKE 1 TABLET (75 MG TOTAL) BY MOUTH DAILY. 90 tablet 3   diltiazem (CARDIZEM CD) 180 MG 24 hr capsule TAKE 1 CAPSULE (180 MG TOTAL) BY MOUTH DAILY. 90 capsule 3   loratadine (CLARITIN) 10 MG tablet Take 1 tablet by mouth daily.     pantoprazole (PROTONIX) 40 MG tablet Take 40 mg by mouth daily.     rosuvastatin (CRESTOR) 10 MG tablet TAKE 1 TABLET BY MOUTH DAILY. 90 tablet 2   [DISCONTINUED] metoprolol tartrate (LOPRESSOR) 100 MG tablet Take 1 tablet (100 mg total) by mouth once for 1 dose. Take two hours prior to your cardiac CT 1 tablet 0   [DISCONTINUED] omeprazole (PRILOSEC) 20 MG capsule Take 1 capsule (20 mg total) by mouth 2 (two) times daily. 180 capsule 3   No current facility-administered medications on file prior to visit.    Past Surgical History:  Procedure Laterality Date   ABDOMINAL SURGERY     ABDOMINAL SURGERY     for removal of abdominal mass-cancerous   KNEE SURGERY     MANDIBLE FRACTURE SURGERY     NASAL  SINUS SURGERY     TONSILLECTOMY      Allergies  Allergen Reactions   Acetaminophen     REACTION: mild throat swelling if takes more than a few consecutive doses   Aspirin     REACTION: anaphylaxis   Nsaids     REACTION: face' \\T'$ \throat swelling, sneezing and difficulty breathing   Pravastatin Other (See Comments)    "joint pains"    BP 126/80   Ht 5' (1.524 m)   Wt 140 lb (63.5 kg)   BMI 27.34 kg/m       Objective:  Physical Exam:  Gen: NAD, comfortable in exam room  Right Knee: inspection is normal without deformity or skin changes. No effusion.  There is tenderness to palpation at the insertion of the biceps femoris. No TTP along joint line. FROM. 5/5 strength with flexion and extension.  Negative anterior drawer, posterior drawer, and McMurray's.  Right Ankle: mild swelling at posterior aspect of lateral malleolus. No tenderness to palpation. FROM. Normal strength with dorsiflexion, plantarflexion, and both inversion and eversion.  Mildly antalgic gait   Assessment & Plan:  1. Right Knee Pain- secondary to biceps femoris tendinitis s/p downhill half marathon. Anticipate improvement with ice, rest, and topical medications prn. Patient also given rehab exercises to perform at home. Recommend gradual return to running on flat surface after 1 week of rest.  2. Right Ankle Pain- suspicious for sinus tarsi syndrome. Symptoms already improving significantly and exam is fairly unremarkable other than mild swelling today. Continue ice, rest over the next 1 week. Then gradual return to running as tolerated.   Alcus Dad, MD PGY-2 [place]

## 2021-09-28 ENCOUNTER — Encounter: Payer: Self-pay | Admitting: Family Medicine

## 2021-10-12 ENCOUNTER — Encounter: Payer: Self-pay | Admitting: Emergency Medicine

## 2021-10-12 ENCOUNTER — Ambulatory Visit: Payer: Medicare PPO | Admitting: Emergency Medicine

## 2021-10-12 ENCOUNTER — Ambulatory Visit (INDEPENDENT_AMBULATORY_CARE_PROVIDER_SITE_OTHER): Payer: Medicare PPO | Admitting: Emergency Medicine

## 2021-10-12 DIAGNOSIS — J4520 Mild intermittent asthma, uncomplicated: Secondary | ICD-10-CM | POA: Diagnosis not present

## 2021-10-12 DIAGNOSIS — J301 Allergic rhinitis due to pollen: Secondary | ICD-10-CM

## 2021-10-12 DIAGNOSIS — K219 Gastro-esophageal reflux disease without esophagitis: Secondary | ICD-10-CM | POA: Diagnosis not present

## 2021-10-12 LAB — PULMONARY FUNCTION TEST
DL/VA % pred: 111 %
DL/VA: 4.77 ml/min/mmHg/L
DLCO cor % pred: 116 %
DLCO cor: 19.72 ml/min/mmHg
DLCO unc % pred: 116 %
DLCO unc: 19.72 ml/min/mmHg
FEF 25-75 Post: 2.41 L/sec
FEF 25-75 Pre: 1.47 L/sec
FEF2575-%Change-Post: 63 %
FEF2575-%Pred-Post: 147 %
FEF2575-%Pred-Pre: 90 %
FEV1-%Change-Post: 15 %
FEV1-%Pred-Post: 104 %
FEV1-%Pred-Pre: 90 %
FEV1-Post: 1.95 L
FEV1-Pre: 1.68 L
FEV1FVC-%Change-Post: 5 %
FEV1FVC-%Pred-Pre: 103 %
FEV6-%Change-Post: 9 %
FEV6-%Pred-Post: 99 %
FEV6-%Pred-Pre: 91 %
FEV6-Post: 2.36 L
FEV6-Pre: 2.16 L
FEV6FVC-%Pred-Post: 105 %
FEV6FVC-%Pred-Pre: 105 %
FVC-%Change-Post: 9 %
FVC-%Pred-Post: 95 %
FVC-%Pred-Pre: 86 %
FVC-Post: 2.36 L
FVC-Pre: 2.16 L
Post FEV1/FVC ratio: 83 %
Post FEV6/FVC ratio: 100 %
Pre FEV1/FVC ratio: 78 %
Pre FEV6/FVC Ratio: 100 %
RV % pred: 118 %
RV: 2.39 L
TLC % pred: 106 %
TLC: 4.74 L

## 2021-10-12 NOTE — Assessment & Plan Note (Addendum)
Reviewed her pulmonary function testing today.  She has normal baseline airflows but mild obstruction based on her positive bronchodilator response of 15%.  Her FEV1 is 90% predicted, down from 95% predicted in 2019.  This could be due to aging, COVID-19.  Overall her functional capacity is good and she continues to work on getting in better shape.  She wants to run a marathon in November.  She understands using albuterol as needed and pretreating her runs and exercise. If at any point she experiences a decrease in functioning then we could repeat her PFT and perform a CT chest to look for any evidence of scarring or interval change post COVID-19 in February 2023.

## 2021-10-12 NOTE — Patient Instructions (Signed)
Full PFT Performed Today  

## 2021-10-12 NOTE — Patient Instructions (Addendum)
We reviewed your pulmonary function testing today. We reviewed your chest x-ray today Please keep your albuterol available to use 2 puffs if needed for shortness of breath.  You should also pretreat exertion with 2 puffs.   Continue to exercise regularly and build up your stamina and functional capacity. Continue loratadine and pantoprazole as you have been taking them. Follow Dr. Lamonte Sakai in 6 months or sooner if you have any problems.

## 2021-10-12 NOTE — Assessment & Plan Note (Signed)
Continue pantoprazole. °

## 2021-10-12 NOTE — Progress Notes (Signed)
Subjective:    Patient ID: Rhonda [name], female    DOB: 08-28-1950, 71 y.o.   MRN: [id number]  Asthma There is no cough, shortness of breath or wheezing. Pertinent negatives include no ear pain, fever, headaches, postnasal drip, rhinorrhea, sneezing, sore throat or trouble swallowing. Her past medical history is significant for asthma.    ROV 08/06/2021 --follow-up visit for 71 year old woman with a history of mild intermittent asthma, chronic upper airway irritation syndrome and associated cough.  She has allergic rhinitis and GERD that contribute to both of these problems.  At her last visit in September we made a plan to try to come off PPI to see if she would tolerate - she is now back on it. She noted some increased allergic symptoms in November, tried changing her loratadine to an alternative > changed to Allegra, but then back to loratadine.  She reports that she has had flaring sx all through January, was treated with few courses of pred and abx. Then she had COVID in late January. Was treated w paxlovid. She is improving, still has some low energy, exertional SOB especially w stairs. She can walk at normal pace on flat ground. She is trying to get back to running, pretreats with albuterol, is not back to baseline pre-COVID.    ROV 10/12/21 --71 year old woman with history of mild intermittent asthma, chronic cough in the setting of this and also chronic upper airway irritation syndrome, allergic rhinitis, GERD.  She had COVID-19 in 06/2021.  She never really got back to her usual functional capacity, ability to run, exercise.  Chest x-ray 08/06/2021 showed some mild bibasilar subsegmental atelectasis right greater than left.  She underwent pulmonary function testing today as below. She did a half-marathon since I last saw her. She has not needed any albuterol and in fact has not been pre-treating every run.   Pulmonary function testing performed today and reviewed by me shows grossly normal  airflows but clear evidence for mild obstruction based on a positive bronchodilator response of 15% in the FEV1.  Her lung volumes are normal and her diffusion capacity is normal.   Review of Systems  Constitutional:  Negative for fever and unexpected weight change.  HENT:  Negative for congestion, dental problem, ear pain, nosebleeds, postnasal drip, rhinorrhea, sinus pressure, sneezing, sore throat and trouble swallowing.   Eyes:  Negative for redness and itching.  Respiratory:  Negative for cough, chest tightness, shortness of breath and wheezing.   Cardiovascular:  Negative for palpitations and leg swelling.  Gastrointestinal:  Negative for nausea and vomiting.  Genitourinary:  Negative for dysuria.  Musculoskeletal:  Negative for joint swelling.  Skin:  Negative for rash.  Neurological:  Negative for headaches.  Hematological:  Does not bruise/bleed easily.  Psychiatric/Behavioral:  Negative for dysphoric mood. The patient is not nervous/anxious.       Objective:   Physical Exam Vitals:   10/12/21 1015  BP: 132/74  Pulse: 62  Temp: 98.5 F (36.9 C)  TempSrc: Oral  SpO2: 100%  Weight: 142 lb (64.4 kg)  Height: 5' (1.524 m)     Gen: Pleasant, well-nourished, in no distress,  normal affect  ENT: No lesions,  mouth clear,  oropharynx clear, no postnasal drip  Neck: No JVD, no stridor  Lungs: No use of accessory muscles, no dullness to percussion, clear without rales or rhonchi  Cardiovascular: RRR, heart sounds normal, no murmur or gallops, no peripheral edema  Musculoskeletal: No deformities, no cyanosis or clubbing  Neuro: alert, non focal  Skin: Warm, no lesions or rash      Assessment & Plan:  Mild intermittent asthma without complication Reviewed her pulmonary function testing today.  She has normal baseline airflows but mild obstruction based on her positive bronchodilator response of 15%.  Her FEV1 is 90% predicted, down from 95% predicted in 2019.  This  could be due to aging, COVID-19.  Overall her functional capacity is good and she continues to work on getting in better shape.  She wants to run a marathon in November.  She understands using albuterol as needed and pretreating her runs and exercise. If at any point she experiences a decrease in functioning then we could repeat her PFT and perform a CT chest to look for any evidence of scarring or interval change post COVID-19 in February 2023.  Allergic rhinitis Continue loratadine  Gastroesophageal reflux disease without esophagitis Continue pantoprazole  Baltazar Apo, MD, PhD 10/12/2021, 10:44 AM [facility] [phone] or if no answer [phone]

## 2021-10-12 NOTE — Assessment & Plan Note (Signed)
Continue loratadine 

## 2021-10-12 NOTE — Progress Notes (Signed)
Full PFT Performed Today  

## 2021-10-28 DIAGNOSIS — E663 Overweight: Secondary | ICD-10-CM | POA: Insufficient documentation

## 2021-10-28 DIAGNOSIS — M62838 Other muscle spasm: Secondary | ICD-10-CM | POA: Insufficient documentation

## 2021-12-24 ENCOUNTER — Other Ambulatory Visit: Payer: Self-pay | Admitting: Cardiology

## 2021-12-24 ENCOUNTER — Encounter: Payer: Self-pay | Admitting: Emergency Medicine

## 2022-01-26 ENCOUNTER — Encounter: Payer: Self-pay | Admitting: Emergency Medicine

## 2022-01-26 ENCOUNTER — Ambulatory Visit: Payer: Medicare PPO | Admitting: Emergency Medicine

## 2022-01-26 VITALS — BP 118/72 | HR 69 | Temp 98.6°F | Ht 60.0 in | Wt 143.6 lb

## 2022-01-26 DIAGNOSIS — K219 Gastro-esophageal reflux disease without esophagitis: Secondary | ICD-10-CM | POA: Diagnosis not present

## 2022-01-26 DIAGNOSIS — J301 Allergic rhinitis due to pollen: Secondary | ICD-10-CM | POA: Diagnosis not present

## 2022-01-26 DIAGNOSIS — R0602 Shortness of breath: Secondary | ICD-10-CM | POA: Diagnosis not present

## 2022-01-26 DIAGNOSIS — R0600 Dyspnea, unspecified: Secondary | ICD-10-CM | POA: Insufficient documentation

## 2022-01-26 DIAGNOSIS — J4520 Mild intermittent asthma, uncomplicated: Secondary | ICD-10-CM

## 2022-01-26 DIAGNOSIS — Z23 Encounter for immunization: Secondary | ICD-10-CM

## 2022-01-26 NOTE — Patient Instructions (Signed)
Keep your albuterol available to use 2 puffs if needed for shortness of breath, chest tightness, wheezing.  Continue to pretreat your exercise with 2 puffs. Continue loratadine 10 mg once daily Continue your pantoprazole as you have been taking it Flu shot today Get the COVID-19 booster when it is available this fall We discussed possibly getting the RSV vaccine.  Recommend that the flu shot and COVID-19 booster are more important to get Agree with restarting your exercise routine.  Keep track of how your breathing does.  We will discuss next time and decide whether any other work-up for post COVID scarring or inflammatory change is indicated. Follow Dr. Lamonte Sakai in December, sooner if you have any problems

## 2022-01-26 NOTE — Assessment & Plan Note (Signed)
Continue loratadine.  Good control with this

## 2022-01-26 NOTE — Assessment & Plan Note (Signed)
She had been running until she had to take a break when she had cataract surgery.  She is going to get back to training now.  Depending on how her breathing does, how her functional capacity does we will decide whether there may be some benefit to a dedicated high-resolution CT scan of the chest to look for any sequela from her COVID-19 from 06/2021.

## 2022-01-26 NOTE — Progress Notes (Signed)
Subjective:    Patient ID: Rhonda [name], female    DOB: April 19, 1951, 71 y.o.   MRN: [id number]  Asthma There is no cough, shortness of breath or wheezing. Pertinent negatives include no ear pain, fever, headaches, postnasal drip, rhinorrhea, sneezing, sore throat or trouble swallowing. Her past medical history is significant for asthma.     ROV 10/12/21 --71 year old woman with history of mild intermittent asthma, chronic cough in the setting of this and also chronic upper airway irritation syndrome, allergic rhinitis, GERD.  She had COVID-19 in 06/2021.  She never really got back to her usual functional capacity, ability to run, exercise.  Chest x-ray 08/06/2021 showed some mild bibasilar subsegmental atelectasis right greater than left.  She underwent pulmonary function testing today as below. She did a half-marathon since I last saw her. She has not needed any albuterol and in fact has not been pre-treating every run.   Pulmonary function testing performed today and reviewed by me shows grossly normal airflows but clear evidence for mild obstruction based on a positive bronchodilator response of 15% in the FEV1.  Her lung volumes are normal and her diffusion capacity is normal.  ROV 01/26/22 --Rhonda [name] is 59 and follows up today for her history of mild intermittent asthma, upper airway irritation with chronic cough in the setting of GERD and rhinitis.  She had COVID-19 in February 23, took a while for her to recoup and regain her functional capacity.  She is training for a marathon in April - has not run for 10 weeks as she has been going thru cataract surgery.  Today she reports that she has done quite well, but she notices some increased wob when humidity is high.  Remains on loratadine, pantoprazole.  She has albuterol, uses rarely. She does pre-treat her runs.  She is interested in flu shot, covid booster, wonders about rsv.    Review of Systems  Constitutional:  Negative for fever and  unexpected weight change.  HENT:  Negative for congestion, dental problem, ear pain, nosebleeds, postnasal drip, rhinorrhea, sinus pressure, sneezing, sore throat and trouble swallowing.   Eyes:  Negative for redness and itching.  Respiratory:  Negative for cough, chest tightness, shortness of breath and wheezing.   Cardiovascular:  Negative for palpitations and leg swelling.  Gastrointestinal:  Negative for nausea and vomiting.  Genitourinary:  Negative for dysuria.  Musculoskeletal:  Negative for joint swelling.  Skin:  Negative for rash.  Neurological:  Negative for headaches.  Hematological:  Does not bruise/bleed easily.  Psychiatric/Behavioral:  Negative for dysphoric mood. The patient is not nervous/anxious.        Objective:   Physical Exam Vitals:   01/26/22 1130  BP: 118/72  [name]: 69  Temp: 98.6 F (37 C)  TempSrc: Oral  SpO2: 99%  Weight: 143 lb 9.6 oz (65.1 kg)  Height: 5' (1.524 m)     Gen: Pleasant, well-nourished, in no distress,  normal affect  ENT: No lesions,  mouth clear,  oropharynx clear, no postnasal drip  Neck: No JVD, no stridor  Lungs: No use of accessory muscles, no dullness to percussion, clear without rales or rhonchi  Cardiovascular: RRR, heart sounds normal, no murmur or gallops, no peripheral edema  Musculoskeletal: No deformities, no cyanosis or clubbing  Neuro: alert, non focal  Skin: Warm, no lesions or rash      Assessment & Plan:  Mild intermittent asthma without complication Keep your albuterol available to use 2 puffs if needed for shortness  of breath, chest tightness, wheezing.  Continue to pretreat your exercise with 2 puffs. Flu shot today Get the COVID-19 booster when it is available this fall We discussed possibly getting the RSV vaccine.  Recommend that the flu shot and COVID-19 booster are more important to get Follow Dr. Lamonte Sakai in December, sooner if you have any problems  Allergic rhinitis Continue loratadine.   Good control with this  Gastroesophageal reflux disease without esophagitis Continue pantoprazole.  Benefiting  Dyspnea She had been running until she had to take a break when she had cataract surgery.  She is going to get back to training now.  Depending on how her breathing does, how her functional capacity does we will decide whether there may be some benefit to a dedicated high-resolution CT scan of the chest to look for any sequela from her COVID-19 from 06/2021.  Baltazar Apo, MD, PhD 01/26/2022, 11:52 AM [facility] [phone] or if no answer [phone]

## 2022-01-26 NOTE — Assessment & Plan Note (Signed)
Keep your albuterol available to use 2 puffs if needed for shortness of breath, chest tightness, wheezing.  Continue to pretreat your exercise with 2 puffs. Flu shot today Get the COVID-19 booster when it is available this fall We discussed possibly getting the RSV vaccine.  Recommend that the flu shot and COVID-19 booster are more important to get Follow Dr. Lamonte Sakai in December, sooner if you have any problems

## 2022-01-26 NOTE — Assessment & Plan Note (Signed)
Continue pantoprazole.  Benefiting

## 2022-01-31 ENCOUNTER — Ambulatory Visit: Payer: Medicare PPO | Attending: Cardiology | Admitting: Cardiology

## 2022-01-31 ENCOUNTER — Encounter: Payer: Self-pay | Admitting: Cardiology

## 2022-01-31 VITALS — BP 136/78 | HR 77 | Ht 60.0 in | Wt 144.2 lb

## 2022-01-31 DIAGNOSIS — E782 Mixed hyperlipidemia: Secondary | ICD-10-CM

## 2022-01-31 DIAGNOSIS — I7090 Unspecified atherosclerosis: Secondary | ICD-10-CM | POA: Insufficient documentation

## 2022-01-31 DIAGNOSIS — R0602 Shortness of breath: Secondary | ICD-10-CM | POA: Diagnosis not present

## 2022-01-31 DIAGNOSIS — I25118 Atherosclerotic heart disease of native coronary artery with other forms of angina pectoris: Secondary | ICD-10-CM | POA: Diagnosis not present

## 2022-01-31 MED ORDER — NITROGLYCERIN 0.4 MG SL SUBL: 0.4000 mg | SUBLINGUAL_TABLET | SUBLINGUAL | 1 refills | Status: DC | PRN

## 2022-01-31 NOTE — Progress Notes (Signed)
Cardiology Office Note:    Date:  01/31/2022   ID:  Rhonda [name], DOB 04/12/1951, MRN [id number]  PCP:  Loraine Leriche., MD  Cardiologist:  Shirlee More, MD    Referring MD: Loraine Leriche.,*    ASSESSMENT:    1. Coronary artery disease of native artery of native heart with stable angina pectoris ([place])   2. Mixed hyperlipidemia   3. SOB (shortness of breath)    PLAN:    In order of problems listed above:  She continues to do well not having angina we will continue her medical therapy including clopidogrel rate limiting calcium channel blocker avoiding beta-blockers with her asthma and effective lipid-lowering therapy with LDL at target.  I do not think she requires a repeat ischemia evaluation she is reassured and asked to be seen by me again in 6 months Follow through with pulmonary and tells me she is to have a high-resolution CT of the chest.  She is starting her exercise program again training to try to do a half marathon   Next appointment: 6 months   Medication Adjustments/Labs and Tests Ordered: Current medicines are reviewed at length with the patient today.  Concerns regarding medicines are outlined above.  No orders of the defined types were placed in this encounter.  Meds ordered this encounter  Medications   nitroGLYCERIN (NITROSTAT) 0.4 MG SL tablet    Sig: Place 1 tablet (0.4 mg total) under the tongue every 5 (five) minutes as needed for chest pain.    Dispense:  25 tablet    Refill:  1    Chief Complaint  Patient presents with   Follow-up   Coronary Artery Disease    History of Present Illness:    Rhonda [name] is a 71 y.o. female with a hx of CAD hyperlipidemia asthma GERD and moderate stenosis of the LAD on coronary CTA 04/22/2020.  She was last seen 07/16/2021.  Compliance with diet, lifestyle and medications: Yes  She has struggled with COVID-19 infection and is following with pulmonary. She is not having chest pain edema  palpitation or syncope but does have residual shortness of breath.  She is pending CT of chest. Tolerates lipid-lowering without muscle pain or weakness LDL is ideal. Past Medical History:  Diagnosis Date   Achilles tendinitis of right lower extremity 06/19/2014   US showed minor changes in December 15  Most sxs seem to be enthesopathy  Note had reaction to NTG in past  Overview:  Overview:  US showed minor changes in December 15  Most sxs seem to be enthesopathy  Note had reaction to NTG in past  Last Assessment & Plan:  Recent irritation in the race appears to be a partial plantaris tear  This continues to give her some Achilles symptoms  I think a period   ALLERGIC RHINITIS 10/12/2006   Qualifier: Diagnosis of  By: McDiarmid MD, Todd     Allergic rhinitis 10/12/2006   Overview:  Overview:  Qualifier: Diagnosis of  By: McDiarmid MD, Cyndie Chime of this note might be different from the original. Overview:  Qualifier: Diagnosis of  By: McDiarmid MD, Todd   Ankle pain 08/17/2011   Asthma 10/12/2006   Qualifier: Diagnosis of  By: McDiarmid MD, Sherren Mocha    Overview:  Overview:  Qualifier: Diagnosis of  By: McDiarmid MD, Todd    Benign paroxysmal positional vertigo 11/13/2013   Chest pain in adult 08/17/2011   This has worrisome features particularly with  her level of exertion    Exercise-induced asthma 05/24/2018   First degree AV block 03/21/2018   Gastroesophageal reflux disease without esophagitis 07/23/2018   HAMMER TOE, OTHER, ACQUIRED 10/13/2006   Qualifier: Diagnosis of  By: McDiarmid MD, Sherren Mocha    Overview:  Overview:  Qualifier: Diagnosis of  By: McDiarmid MD, Todd    High cholesterol    Hyperlipidemia    Hyperlipidemia LDL goal <130 10/24/2013   Injury of plantaris muscle or tendon 10/30/2014   Insomnia 11/13/2013   Knee pain, left 06/15/2017   Left hip pain 11/04/2019   LEG PAIN, RIGHT 09/22/2009   Qualifier: Diagnosis of  By: Oneida Alar MD, KARL     Lichen sclerosus of female genitalia 07/24/2019    Metatarsal stress fracture 08/29/2012   This is at the distal shaft of the fifth metatarsal left foot    Mild intermittent asthma without complication 3/71/6967   Muscle tear 11/15/2012   Left rectus femoris; also like tendon tear    Numbness and tingling of foot 03/29/2012   Patellar tendinitis 03/07/2013   I suspect this occurred because she did less crosstraining before this marathon and was probably hamstring dominant    Right foot pain 04/06/2016   Right knee pain 04/23/2013   04/23/13  ultrasound today RT knee  revealed no significant effusion The quadriceps tendon is intact as is the patellar tendon The medial meniscus was normal The lateral meniscus had an area of calcification and a very small area of splitting along the midline to the posterior third of the joint line    Rotator cuff tendinitis, left 09/08/2016   Overview:  Last Assessment & Plan:  Significant improvement  See OV summary   Sensorineural hearing loss 11/13/2013   Stress fracture of pelvis 08/29/2007   Qualifier: History of  By: Drue Flirt  MD, [place], TIBIA 06/15/2009   Qualifier: Diagnosis of  By: Drue Flirt  MD, [place]     Thrombocytopenia ([place]) 01/30/2014   Tibialis tendinitis 08/12/2009   Qualifier: Diagnosis of  By: Oneida Alar MD, KARL     Tinnitus 11/13/2013   TROCHANTERIC BURSITIS, LEFT 04/27/2010   Qualifier: Diagnosis of  By: Ernestina Patches MD, Alberteen Spindle LEG LENGTH 09/18/2008   Qualifier: Diagnosis of  By: Oneida Alar MD, KARL     Wrist pain, acute, left 04/23/2019   US shows small fracture off tip of lister's tubercle Extensor comp 4 strain    Past Surgical History:  Procedure Laterality Date   ABDOMINAL SURGERY     ABDOMINAL SURGERY     for removal of abdominal mass-cancerous   CATARACT EXTRACTION Bilateral    KNEE SURGERY     MANDIBLE FRACTURE SURGERY     NASAL SINUS SURGERY     TONSILLECTOMY      Current Medications: Current Meds  Medication Sig   albuterol (PROVENTIL HFA) 108 (90 Base)  MCG/ACT inhaler Inhale 2 puffs into the lungs every 6 (six) hours as needed for wheezing.   clopidogrel (PLAVIX) 75 MG tablet Take 1 tablet (75 mg total) by mouth daily.   diltiazem (CARDIZEM CD) 180 MG 24 hr capsule Take 1 capsule (180 mg total) by mouth daily.   loratadine (CLARITIN) 10 MG tablet Take 1 tablet by mouth daily.   nitroGLYCERIN (NITROSTAT) 0.4 MG SL tablet Place 1 tablet (0.4 mg total) under the tongue every 5 (five) minutes as needed for chest pain.   pantoprazole (PROTONIX) 40 MG tablet Take 40 mg  by mouth daily.   rosuvastatin (CRESTOR) 10 MG tablet TAKE 1 TABLET BY MOUTH DAILY. (Patient taking differently: Take 10 mg by mouth daily.)     Allergies:   Acetaminophen, Aspirin, Nsaids, and Pravastatin   Social History   Socioeconomic History   Marital status: Legally Separated    Spouse name: Not on file   Number of children: Not on file   Years of education: Not on file   Highest education level: Not on file  Occupational History   Not on file  Tobacco Use   Smoking status: Never    Passive exposure: Never   Smokeless tobacco: Never  Vaping Use   Vaping Use: Never used  Substance and Sexual Activity   Alcohol use: Yes    Comment: occ glass of wine   Drug use: No   Sexual activity: Not on file  Other Topics Concern   Not on file  Social History Narrative   Not on file   Social Determinants of Health   Financial Resource Strain: Not on file  Food Insecurity: Not on file  Transportation Needs: Not on file  Physical Activity: Not on file  Stress: Not on file  Social Connections: Not on file     Family History: The patient's family history includes COPD in her mother; Diabetes in her father and sister; Parkinson's disease in her father; Pulmonary fibrosis in her father; Thyroid disease in her sister. There is no history of Heart attack. ROS:   Please see the history of present illness.    All other systems reviewed and are negative.  EKGs/Labs/Other  Studies Reviewed:    The following studies were reviewed today:  Recent Lipid Panel 01/29/2021 cholesterol 142 HDL 72 triglycerides 81 LDL 54 non-HDL cholesterol 70 01/27/2022 cholesterol 154 LDL 60 triglycerides 96 HDL 77 non-HDL cholesterol 58 Physical Exam:    VS:  BP 136/78 (BP Location: Left Arm, Patient Position: Sitting)   Pulse 77   Ht 5' (1.524 m)   Wt 144 lb 3.2 oz (65.4 kg)   SpO2 97%   BMI 28.16 kg/m     Wt Readings from Last 3 Encounters:  01/31/22 144 lb 3.2 oz (65.4 kg)  01/26/22 143 lb 9.6 oz (65.1 kg)  10/12/21 142 lb (64.4 kg)     GEN:  Well nourished, well developed in no acute distress HEENT: Normal NECK: No JVD; No carotid bruits LYMPHATICS: No lymphadenopathy CARDIAC: RRR, no murmurs, rubs, gallops RESPIRATORY:  Clear to auscultation without rales, wheezing or rhonchi  ABDOMEN: Soft, non-tender, non-distended MUSCULOSKELETAL:  No edema; No deformity  SKIN: Warm and dry NEUROLOGIC:  Alert and oriented x 3 PSYCHIATRIC:  Normal affect    Signed, Shirlee More, MD  01/31/2022 11:42 AM    [place]

## 2022-01-31 NOTE — Patient Instructions (Signed)
Medication Instructions:  Your physician has recommended you make the following change in your medication:   START: Nitroglycerin 0.4 mg under the tongue every 5 minutes as needed for chest pain  *If you need a refill on your cardiac medications before your next appointment, please call your pharmacy*   Lab Work: None If you have labs (blood work) drawn today and your tests are completely normal, you will receive your results only by: [place] (if you have MyChart) OR A paper copy in the mail If you have any lab test that is abnormal or we need to change your treatment, we will call you to review the results.   Testing/Procedures: None   Follow-Up: At [facility], you and your health needs are our priority.  As part of our continuing mission to provide you with exceptional heart care, we have created designated Provider Care Teams.  These Care Teams include your primary Cardiologist (physician) and Advanced Practice Providers (APPs -  Physician Assistants and Nurse Practitioners) who all work together to provide you with the care you need, when you need it.  We recommend signing up for the patient portal called "MyChart".  Sign up information is provided on this After Visit Summary.  MyChart is used to connect with patients for Virtual Visits (Telemedicine).  Patients are able to view lab/test results, encounter notes, upcoming appointments, etc.  Non-urgent messages can be sent to your provider as well.   To learn more about what you can do with MyChart, go to [email or web address].    Your next appointment:   6 month(s)  The format for your next appointment:   In Person  Provider:   Shirlee More, MD    Other Instructions None  Important Information About Sugar

## 2022-02-11 ENCOUNTER — Encounter: Payer: Self-pay | Admitting: Emergency Medicine

## 2022-03-23 IMAGING — MG DIGITAL SCREENING BILAT W/ TOMO W/ CAD
8 series · 9 of 24 positions shown · non-contrast
Comparison: Previous exam(s).

CLINICAL DATA: Screening.

EXAM:
DIGITAL SCREENING BILATERAL MAMMOGRAM WITH TOMO AND CAD

[L MLO synth-2D]
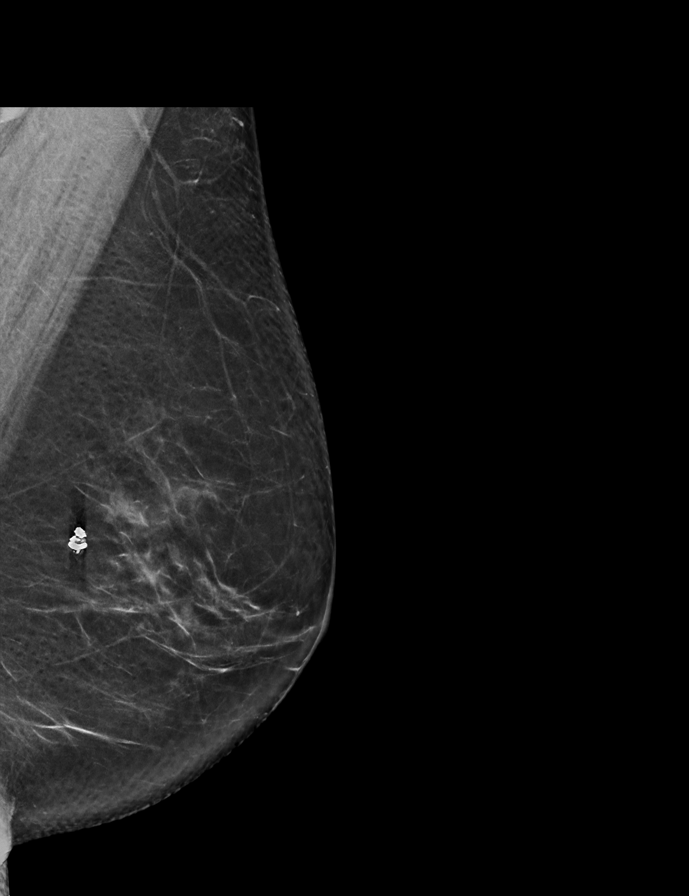

[R CC synth-2D]
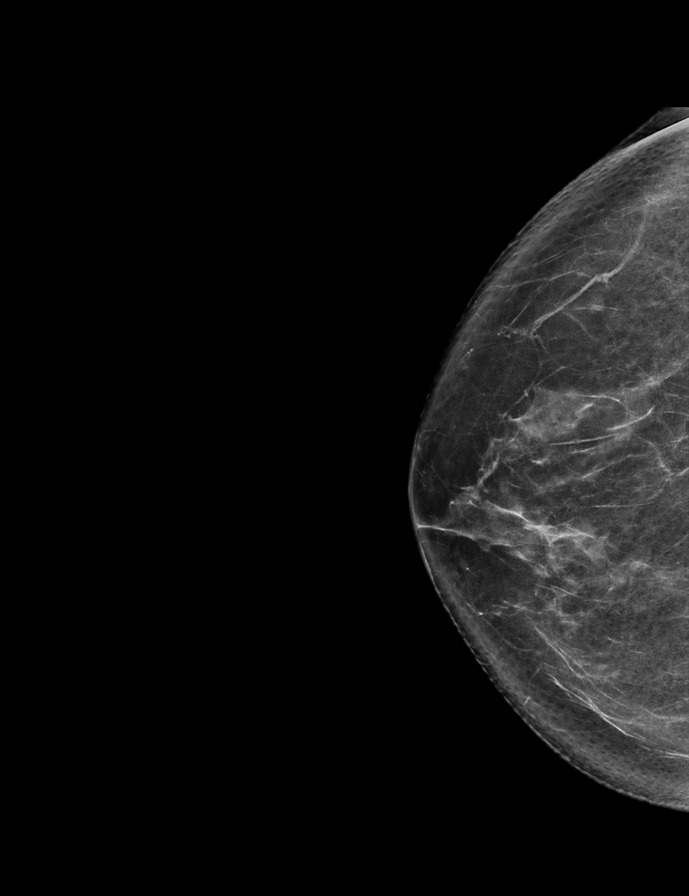

[R MLO synth-2D]
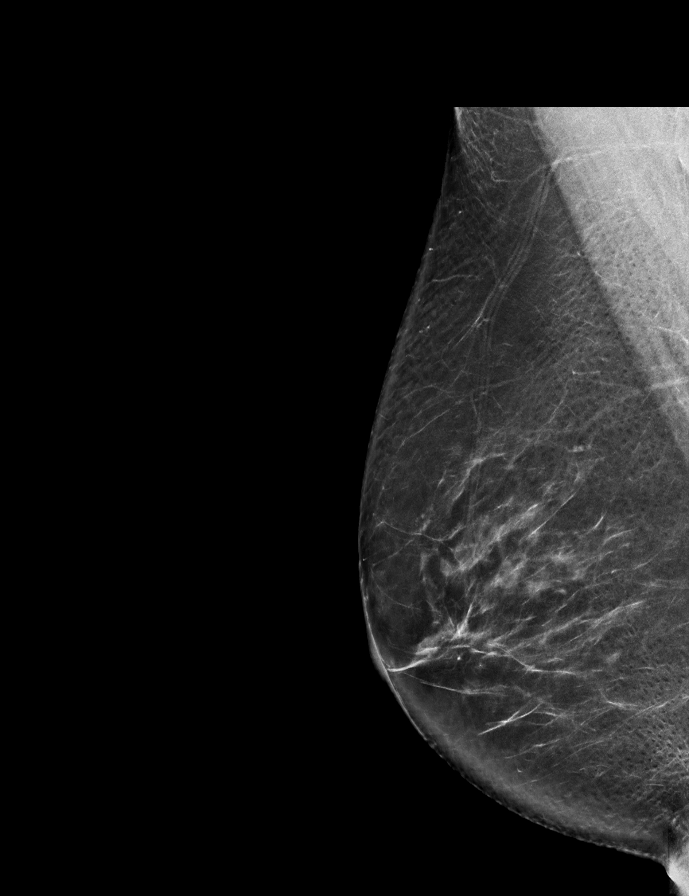

[L CC synth-2D]
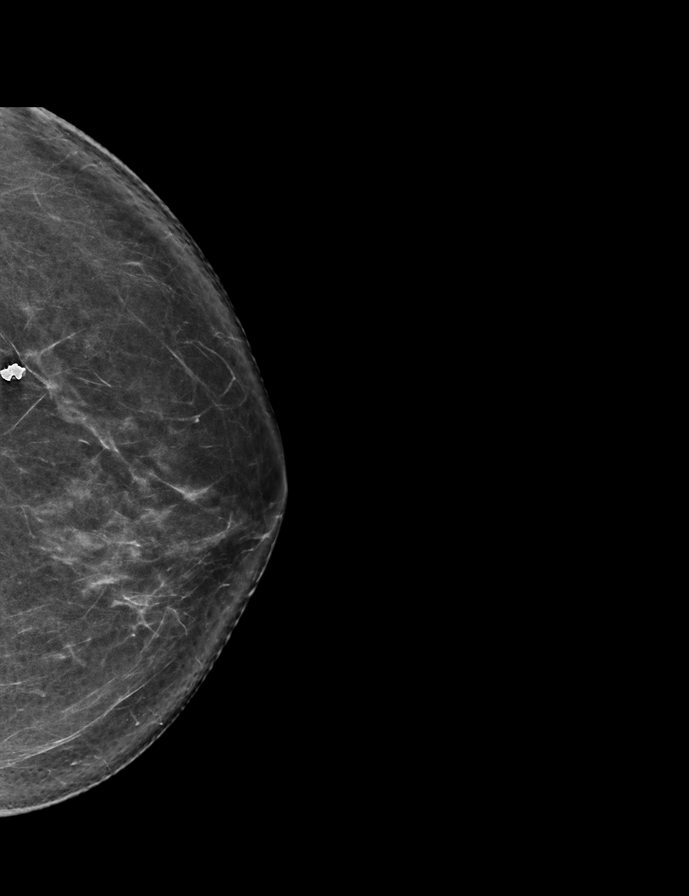

[L MLO tomo · 2 of 76 frames shown]
[frame 25/76]
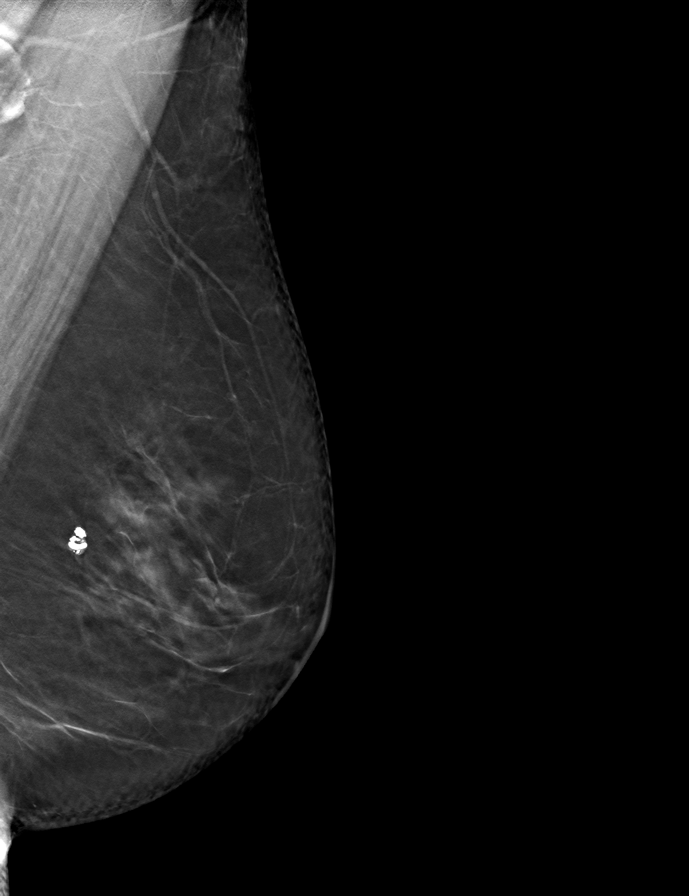
[frame 39/76]
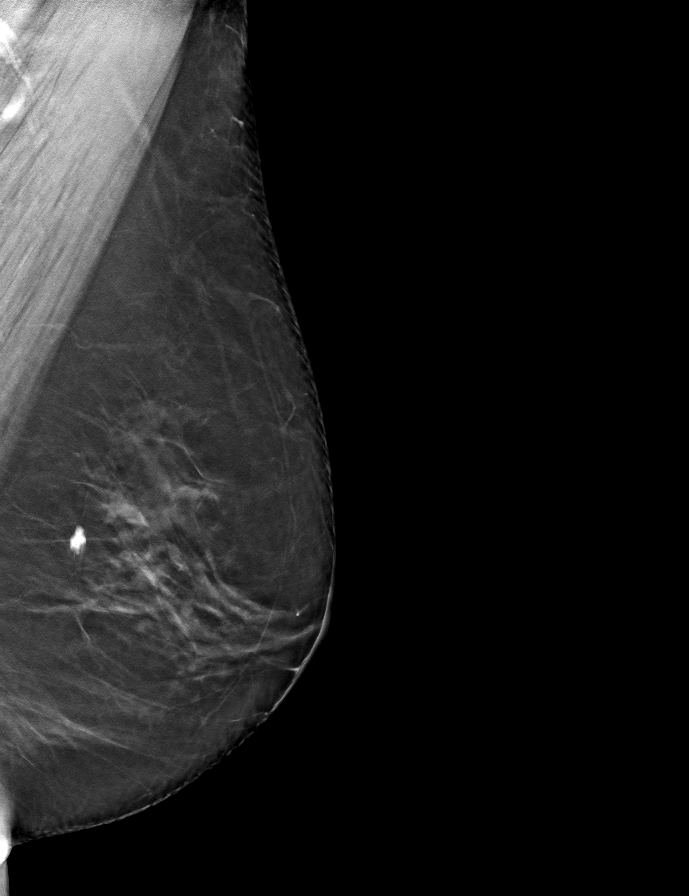

[R MLO tomo · tomo slice 41/80.0]
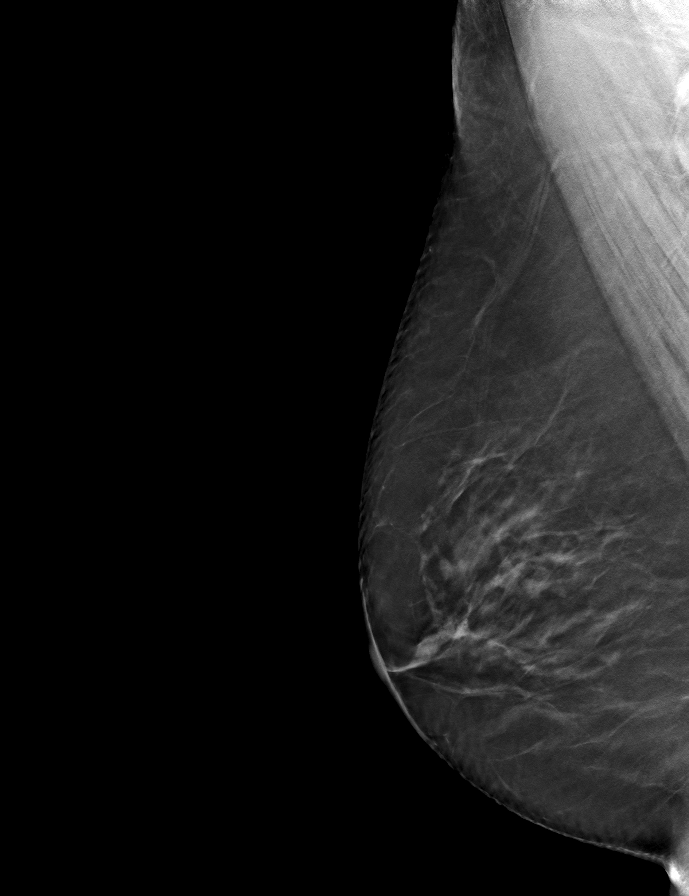

[R CC tomo · tomo slice 37/73.0]
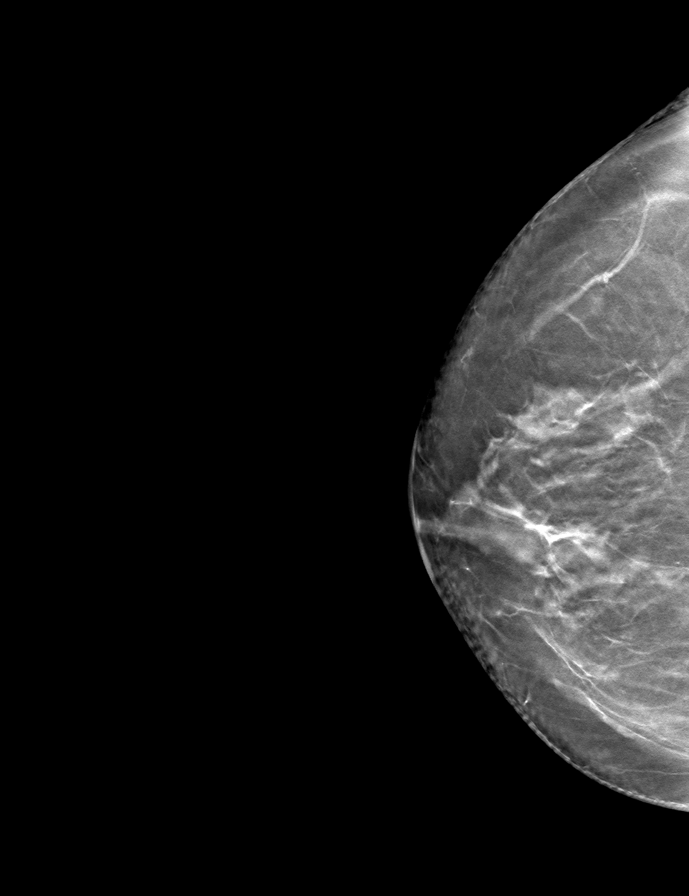

[L CC tomo · tomo slice 37/74.0]
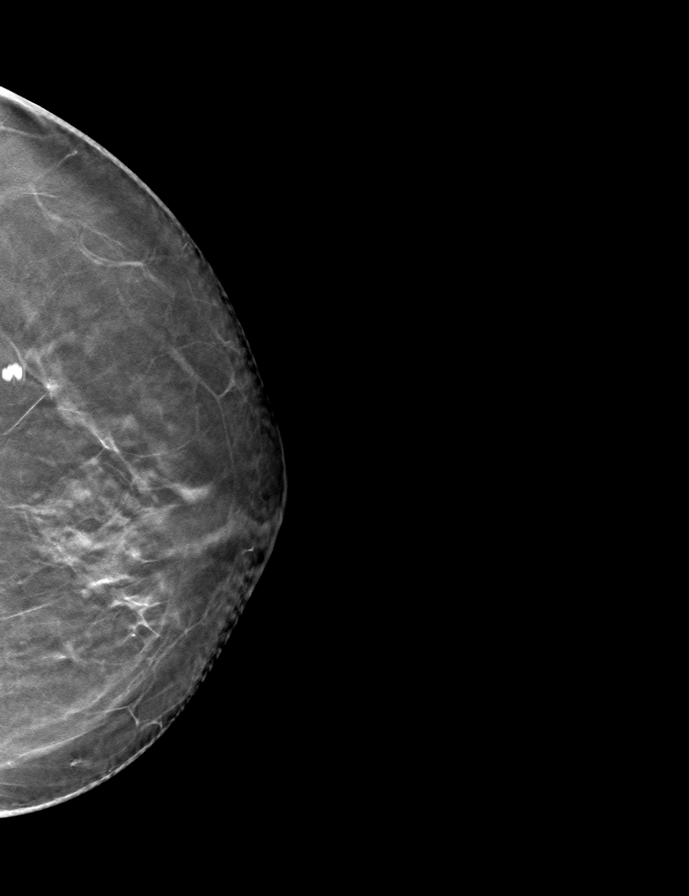

[9 of 24 positions shown; findings below may reference images not displayed]

ACR Breast Density Category b: There are scattered areas of
fibroglandular density.
FINDINGS: There are no findings suspicious for malignancy. Images were
processed with CAD.
IMPRESSION: No mammographic evidence of malignancy. A result letter of this
screening mammogram will be mailed directly to the patient.

RECOMMENDATION:
Screening mammogram in one year. (Code:CN-U-775)

BI-RADS CATEGORY  1: Negative.

## 2022-04-27 ENCOUNTER — Ambulatory Visit: Payer: Medicare PPO | Admitting: Emergency Medicine

## 2022-04-27 ENCOUNTER — Encounter: Payer: Self-pay | Admitting: Emergency Medicine

## 2022-04-27 VITALS — BP 128/78 | HR 71 | Temp 98.4°F | Ht 60.0 in | Wt 145.0 lb

## 2022-04-27 DIAGNOSIS — J301 Allergic rhinitis due to pollen: Secondary | ICD-10-CM

## 2022-04-27 DIAGNOSIS — J4520 Mild intermittent asthma, uncomplicated: Secondary | ICD-10-CM | POA: Diagnosis not present

## 2022-04-27 DIAGNOSIS — K219 Gastro-esophageal reflux disease without esophagitis: Secondary | ICD-10-CM

## 2022-04-27 NOTE — Assessment & Plan Note (Signed)
Pantoprazole once daily.  She is having some extra GERD recently but ascribes this to stress.  We will hold off on increasing the dose at this time.

## 2022-04-27 NOTE — Patient Instructions (Signed)
Keep your albuterol available to use 2 puffs when needed for shortness of breath, chest tightness, wheezing.  Continue to pretreat your exercise with 2 puffs about 15 minutes before workouts. Vaccinations are up-to-date Continue loratadine 10 mg once daily Continue your pantoprazole 40 mg once daily. Follow Dr. Lamonte Sakai in early March 2024, sooner if you have any problems.

## 2022-04-27 NOTE — Assessment & Plan Note (Signed)
She has not been able to do quite as much training for her marathon as she wanted to due to some social stressors.  She does feel that her breathing is stable.  She is working out and plans to do the marathon in March.  She pretreats exercise with albuterol, rarely requires it otherwise.  May have a little extra GERD due to stress but benefits from PPI.  We agreed to continue to follow her respiratory status, functional capacity as she continues to train.  I will see her in March before the marathon.  If she does not feel that she has optimize her function capacity and breathing then we will consider other workup including repeat PFTs, possibly high-resolution CT chest to look for any sequela of her COVID-19 from 06/2021.

## 2022-04-27 NOTE — Assessment & Plan Note (Addendum)
Loratadine as ordered, benefiting

## 2022-04-27 NOTE — Progress Notes (Signed)
Subjective:    Patient ID: Rhonda [name], female    DOB: 14-Jun-1950, 71 y.o.   MRN: [id number]  Asthma There is no cough, shortness of breath or wheezing. Pertinent negatives include no ear pain, fever, headaches, postnasal drip, rhinorrhea, sneezing, sore throat or trouble swallowing. Her past medical history is significant for asthma.    ROV 01/26/22 --[name] is 32 and follows up today for her history of mild intermittent asthma, upper airway irritation with chronic cough in the setting of GERD and rhinitis.  She had COVID-19 in February 23, took a while for her to recoup and regain her functional capacity.  She is training for a marathon in April - has not run for 10 weeks as she has been going thru cataract surgery.  Today she reports that she has done quite well, but she notices some increased wob when humidity is high.  Remains on loratadine, pantoprazole.  She has albuterol, uses rarely. She does pre-treat her runs.  She is interested in flu shot, covid booster, wonders about rsv.    ROV 04/27/22 --follow-up visit 71 year old woman with a history of mild intermittent asthma and upper airway irritation syndrome and chronic cough.  She has GERD and allergic rhinitis.  She had COVID-19 in 06/2021 with some decreased functional capacity after that.  She has been quite active, able to run, trying for marathons etc.  Currently managed on loratadine, pantoprazole, albuterol as needed.  Today she reports that she is back to doing some of her training, not as much as she we would like - a lot of social stressors going on. She uses albuterol before running, really doesn't need it at any other times. She is having a bit more GERD due to stress - still on the PPI. She is still planning to do marathon in March.     Review of Systems  Constitutional:  Negative for fever and unexpected weight change.  HENT:  Negative for congestion, dental problem, ear pain, nosebleeds, postnasal drip, rhinorrhea, sinus  pressure, sneezing, sore throat and trouble swallowing.   Eyes:  Negative for redness and itching.  Respiratory:  Negative for cough, chest tightness, shortness of breath and wheezing.   Cardiovascular:  Negative for palpitations and leg swelling.  Gastrointestinal:  Negative for nausea and vomiting.  Genitourinary:  Negative for dysuria.  Musculoskeletal:  Negative for joint swelling.  Skin:  Negative for rash.  Neurological:  Negative for headaches.  Hematological:  Does not bruise/bleed easily.  Psychiatric/Behavioral:  Negative for dysphoric mood. The patient is not nervous/anxious.        Objective:   Physical Exam Vitals:   04/27/22 1329  BP: 128/78  Pulse: 71  Temp: 98.4 F (36.9 C)  TempSrc: Oral  SpO2: 99%  Weight: 145 lb (65.8 kg)  Height: 5' (1.524 m)     Gen: Pleasant, well-nourished, in no distress,  normal affect  ENT: No lesions,  mouth clear,  oropharynx clear, no postnasal drip  Neck: No JVD, no stridor  Lungs: No use of accessory muscles, no dullness to percussion, clear without rales or rhonchi  Cardiovascular: RRR, heart sounds normal, no murmur or gallops, no peripheral edema  Musculoskeletal: No deformities, no cyanosis or clubbing  Neuro: alert, non focal  Skin: Warm, no lesions or rash      Assessment & Plan:  Mild intermittent asthma without complication She has not been able to do quite as much training for her marathon as she wanted to due to some  social stressors.  She does feel that her breathing is stable.  She is working out and plans to do the marathon in March.  She pretreats exercise with albuterol, rarely requires it otherwise.  May have a little extra GERD due to stress but benefits from PPI.  We agreed to continue to follow her respiratory status, functional capacity as she continues to train.  I will see her in March before the marathon.  If she does not feel that she has optimize her function capacity and breathing then we will  consider other workup including repeat PFTs, possibly high-resolution CT chest to look for any sequela of her COVID-19 from 06/2021.  Allergic rhinitis Loratadine as ordered, benefiting  Gastroesophageal reflux disease without esophagitis Pantoprazole once daily.  She is having some extra GERD recently but ascribes this to stress.  We will hold off on increasing the dose at this time.  Baltazar Apo, MD, PhD 04/27/2022, 1:44 PM [facility] [phone] or if no answer [phone]

## 2022-05-21 ENCOUNTER — Encounter: Payer: Self-pay | Admitting: Emergency Medicine

## 2022-05-23 NOTE — Telephone Encounter (Signed)
Mychart message sent by pt: Rhonda [name] "Rhonda [name] [facility] (supporting Collene Gobble, MD)2 days ago    Hello Dr Lamonte Sakai,  hope you are having a good weekend.   The week between Christmas and New Year's, I managed to come down with a severe sinus-respiratory infection.  I saw my primary care physician and was prescribed an antibiotic and prednisone. Took them for the week and it knocked out the sinus issues however, whatever creeping crud I had managed to travel into my lungs.  I had severe coughing and congestion and trouble breathing.  They did a chest x-ray and it looked "OK".  But I was still struggling to breathe, and my lungs were hurting, and it was only getting worse.  Saw my primary physician yesterday and he prescribed Symbicort 160/4.5 for me to use 2 puffs 2 times a day for 10 days to help with the inflammation and my breathing.     I wanted you to know he prescribed this for me for the next 10 days since you are my pulmonologist.  If you think this will be an issue, let me know.  Otherwise, I will continue with the Symbicort to get rid of this respiratory problem I currently have.   Have a nice weekend.    Thanks, Rhonda [name]   Routing to Dr. Lamonte Sakai for review.

## 2022-05-24 NOTE — Telephone Encounter (Signed)
She need to be seen in our office as an acute visit, needs an appointment with APP or RB.  If she still has it she can continue the Symbicort 2 puffs twice a day until we are able to see her and decide whether to continue.

## 2022-05-25 ENCOUNTER — Ambulatory Visit: Payer: Medicare PPO | Admitting: Emergency Medicine

## 2022-05-25 ENCOUNTER — Encounter: Payer: Self-pay | Admitting: Emergency Medicine

## 2022-05-25 VITALS — BP 126/74 | HR 54 | Temp 98.1°F | Ht 60.0 in | Wt 146.8 lb

## 2022-05-25 DIAGNOSIS — J4520 Mild intermittent asthma, uncomplicated: Secondary | ICD-10-CM | POA: Diagnosis not present

## 2022-05-25 DIAGNOSIS — J301 Allergic rhinitis due to pollen: Secondary | ICD-10-CM | POA: Diagnosis not present

## 2022-05-25 DIAGNOSIS — K219 Gastro-esophageal reflux disease without esophagitis: Secondary | ICD-10-CM | POA: Diagnosis not present

## 2022-05-25 DIAGNOSIS — R059 Cough, unspecified: Secondary | ICD-10-CM | POA: Insufficient documentation

## 2022-05-25 DIAGNOSIS — R052 Subacute cough: Secondary | ICD-10-CM | POA: Diagnosis not present

## 2022-05-25 NOTE — Progress Notes (Signed)
Subjective:    Patient ID: Rhonda [name], female    DOB: November 19, 1950, 72 y.o.   MRN: [id number]  Asthma There is no cough, shortness of breath or wheezing. Pertinent negatives include no ear pain, fever, headaches, postnasal drip, rhinorrhea, sneezing, sore throat or trouble swallowing. Her past medical history is significant for asthma.     ROV 04/27/22 --follow-up visit 72 year old woman with a history of mild intermittent asthma and upper airway irritation syndrome and chronic cough.  She has GERD and allergic rhinitis.  She had COVID-19 in 06/2021 with some decreased functional capacity after that.  She has been quite active, able to run, trying for marathons etc.  Currently managed on loratadine, pantoprazole, albuterol as needed.  Today she reports that she is back to doing some of her training, not as much as she we would like - a lot of social stressors going on. She uses albuterol before running, really doesn't need it at any other times. She is having a bit more GERD due to stress - still on the PPI. She is still planning to do marathon in March.    Acute OV 05/25/22 --72 year old woman whom I follow for mild intermittent asthma and upper airway irritation syndrome with chronic cough, both impacted by GERD and allergic rhinitis.  She had COVID-19 in 06/2021.  She has been overall improved, continues to train and do her running.  She pretreats her training with albuterol.  Has been on loratadine and pantoprazole.  She developed a viral upper respiratory infection and then subsequently sinusitis/bronchitis at the end of December.  COVID and flu testing were both negative.  She was treated with prednisone 40 mg daily for 5 days and azithromycin, codeine cough syrup.  She continued to experience chest symptoms and was started on Symbicort temporarily, but only took it once because she was worried about side effects.  She has only used her albuterol once. Today she reports that she continues to  have some cough - less than before, non-productive. She feels some pressure in her mid chest. Has not been running.    Review of Systems  Constitutional:  Negative for fever and unexpected weight change.  HENT:  Negative for congestion, dental problem, ear pain, nosebleeds, postnasal drip, rhinorrhea, sinus pressure, sneezing, sore throat and trouble swallowing.   Eyes:  Negative for redness and itching.  Respiratory:  Negative for cough, chest tightness, shortness of breath and wheezing.   Cardiovascular:  Negative for palpitations and leg swelling.  Gastrointestinal:  Negative for nausea and vomiting.  Genitourinary:  Negative for dysuria.  Musculoskeletal:  Negative for joint swelling.  Skin:  Negative for rash.  Neurological:  Negative for headaches.  Hematological:  Does not bruise/bleed easily.  Psychiatric/Behavioral:  Negative for dysphoric mood. The patient is not nervous/anxious.        Objective:   Physical Exam Vitals:   05/25/22 1307  BP: 126/74  Pulse: (!) 54  Temp: 98.1 F (36.7 C)  TempSrc: Oral  SpO2: 98%  Weight: 146 lb 12.8 oz (66.6 kg)  Height: 5' (1.524 m)     Gen: Pleasant, well-nourished, in no distress,  normal affect  ENT: No lesions,  mouth clear,  oropharynx clear, no postnasal drip  Neck: No JVD, no stridor  Lungs: No use of accessory muscles, no dullness to percussion, clear without rales or rhonchi  Cardiovascular: RRR, heart sounds normal, no murmur or gallops, no peripheral edema  Musculoskeletal: No deformities, no cyanosis or clubbing  Neuro:  alert, non focal  Skin: Warm, no lesions or rash      Assessment & Plan:  Mild intermittent asthma without complication Treated for acute flare in setting of URI and suspected bronchitis.  Her lungs are clear today without any wheezing.  No indication to continue the Symbicort.  Continue albuterol as needed  Cough Continued cough after treatment for acute bronchitis and flare.  With clear  lungs, throat clearing, dry cough, suspect that she has persistent upper airway irritation lingering.  She is on a PPI, loratadine.  Consider treating her with another short burst of prednisone to see if we can help with upper airway inflammation.  She would like to avoid this if possible.  Hold off for now.  May need to reconsider if cough persists.  She will avoid throat clearing, use cough drops, cough suppression as needed.  Gastroesophageal reflux disease without esophagitis Continue PPI as ordered  Allergic rhinitis Continue loratadine as ordered  Baltazar Apo, MD, PhD 05/25/2022, 1:27 PM [facility] [phone] or if no answer [phone]

## 2022-05-25 NOTE — Assessment & Plan Note (Signed)
Continue loratadine as ordered 

## 2022-05-25 NOTE — Patient Instructions (Signed)
Agree with stopping Symbicort at this time Keep your albuterol available to use 2 puffs if needed for shortness of breath, spells of coughing, wheezing.  Continue to use 2 puffs prior to exercise Continue your pantoprazole and loratadine as you have been taking them. Okay to use your codeine cough syrup as needed for cough suppression We discussed possibly starting another short course of prednisone to help with your upper airway irritation.  We will hold off for now but could reconsider depending on how your symptoms improve Try to use sugar-free candy or none mentholated cough drops to avoid throat clearing.  When he has the urge to clear your throat, just swallow. Follow in March as planned, sooner if you have any problems.

## 2022-05-25 NOTE — Assessment & Plan Note (Signed)
Treated for acute flare in setting of URI and suspected bronchitis.  Her lungs are clear today without any wheezing.  No indication to continue the Symbicort.  Continue albuterol as needed

## 2022-05-25 NOTE — Assessment & Plan Note (Signed)
Continue PPI as ordered

## 2022-05-25 NOTE — Assessment & Plan Note (Signed)
Continued cough after treatment for acute bronchitis and flare.  With clear lungs, throat clearing, dry cough, suspect that she has persistent upper airway irritation lingering.  She is on a PPI, loratadine.  Consider treating her with another short burst of prednisone to see if we can help with upper airway inflammation.  She would like to avoid this if possible.  Hold off for now.  May need to reconsider if cough persists.  She will avoid throat clearing, use cough drops, cough suppression as needed.

## 2022-06-03 ENCOUNTER — Encounter: Payer: Self-pay | Admitting: Emergency Medicine

## 2022-06-03 NOTE — Telephone Encounter (Signed)
Mychart message sent by pt: Rhonda [name] "[name]"  P [facility] (supporting Collene Gobble, MD)2 minutes ago (3:00 PM)    Hello Dr Lamonte Sakai, hope you are doing well.   I wanted to give you an update since my visit last week.   I used the Ventolin inhaler 3 times a day as we discussed for 3 days - on the 4th day I didn't feel as if it was needed as I continued to improve each day.  I am feeling lots better and cough has pretty much ceased.  Still a little tired but I believe that's because i haven't been able to get out as I would like to for exercise.  I don't believe the additional course of Prednisone is needed.   With the horrible rain we've had for the past 4 days, I haven't been able to get out, but tomorrow is [place] and I plan to start running again tomorrow (yea!) with normal pretreatment of Ventolin before the run.     I will follow up and make the discussed visit in March once your schedule has opened and appointment can be made.   Hope you have a good weekend. Thanks, [name]    Routing to Dr. Lamonte Sakai for review.

## 2022-06-06 ENCOUNTER — Other Ambulatory Visit (HOSPITAL_COMMUNITY): Payer: Self-pay

## 2022-06-08 ENCOUNTER — Other Ambulatory Visit (HOSPITAL_COMMUNITY): Payer: Self-pay

## 2022-06-22 ENCOUNTER — Encounter: Payer: Self-pay | Admitting: Emergency Medicine

## 2022-06-23 NOTE — Telephone Encounter (Signed)
Dr. Lamonte Sakai, please advise on pt's message regarding an upcoming trip with hiking [place] elev[place]. Thanks.

## 2022-07-15 ENCOUNTER — Encounter: Payer: Self-pay | Admitting: Emergency Medicine

## 2022-07-15 ENCOUNTER — Ambulatory Visit: Payer: Medicare PPO | Admitting: Emergency Medicine

## 2022-07-15 VITALS — BP 122/68 | HR 66 | Temp 98.3°F | Ht 60.0 in | Wt 147.0 lb

## 2022-07-15 DIAGNOSIS — J4520 Mild intermittent asthma, uncomplicated: Secondary | ICD-10-CM

## 2022-07-15 DIAGNOSIS — J301 Allergic rhinitis due to pollen: Secondary | ICD-10-CM | POA: Diagnosis not present

## 2022-07-15 DIAGNOSIS — K219 Gastro-esophageal reflux disease without esophagitis: Secondary | ICD-10-CM

## 2022-07-15 NOTE — Assessment & Plan Note (Signed)
I am very pleased to hear that your breathing is doing better. Please continue to keep your albuterol available to use 2 puffs when needed for shortness of breath, chest tightness, wheezing.  Also continue to use it prior to exercise if it is helpful Keep your flu shot, COVID-19 vaccine up-to-date We can talk about possibly using acetazolamide to prevent altitude sickness before you make your trip to [place] next year Follow Dr. Lamonte Sakai in 6 months or sooner if you have any problems.

## 2022-07-15 NOTE — Progress Notes (Signed)
Subjective:    Patient ID: Rhonda [name], female    DOB: Apr 26, 1951, 72 y.o.   MRN: [id number]  Asthma There is no cough, shortness of breath or wheezing. Pertinent negatives include no ear pain, fever, headaches, postnasal drip, rhinorrhea, sneezing, sore throat or trouble swallowing. Her past medical history is significant for asthma.     ROV 04/27/22 --follow-up visit 72 year old woman with a history of mild intermittent asthma and upper airway irritation syndrome and chronic cough.  She has GERD and allergic rhinitis.  She had COVID-19 in 06/2021 with some decreased functional capacity after that.  She has been quite active, able to run, trying for marathons etc.  Currently managed on loratadine, pantoprazole, albuterol as needed.  Today she reports that she is back to doing some of her training, not as much as she we would like - a lot of social stressors going on. She uses albuterol before running, really doesn't need it at any other times. She is having a bit more GERD due to stress - still on the PPI. She is still planning to do marathon in March.    Acute OV 05/25/22 --72 year old woman whom I follow for mild intermittent asthma and upper airway irritation syndrome with chronic cough, both impacted by GERD and allergic rhinitis.  She had COVID-19 in 06/2021.  She has been overall improved, continues to train and do her running.  She pretreats her training with albuterol.  Has been on loratadine and pantoprazole.  She developed a viral upper respiratory infection and then subsequently sinusitis/bronchitis at the end of December.  COVID and flu testing were both negative.  She was treated with prednisone 40 mg daily for 5 days and azithromycin, codeine cough syrup.  She continued to experience chest symptoms and was started on Symbicort temporarily, but only took it once because she was worried about side effects.  She has only used her albuterol once. Today she reports that she continues to  have some cough - less than before, non-productive. She feels some pressure in her mid chest. Has not been running.    ROV 07/15/22 --Rhonda [name] is 60, follows up today for mild intermittent asthma, chronic cough with upper airway irritation syndrome.  She has GERD and allergic rhinitis which are contributors.  I saw her a month ago after she was treated for flare in December.  I stopped her Symbicort, continued her pantoprazole and loratadine.  Today she reports that she is doing very well. Cough and breathing are both better - she now notes that she was being significantly impacted by her stress levels. Her [place] trip was delayed, she is going next year. She is pre-treating her exercise w albuterol.    Review of Systems  Constitutional:  Negative for fever and unexpected weight change.  HENT:  Negative for congestion, dental problem, ear pain, nosebleeds, postnasal drip, rhinorrhea, sinus pressure, sneezing, sore throat and trouble swallowing.   Eyes:  Negative for redness and itching.  Respiratory:  Negative for cough, chest tightness, shortness of breath and wheezing.   Cardiovascular:  Negative for palpitations and leg swelling.  Gastrointestinal:  Negative for nausea and vomiting.  Genitourinary:  Negative for dysuria.  Musculoskeletal:  Negative for joint swelling.  Skin:  Negative for rash.  Neurological:  Negative for headaches.  Hematological:  Does not bruise/bleed easily.  Psychiatric/Behavioral:  Negative for dysphoric mood. The patient is not nervous/anxious.        Objective:   Physical Exam Vitals:   07/15/22  1331  BP: 122/68  [name]: 66  Temp: 98.3 F (36.8 C)  TempSrc: Oral  SpO2: 99%  Weight: 147 lb (66.7 kg)  Height: 5' (1.524 m)     Gen: Pleasant, well-nourished, in no distress,  normal affect  ENT: No lesions,  mouth clear,  oropharynx clear, no postnasal drip  Neck: No JVD, no stridor  Lungs: No use of accessory muscles, no dullness to percussion, clear  without rales or rhonchi  Cardiovascular: RRR, heart sounds normal, no murmur or gallops, no peripheral edema  Musculoskeletal: No deformities, no cyanosis or clubbing  Neuro: alert, non focal  Skin: Warm, no lesions or rash      Assessment & Plan:  Mild intermittent asthma without complication I am very pleased to hear that your breathing is doing better. Please continue to keep your albuterol available to use 2 puffs when needed for shortness of breath, chest tightness, wheezing.  Also continue to use it prior to exercise if it is helpful Keep your flu shot, COVID-19 vaccine up-to-date We can talk about possibly using acetazolamide to prevent altitude sickness before you make your trip to [place] next year Follow Dr. Lamonte Sakai in 6 months or sooner if you have any problems.  Allergic rhinitis Continue your loratadine as you have been taking it  Gastroesophageal reflux disease without esophagitis Continue your Protonix as you have been taking  Baltazar Apo, MD, PhD 07/15/2022, 1:48 PM [facility] [phone] or if no answer [phone]

## 2022-07-15 NOTE — Assessment & Plan Note (Signed)
Continue your Protonix as you have been taking

## 2022-07-15 NOTE — Assessment & Plan Note (Signed)
Continue your loratadine as you have been taking it

## 2022-07-15 NOTE — Patient Instructions (Addendum)
I am very pleased to hear that your breathing is doing better. Please continue to keep your albuterol available to use 2 puffs when needed for shortness of breath, chest tightness, wheezing.  Also continue to use it prior to exercise if it is helpful Continue your Protonix as you have been taking Continue your loratadine as you have been taking it Keep your flu shot, COVID-19 vaccine up-to-date We can talk about possibly using acetazolamide to prevent altitude sickness before you make your trip to [place] next year Follow Dr. Lamonte Sakai in 6 months or sooner if you have any problems.

## 2022-08-04 ENCOUNTER — Ambulatory Visit: Payer: Medicare PPO | Admitting: Cardiology

## 2022-08-24 ENCOUNTER — Other Ambulatory Visit: Payer: Self-pay | Admitting: Cardiology

## 2022-08-24 NOTE — Telephone Encounter (Signed)
Clopidogrel 75 mg # 90 only , patient needs appointment for future refills / 1st attempt

## 2023-02-01 ENCOUNTER — Encounter: Payer: Self-pay | Admitting: Emergency Medicine

## 2023-02-01 ENCOUNTER — Ambulatory Visit: Payer: Medicare PPO | Admitting: Emergency Medicine

## 2023-02-01 VITALS — BP 120/68 | HR 69 | Ht 60.0 in | Wt 141.4 lb

## 2023-02-01 DIAGNOSIS — J4520 Mild intermittent asthma, uncomplicated: Secondary | ICD-10-CM | POA: Diagnosis not present

## 2023-02-01 DIAGNOSIS — J301 Allergic rhinitis due to pollen: Secondary | ICD-10-CM

## 2023-02-01 DIAGNOSIS — K219 Gastro-esophageal reflux disease without esophagitis: Secondary | ICD-10-CM | POA: Diagnosis not present

## 2023-02-01 NOTE — Patient Instructions (Signed)
Please continue to use your albuterol prior to exercise.  Start using 3 puffs of your current prescription about 15 minutes prior to your runs.  I think you need to keep the albuterol available during your runs in your future marathon.  Good luck! Continue your pantoprazole and your loratadine as you have been taking them Follow with Dr Delton Coombes in 6 months or sooner if you have any problems

## 2023-02-01 NOTE — Assessment & Plan Note (Signed)
Overall stable.  No flares.  She has a new albuterol formulation and is unsure whether it is as strong as her prior.  She has noticed some evidence for air trapping when she does her training runs, gets relief from hyperinflation when she stops to rest.  She is also noted that the albuterol does not last as long.  Okay for her to try using 3 puffs prior to exertion of this formulation to see if we can reproduce the effects of her previous inhaler.  Please continue to use your albuterol prior to exercise.  Start using 3 puffs of your current prescription about 15 minutes prior to your runs.  I think you need to keep the albuterol available during your runs in your future marathon.  Good luck! Continue your pantoprazole and your loratadine as you have been taking them Follow with Dr Delton Coombes in 6 months or sooner if you have any problems

## 2023-02-01 NOTE — Progress Notes (Signed)
Subjective:    Patient ID: Rhonda [name], female    DOB: 1950/11/30, 72 y.o.   MRN: [id number]  Asthma There is no cough, shortness of breath or wheezing. Pertinent negatives include no ear pain, fever, headaches, postnasal drip, rhinorrhea, sneezing, sore throat or trouble swallowing. Her past medical history is significant for asthma.     ROV 07/15/22 --Olegario Messier is 48, follows up today for mild intermittent asthma, chronic cough with upper airway irritation syndrome.  She has GERD and allergic rhinitis which are contributors.  I saw her a month ago after she was treated for flare in December.  I stopped her Symbicort, continued her pantoprazole and loratadine.  Today she reports that she is doing very well. Cough and breathing are both better - she now notes that she was being significantly impacted by her stress levels. Her [place] trip was delayed, she is going next year. She is pre-treating her exercise w albuterol.   ROV 02/01/2023 --follow-up visit for [facility].  She is 64 with mild intermittent asthma, chronic cough and upper airway irritation syndrome.  She has GERD and allergic rhinitis.  She has been managed on loratadine, pantoprazole, albuterol She is off diltiazem, has changed her plavix to every other day. She reports that her breathing has been pretty good. She is able to exert, run. She pretreats her exercise with albuterol. Most recently she got a generic albuterol that she thinks may not be as strong.    Review of Systems  Constitutional:  Negative for fever and unexpected weight change.  HENT:  Negative for congestion, dental problem, ear pain, nosebleeds, postnasal drip, rhinorrhea, sinus pressure, sneezing, sore throat and trouble swallowing.   Eyes:  Negative for redness and itching.  Respiratory:  Negative for cough, chest tightness, shortness of breath and wheezing.   Cardiovascular:  Negative for palpitations and leg swelling.  Gastrointestinal:  Negative for nausea and  vomiting.  Genitourinary:  Negative for dysuria.  Musculoskeletal:  Negative for joint swelling.  Skin:  Negative for rash.  Neurological:  Negative for headaches.  Hematological:  Does not bruise/bleed easily.  Psychiatric/Behavioral:  Negative for dysphoric mood. The patient is not nervous/anxious.        Objective:   Physical Exam Vitals:   02/01/23 1155  BP: 120/68  Pulse: 69  SpO2: 99%  Weight: 141 lb 6.4 oz (64.1 kg)  Height: 5' (1.524 m)     Gen: Pleasant, well-nourished, in no distress,  normal affect  ENT: No lesions,  mouth clear,  oropharynx clear, no postnasal drip  Neck: No JVD, no stridor  Lungs: No use of accessory muscles, no dullness to percussion, clear without rales or rhonchi  Cardiovascular: RRR, heart sounds normal, no murmur or gallops, no peripheral edema  Musculoskeletal: No deformities, no cyanosis or clubbing  Neuro: alert, non focal  Skin: Warm, no lesions or rash      Assessment & Plan:  Mild intermittent asthma without complication Overall stable.  No flares.  She has a new albuterol formulation and is unsure whether it is as strong as her prior.  She has noticed some evidence for air trapping when she does her training runs, gets relief from hyperinflation when she stops to rest.  She is also noted that the albuterol does not last as long.  Okay for her to try using 3 puffs prior to exertion of this formulation to see if we can reproduce the effects of her previous inhaler.  Please continue to use your  albuterol prior to exercise.  Start using 3 puffs of your current prescription about 15 minutes prior to your runs.  I think you need to keep the albuterol available during your runs in your future marathon.  Good luck! Continue your pantoprazole and your loratadine as you have been taking them Follow with Dr Delton Coombes in 6 months or sooner if you have any problems  Allergic rhinitis Continue loratadine as ordered  Gastroesophageal reflux  disease without esophagitis Continue pantoprazole as ordered   Levy Pupa, MD, PhD 02/01/2023, 4:38 PM [facility] [phone] or if no answer [phone]

## 2023-02-01 NOTE — Assessment & Plan Note (Signed)
Continue loratadine as ordered

## 2023-02-01 NOTE — Assessment & Plan Note (Signed)
Continue pantoprazole as ordered

## 2023-03-09 ENCOUNTER — Encounter: Payer: Self-pay | Admitting: Emergency Medicine

## 2023-03-11 NOTE — Telephone Encounter (Signed)
Dr. Delton Coombes, please see mychart sent by pt and advise.

## 2023-03-13 NOTE — Telephone Encounter (Signed)
Thank you for this update. I am sorry that we couldn;t get through the marathon. I think it would be reasonable to see if we could get you into our office sooner than December - could set you up with one of our APP

## 2023-04-12 ENCOUNTER — Ambulatory Visit: Payer: Medicare PPO | Admitting: Emergency Medicine

## 2023-07-12 ENCOUNTER — Ambulatory Visit: Payer: Medicare PPO | Admitting: Emergency Medicine

## 2023-07-12 ENCOUNTER — Encounter: Payer: Self-pay | Admitting: Emergency Medicine

## 2023-07-12 VITALS — BP 126/82 | HR 66 | Ht 66.0 in | Wt 146.2 lb

## 2023-07-12 DIAGNOSIS — J4520 Mild intermittent asthma, uncomplicated: Secondary | ICD-10-CM | POA: Diagnosis not present

## 2023-07-12 NOTE — Assessment & Plan Note (Signed)
 Overall her asthma has been well-controlled, no flares.  Her functional capacity and stamina are both down because she been dealing with other issues including her TIA, B12 deficiency.  She is about to get back to her usual exercise routine.  I did advise her that she should not get frustrated if she needs to take some time to get back to her previous functional capacity.  She will continue to pretreat her running with albuterol.  Agree with working to carefully get back to your exercise routine. Continue to pretreat your exercise with albuterol as you have been doing. Please let us know if you develop any new respiratory symptoms, worsening of your breathing. Flu shot and COVID-19 vaccine are both up-to-date You can consider getting the RSV vaccine at some point in the future. Follow-up with APP in 6 months Follow Dr. Delton Coombes in 12 months or sooner if you have any problems.

## 2023-07-12 NOTE — Progress Notes (Signed)
 Subjective:    Patient ID: Rhonda [name], female    DOB: 1951-02-13, 73 y.o.   MRN: [id number]  Asthma There is no cough, shortness of breath or wheezing. Pertinent negatives include no ear pain, fever, headaches, postnasal drip, rhinorrhea, sneezing, sore throat or trouble swallowing. Her past medical history is significant for asthma.    ROV 02/01/2023 --follow-up visit for [facility]  She is 73 with mild intermittent asthma, chronic cough and upper airway irritation syndrome.  She has GERD and allergic rhinitis.  She has been managed on loratadine, pantoprazole, albuterol She is off diltiazem, has changed her plavix to every other day. She reports that her breathing has been pretty good. She is able to exert, run. She pretreats her exercise with albuterol. Most recently she got a generic albuterol that she thinks may not be as strong.   ROV 07/12/2023 --follow-up visit 73 year old woman with intermittent asthma, chronic cough and upper airway irritation syndrome in the setting of her asthma plus GERD and allergic rhinitis. 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 She has residual word-finding difficulty, L inner ear issues.  Her asthma is  She is on loratidine, has albuterol but has not needed it - used to use it w exercise / running. She is about to be cleared to run again. Flu and covid shots are up to date.    Review of Systems  Constitutional:  Negative for fever and unexpected weight change.  HENT:  Negative for congestion, dental problem, ear pain, nosebleeds, postnasal drip, rhinorrhea, sinus pressure, sneezing, sore throat and trouble swallowing.   Eyes:  Negative for redness and itching.  Respiratory:  Negative for cough, chest tightness, shortness of breath and wheezing.   Cardiovascular:  Negative for palpitations and leg swelling.  Gastrointestinal:  Negative for nausea and vomiting.  Genitourinary:  Negative for dysuria.   Musculoskeletal:  Negative for joint swelling.  Skin:  Negative for rash.  Neurological:  Negative for headaches.  Hematological:  Does not bruise/bleed easily.  Psychiatric/Behavioral:  Negative for dysphoric mood. The patient is not nervous/anxious.        Objective:   Physical Exam Vitals:   07/12/23 1138  BP: 126/82  Pulse: 66  SpO2: 98%  Weight: 146 lb 3.2 oz (66.3 kg)  Height: 5\' 6"  (1.676 m)     Gen: Pleasant, well-nourished, in no distress,  normal affect  ENT: No lesions,  mouth clear,  oropharynx clear, no postnasal drip  Neck: No JVD, no stridor  Lungs: No use of accessory muscles, no dullness to percussion, clear without rales or rhonchi  Cardiovascular: RRR, heart sounds normal, no murmur or gallops, no peripheral edema  Musculoskeletal: No deformities, no cyanosis or clubbing  Neuro: alert, non focal  Skin: Warm, no lesions or rash      Assessment & Plan:  Mild intermittent asthma without complication Overall her asthma has been well-controlled, no flares.  Her functional capacity and stamina are both down because she been dealing with other issues including her TIA, B12 deficiency.  She is about to get back to her usual exercise routine.  I did advise her that she should not get frustrated if she needs to take some time to get back to her previous functional capacity.  She will continue to pretreat her running with albuterol.  Agree with working to carefully get back to your exercise routine. Continue to pretreat your exercise with albuterol as you have been doing. Please  let us know if you develop any new respiratory symptoms, worsening of your breathing. Flu shot and COVID-19 vaccine are both up-to-date You can consider getting the RSV vaccine at some point in the future. Follow-up with APP in 6 months Follow Dr. Delton Coombes in 12 months or sooner if you have any problems.    Levy Pupa, MD, PhD 07/12/2023, 12:03 PM [facility] [phone] or if no answer [phone]

## 2023-07-12 NOTE — Patient Instructions (Addendum)
 Agree with working to carefully get back to your exercise routine. Continue to pretreat your exercise with albuterol as you have been doing. Please let us know if you develop any new respiratory symptoms, worsening of your breathing. Flu shot and COVID-19 vaccine are both up-to-date You can consider getting the RSV vaccine at some point in the future. Follow-up with [facility] in 6 months Follow Dr. Delton Coombes in 12 months or sooner if you have any problems.

## 2024-01-24 ENCOUNTER — Encounter: Payer: Self-pay | Admitting: Emergency Medicine

## 2024-01-24 ENCOUNTER — Ambulatory Visit: Admitting: Emergency Medicine

## 2024-01-24 VITALS — BP 138/68 | HR 69 | Temp 98.0°F | Ht 60.0 in | Wt 147.4 lb

## 2024-01-24 DIAGNOSIS — J4520 Mild intermittent asthma, uncomplicated: Secondary | ICD-10-CM

## 2024-01-24 NOTE — Assessment & Plan Note (Signed)
 Overall her asthma appears to be stable.  She does have new triggers including a lot of stressors.  She has more dyspnea that I think is being impacted by the stress, the family commitments, the need to stop her usual running/workouts, weight gain of 25 pounds.  She understands this.  We talked a lot about trying to carve out time for herself, get back to some degree of exercise probably walking in the mornings.  She is going to commit to this.  I do not think we need to start her on scheduled BD therapy or ICS.  She does keep her albuterol  available to use if needed   Please continue to keep your albuterol  available to use 2 puffs if needed for shortness of breath, chest tightness, wheezing.  You can continue to pretreat your exercise as you have done in the past. Agree with carving out some time for yourself to deal with the stress load and workload of your day.  Try getting up in the morning for a walk as we discussed. Continue your loratadine as you have been taken Follow Dr. Shelah in 3 months, sooner if you have any problems.

## 2024-01-24 NOTE — Patient Instructions (Signed)
 Please continue to keep your albuterol  available to use 2 puffs if needed for shortness of breath, chest tightness, wheezing.  You can continue to pretreat your exercise as you have done in the past. Agree with carving out some time for yourself to deal with the stress load and workload of your day.  Try getting up in the morning for a walk as we discussed. Continue your loratadine as you have been taken Follow Dr. Shelah in 3 months, sooner if you have any problems.

## 2024-01-24 NOTE — Progress Notes (Signed)
 Subjective:    Patient ID: Rhonda [name], female    DOB: Aug 25, 1950, 73 y.o.   MRN: [id number]  Asthma There is no cough, shortness of breath or wheezing. Pertinent negatives include no ear pain, fever, headaches, postnasal drip, rhinorrhea, sneezing, sore throat or trouble swallowing. Her past medical history is significant for asthma.    ROV 02/01/2023 --follow-up visit for [facility].  She is 55 with mild intermittent asthma, chronic cough and upper airway irritation syndrome.  She has GERD and allergic rhinitis.  She has been managed on loratadine, pantoprazole , albuterol  She is off diltiazem , has changed her plavix  to every other day. She reports that her breathing has been pretty good. She is able to exert, run. She pretreats her exercise with albuterol . Most recently she got a generic albuterol  that she thinks may not be as strong.   ROV 07/12/2023 --follow-up visit 73 year old woman with intermittent asthma, chronic cough and upper airway irritation syndrome in the setting of her asthma plus GERD and allergic rhinitis. 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 She has residual word-finding difficulty, L inner ear issues.  Her asthma is  She is on loratidine, has albuterol  but has not needed it - used to use it w exercise / running. She is about to be cleared to run again. Flu and covid shots are up to date.   ROV 01/24/2024 --Rhonda [name] is a 66 with a history of chronic cough and intermittent asthma both impacted by allergic rhinitis and GERD also with a history of B12 deficiency, TIA. Today she reports that it has been a difficult stretch - cares for her her elderly mom, has illness in the family including her sister w pancreatic cancer. 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 She has gained wt (25 lbs), hasn't been doing her running for weeks. Poor sleep also. All of this is impacting her breathing and functional capacity. She can have some intermittent  episodic dyspnea w cough and tightness - resolve on their own. She is not using albuterol  currently. Still on loratadine. Not on PPI right now. She is going to try increasing her exercise in the next week - walking in the am.    Review of Systems  Constitutional:  Negative for fever and unexpected weight change.  HENT:  Negative for congestion, dental problem, ear pain, nosebleeds, postnasal drip, rhinorrhea, sinus pressure, sneezing, sore throat and trouble swallowing.   Eyes:  Negative for redness and itching.  Respiratory:  Negative for cough, chest tightness, shortness of breath and wheezing.   Cardiovascular:  Negative for palpitations and leg swelling.  Gastrointestinal:  Negative for nausea and vomiting.  Genitourinary:  Negative for dysuria.  Musculoskeletal:  Negative for joint swelling.  Skin:  Negative for rash.  Neurological:  Negative for headaches.  Hematological:  Does not bruise/bleed easily.  Psychiatric/Behavioral:  Negative for dysphoric mood. The patient is not nervous/anxious.        Objective:   Physical Exam Vitals:   01/24/24 1155 01/24/24 1156  BP: 130/68 138/68  Pulse: 69 69  Temp:  98 F (36.7 C)  TempSrc:  Oral  SpO2: 98%   Weight:  147 lb 6.4 oz (66.9 kg)  Height:  5' (1.524 m)      Gen: Pleasant, well-nourished, in no distress,  normal affect  ENT: No lesions,  mouth clear,  oropharynx clear, no postnasal drip  Neck: No JVD, no stridor  Lungs: No use of accessory  muscles, no dullness to percussion, clear without rales or rhonchi  Cardiovascular: RRR, heart sounds normal, no murmur or gallops, no peripheral edema  Musculoskeletal: No deformities, no cyanosis or clubbing  Neuro: alert, non focal  Skin: Warm, no lesions or rash      Assessment & Plan:  Mild intermittent asthma without complication Overall her asthma appears to be stable.  She does have new triggers including a lot of stressors.  She has more dyspnea that I think is  being impacted by the stress, the family commitments, the need to stop her usual running/workouts, weight gain of 25 pounds.  She understands this.  We talked a lot about trying to carve out time for herself, get back to some degree of exercise probably walking in the mornings.  She is going to commit to this.  I do not think we need to start her on scheduled BD therapy or ICS.  She does keep her albuterol  available to use if needed   Please continue to keep your albuterol  available to use 2 puffs if needed for shortness of breath, chest tightness, wheezing.  You can continue to pretreat your exercise as you have done in the past. Agree with carving out some time for yourself to deal with the stress load and workload of your day.  Try getting up in the morning for a walk as we discussed. Continue your loratadine as you have been taken Follow Dr. Shelah in 3 months, sooner if you have any problems.   Time spent 30 minutes discussing her symptoms, the contributors, mechanisms and strategies to help with stress, plans to get back into an exercise routine, and her medical regimen.  Lamar Shelah, MD, PhD 01/24/2024, 12:55 PM [facility] [phone] or if no answer [phone]

## 2024-04-11 ENCOUNTER — Ambulatory Visit: Admitting: Emergency Medicine

## 2024-06-05 ENCOUNTER — Ambulatory Visit: Admitting: Emergency Medicine

## 2024-07-26 ENCOUNTER — Ambulatory Visit: Admitting: Emergency Medicine
